# Patient Record
Sex: Female | Born: 1950 | Race: White | Hispanic: No | Marital: Married | Smoking: Current every day smoker
Health system: Southern US, Community
[De-identification: ages and names within clinical notes are randomized; demographics above are authoritative.]

## PROBLEM LIST (undated history)

## (undated) DIAGNOSIS — IMO0001 Reserved for inherently not codable concepts without codable children: Secondary | ICD-10-CM

## (undated) DIAGNOSIS — E785 Hyperlipidemia, unspecified: Secondary | ICD-10-CM

## (undated) DIAGNOSIS — G9340 Encephalopathy, unspecified: Secondary | ICD-10-CM

## (undated) DIAGNOSIS — J449 Chronic obstructive pulmonary disease, unspecified: Secondary | ICD-10-CM

## (undated) DIAGNOSIS — J45909 Unspecified asthma, uncomplicated: Secondary | ICD-10-CM

## (undated) DIAGNOSIS — N390 Urinary tract infection, site not specified: Secondary | ICD-10-CM

## (undated) DIAGNOSIS — S72001A Fracture of unspecified part of neck of right femur, initial encounter for closed fracture: Secondary | ICD-10-CM

## (undated) DIAGNOSIS — I10 Essential (primary) hypertension: Secondary | ICD-10-CM

## (undated) DIAGNOSIS — J40 Bronchitis, not specified as acute or chronic: Secondary | ICD-10-CM

## (undated) DIAGNOSIS — J439 Emphysema, unspecified: Secondary | ICD-10-CM

## (undated) HISTORY — PX: APPENDECTOMY: SHX54

## (undated) HISTORY — PX: INSERTION / PLACEMENT / REVISION NEUROSTIMULATOR: SUR720

## (undated) HISTORY — PX: ABDOMINAL HYSTERECTOMY: SHX81

## (undated) HISTORY — PX: BACK SURGERY: SHX140

---

## 1998-09-22 ENCOUNTER — Other Ambulatory Visit: Admission: RE | Admit: 1998-09-22 | Discharge: 1998-09-22 | Payer: Self-pay | Admitting: Obstetrics and Gynecology

## 2000-01-25 ENCOUNTER — Encounter: Admission: RE | Admit: 2000-01-25 | Discharge: 2000-01-25 | Payer: Self-pay | Admitting: Obstetrics and Gynecology

## 2000-01-25 ENCOUNTER — Encounter: Payer: Self-pay | Admitting: Obstetrics and Gynecology

## 2003-10-14 ENCOUNTER — Emergency Department (HOSPITAL_COMMUNITY): Admission: EM | Admit: 2003-10-14 | Discharge: 2003-10-14 | Payer: Self-pay | Admitting: Emergency Medicine

## 2006-06-05 ENCOUNTER — Emergency Department (HOSPITAL_COMMUNITY): Admission: EM | Admit: 2006-06-05 | Discharge: 2006-06-05 | Payer: Self-pay | Admitting: Emergency Medicine

## 2006-07-29 ENCOUNTER — Encounter: Admission: RE | Admit: 2006-07-29 | Discharge: 2006-07-29 | Payer: Self-pay | Admitting: Orthopedic Surgery

## 2006-09-04 ENCOUNTER — Ambulatory Visit (HOSPITAL_COMMUNITY): Admission: RE | Admit: 2006-09-04 | Discharge: 2006-09-05 | Payer: Self-pay | Admitting: Neurosurgery

## 2006-11-12 ENCOUNTER — Emergency Department (HOSPITAL_COMMUNITY): Admission: EM | Admit: 2006-11-12 | Discharge: 2006-11-12 | Payer: Self-pay | Admitting: Emergency Medicine

## 2007-02-26 ENCOUNTER — Encounter: Admission: RE | Admit: 2007-02-26 | Discharge: 2007-02-26 | Payer: Self-pay | Admitting: Orthopedic Surgery

## 2007-03-11 ENCOUNTER — Inpatient Hospital Stay (HOSPITAL_COMMUNITY): Admission: RE | Admit: 2007-03-11 | Discharge: 2007-03-13 | Payer: Self-pay | Admitting: Neurosurgery

## 2007-06-13 ENCOUNTER — Encounter: Admission: RE | Admit: 2007-06-13 | Discharge: 2007-06-13 | Payer: Self-pay | Admitting: Neurosurgery

## 2007-12-17 ENCOUNTER — Encounter: Admission: RE | Admit: 2007-12-17 | Discharge: 2007-12-17 | Payer: Self-pay | Admitting: Internal Medicine

## 2008-07-19 ENCOUNTER — Encounter: Admission: RE | Admit: 2008-07-19 | Discharge: 2008-07-19 | Payer: Self-pay | Admitting: Orthopedic Surgery

## 2008-07-24 ENCOUNTER — Encounter: Admission: RE | Admit: 2008-07-24 | Discharge: 2008-07-24 | Payer: Self-pay | Admitting: Orthopedic Surgery

## 2008-09-16 ENCOUNTER — Encounter: Admission: RE | Admit: 2008-09-16 | Discharge: 2008-09-16 | Payer: Self-pay | Admitting: Orthopedic Surgery

## 2009-07-24 ENCOUNTER — Encounter: Admission: RE | Admit: 2009-07-24 | Discharge: 2009-07-24 | Payer: Self-pay | Admitting: Orthopedic Surgery

## 2009-08-16 ENCOUNTER — Encounter: Admission: RE | Admit: 2009-08-16 | Discharge: 2009-08-16 | Payer: Self-pay | Admitting: Neurosurgery

## 2009-11-29 ENCOUNTER — Ambulatory Visit: Payer: Self-pay | Admitting: Diagnostic Radiology

## 2009-11-29 ENCOUNTER — Emergency Department (HOSPITAL_BASED_OUTPATIENT_CLINIC_OR_DEPARTMENT_OTHER): Admission: EM | Admit: 2009-11-29 | Discharge: 2009-11-30 | Payer: Self-pay | Admitting: Emergency Medicine

## 2009-12-22 ENCOUNTER — Encounter
Admission: RE | Admit: 2009-12-22 | Discharge: 2010-03-08 | Payer: Self-pay | Source: Home / Self Care | Attending: Family Medicine | Admitting: Family Medicine

## 2010-02-27 ENCOUNTER — Encounter: Payer: Self-pay | Admitting: Internal Medicine

## 2010-03-23 ENCOUNTER — Other Ambulatory Visit: Payer: Self-pay | Admitting: Orthopaedic Surgery

## 2010-03-23 DIAGNOSIS — M549 Dorsalgia, unspecified: Secondary | ICD-10-CM

## 2010-03-25 ENCOUNTER — Ambulatory Visit
Admission: RE | Admit: 2010-03-25 | Discharge: 2010-03-25 | Disposition: A | Discharge status: Home / Self Care | Payer: PRIVATE HEALTH INSURANCE | Source: Ambulatory Visit | Attending: Orthopaedic Surgery | Admitting: Orthopaedic Surgery

## 2010-03-25 DIAGNOSIS — M549 Dorsalgia, unspecified: Secondary | ICD-10-CM

## 2010-04-20 LAB — POCT CARDIAC MARKERS
CKMB, poc: <1 ng/mL — ABNORMAL LOW (ref 1.0–8.0)
Myoglobin, poc: 71.7 ng/mL (ref 12–200)
Troponin i, poc: <0.05 ng/mL (ref 0.00–0.09)
Troponin i, poc: <0.05 ng/mL (ref 0.00–0.09)

## 2010-04-20 LAB — CBC
HCT: 48.2 % — ABNORMAL HIGH (ref 36.0–46.0)
MCH: 31.8 pg (ref 26.0–34.0)
RDW: 12.7 % (ref 11.5–15.5)
WBC: 11.6 10*3/uL — ABNORMAL HIGH (ref 4.0–10.5)

## 2010-04-20 LAB — DIFFERENTIAL
Eosinophils Absolute: 0 10*3/uL (ref 0.0–0.7)
Eosinophils Relative: 0 % (ref 0–5)
Lymphocytes Relative: 5 % — ABNORMAL LOW (ref 12–46)
Lymphs Abs: 0.5 10*3/uL — ABNORMAL LOW (ref 0.7–4.0)
Neutrophils Relative %: 92 % — ABNORMAL HIGH (ref 43–77)

## 2010-04-20 LAB — BASIC METABOLIC PANEL
GFR calc Af Amer: >60 mL/min (ref >60)
GFR calc non Af Amer: >60 mL/min (ref >60)
Sodium: 134 mEq/L — ABNORMAL LOW (ref 135–145)

## 2010-04-20 LAB — D-DIMER, QUANTITATIVE: D-Dimer, Quant: 0.31 ug/mL-FEU (ref 0.00–0.48)

## 2010-06-06 ENCOUNTER — Other Ambulatory Visit: Payer: Self-pay | Admitting: Orthopaedic Surgery

## 2010-06-06 ENCOUNTER — Ambulatory Visit
Admission: RE | Admit: 2010-06-06 | Discharge: 2010-06-06 | Disposition: A | Discharge status: Home / Self Care | Payer: Medicare Other | Source: Ambulatory Visit | Attending: Orthopaedic Surgery | Admitting: Orthopaedic Surgery

## 2010-06-06 DIAGNOSIS — R609 Edema, unspecified: Secondary | ICD-10-CM

## 2010-06-21 NOTE — Op Note (Signed)
NAMEJOANMARIE, TSANG              ACCOUNT NO.:  [id number]   MEDICAL RECORD NO.:  [id number]          PATIENT TYPE:  INP   LOCATION:  3037                         FACILITY:  [facility]   PHYSICIAN:  Kathaleen Maser. Pool, M.D.    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   PROCEDURE NOTE:  L2-3 redo laminectomy with bilateral L2 and L3  decompressive foraminotomies, more than would be required for simple  interbody fusion alone.  L2-3 posterior lumbar interbody fusion  utilizing tangent interbody allograft wedge, Telamon interbody PEEK cage  and local autografting.  L2-3 posterolateral arthrodesis utilizing  nonsegmental pedicle screw fixation and local autograft.   SURGEON:  Kathaleen Maser. Pool, M.D.   ASSISTANT:  Donalee Citrin, M.D.   ANESTHESIA:  General endotracheal.   INDICATIONS:  Ms. Bruhl is a 61 year old female with history previous  L2-3 laminotomy and diskectomy, presents with worsening back and lower  extremity pain.  Workup demonstrates disk space collapse with some  degree of rotatory scoliosis and marked foraminal stenosis.  The patient  presents now for L2-3 decompression and fusion.   OPERATIVE NOTE:  The patient was taken to the operating room where she  was placed in the operating table in the supine position.  After  adequate level of anesthesia was achieved, the patient was positioned  prone onto Wilson frame, appropriately padded.  The patient's lumbar  region was prepped and draped sterilely. A 10 blade was used to make a  linear skin incision overlying the L2-3 interspace.  This was carried  sharply in the midline. Subperiosteal dissection was performed exposing  the lamina and facet joints of L2 and L3 as well as transverse  processes  of L2 and L3.  Deep self-retaining was placed.  Intraoperative  fluoroscopy was used, and levels were confirmed.  Laminotomy at L2-3 was  explored and widened.  Complete laminectomy of L2 was then performed.  The inferior facetectomies of L2 were performed bilaterally.  Superior  facetectomies at L3 were performed bilaterally and superior laminectomy  of L3 was performed.  All bone was cleaned and used in later  autografting.  Ligament flavum and epidural scar was resected.  Underlying thecal sac and exiting L2 and L3 nerve roots were identified.  Wide decompressive foraminotomies were performed along all the course of  their exiting nerve roots.  Bilateral diskectomies at L2-3 were then  performed.  Disk space was then sequentially dilated up to 8 mm.  With  the distractor on the left side, thecal sac and nerve roots were  retracted on the right side.  Disk space was then reamed and cut with 8-  mm tangent instruments.  Soft tissues were then removed from the  interspace.  A 8 x 22-mm Telamon cage packed with morselized autograft  and Progenix putty was then packed into place and recessed approximately  1 mm from the posterior  vertical margin.  Distractors were removed from  the patient's contralateral side.  The thecal sac and nerve roots were  protected on that side.  Disk space was once again reamed and cut with 8-  mm tangent instruments.  Soft tissues were removed from the interspace.  Morselized autograft packed with Progenix putty was then packed in the  interspace.  An 8 x 26-mm tangent wedge was then impacted into place and  recessed roughly 2 mm from the posterior cortical margin.  Pedicles of  L2 and L3 were then identified using superficial landmarks and  intraoperative fluoroscopy.  Superficial bone around the pedicle was  then removed using high-speed drill.  Each pedicle was then probed using  the pedicle awl.  Each pedicle awl track was then tapped with a  5.25-mm  screw tapper.  Each screw tap hole was probed and found to be solid  within bone.  The 6.75 radius screws were placed bilaterally at L2 and  L3.  All screws were found be well positioned.  Transverse processes of  L2 and L3 were then decorticated using a high-speed drill.  Morselized  autograft was packed posterolaterally for later fusion.  Short segment  titanium rods were then placed over screw heads at L2 and L3.  Locking  caps were then placed over screw heads.  Locking caps were then engaged  with construct under compression.  Final images revealed good position  of bone grafts and hardware at proper operative level and normal  alignment of the spine.  Wound was then inspected one final time.  There  was no injury to thecal sac or  nerve roots.  Gelfoam was placed  topically for hemostasis and found to be good.  Retractor system was  removed.  Hemostasis was achieved with electrocautery.  A medium Hemovac  drain was left in the epidural space.  Wounds were then closed in layers  with Vicryl suture.  Steri-Strips and sterile dressing were applied.  There were no complications.  The patient tolerated the procedure well,  and she returned to the recovery room for postoperative care.           ______________________________  Kathaleen Maser. Pool, M.D.     HAP/MEDQ  D:  04/02/2007  T:  04/02/2007  Job:  [id number]

## 2010-06-21 NOTE — Op Note (Signed)
[name], [name]              ACCOUNT NO.:  [id number]   MEDICAL RECORD NO.:  [id number]          PATIENT TYPE:  OIB   LOCATION:  3038                         FACILITY:  [facility]   PHYSICIAN:  Kathaleen Maser. Pool, M.D.    DATE OF BIRTH:  01-31-1951   DATE OF PROCEDURE:  09/04/2006  DATE OF DISCHARGE:                               OPERATIVE REPORT   PREOPERATIVE DIAGNOSES:  Left L2-3 herniated nucleus pulposus with  radiculopathy.   POSTOPERATIVE DIAGNOSES:  Left L2-3 herniated nucleus pulposus with  radiculopathy.   OPERATION PERFORMED:  Left L2-3 laminotomy and microdiskectomy.   SURGEON:  Kathaleen Maser. Pool, M.D.   ASSISTANT:  Reinaldo Meeker, M.D.   ANESTHESIA:  General endotracheal.   INDICATIONS FOR PROCEDURE:  [name] is a 60 year old female with  history of back and left lower extremity pain consistent with a left-  sided L3 radiculopathy which has failed conservative management.  Work-  up demonstrates evidence of a left-sided L2-3 disk herniation with an  inferior free fragment causing marked compression of the left-sided L3  nerve root.  Patient has been counseled as to her options.  She has  decided to proceed with a left-sided L2-3 laminotomy and microdiskectomy  in hopes of improving her symptoms.   DESCRIPTION OF PROCEDURE:  The patient was taken to the operating room  and placed on the table in the supine position.  After adequate level of  anesthesia was achieved, the patient was positioned prone onto a Wilson  frame and appropriately padded.  The patient's lumbar region was prepped  and draped sterilely.  A 10 blade was used to make a linear skin  incision overlying the L2-3 interspace.  This was carried down sharply  in the midline.  A subperiosteal dissection was then performed exposing  the lamina and facet joints of L2 and L3 on the left side.  Deep self-  retaining retractor was placed.  Intraoperative x-ray was taken, the  level was confirmed.  The  laminotomy was then performed using high speed  drill and Kerrison rongeurs to remove the inferior edge of the lamina at  L2, medial aspect of L2-3 facet joints and the superior rim of the L3  lamina.  Ligamentum flavum was then elevated and resected in piecemeal  fashion using Kerrison rongeurs.  The underlying thecal sac and exiting  L3 nerve root were identified.  The microscope was brought into the  field and used for microdissection of the left-sided S3 nerve root and  underlying disk herniation.  The epidural venous plexus was coagulated  and cut.  A moderately large inferior free fragment of disk herniation  was encountered off to the left side at L2-3.  This was incised with a  15 blade and then dissected free using blunt nerve hooks.  This was then  fully removed pituitary rongeurs and Epstein curettes.  All elements of  disk herniation were completely resected.  The disk space itself was  reasonably flat.  There was no evidence of contained disk herniation  beneath the annulus.  For that reason, a formal annulotomy was not  done.  The spinal canal was inspected and found to be free of any residual  compression or injury.  The wound was then irrigated with antibiotic  solution.  Gelfoam was placed topically for hemostasis which was found  to be good.  Microscope and  retractor system were removed.  Hemostasis was achieved with  electrocautery.  The wound was then closed in layers with Vicryl  sutures.  Steri-Strips and sterile dressing were applied.  There were no  apparent complications.  The patient tolerated the procedure well and  she returns to the recovery room postoperatively.           ______________________________  Kathaleen Maser Pool, M.D.     HAP/MEDQ  D:  09/04/2006  T:  09/05/2006  Job:  [id number]

## 2010-06-21 NOTE — Op Note (Signed)
NAMEEULAR, PANEK              ACCOUNT NO.:  [id number]   MEDICAL RECORD NO.:  [id number]          PATIENT TYPE:  INP   LOCATION:  3037                         FACILITY:  [facility]   PHYSICIAN:  Kathaleen Maser. Pool, M.D.    DATE OF BIRTH:  05/25/1950   DATE OF PROCEDURE:  03/11/2007  DATE OF DISCHARGE:                               OPERATIVE REPORT   PREOPERATIVE DIAGNOSIS:  Left L2-3 recurrent disk herniation with  radiculopathy and instability.   POSTOPERATIVE DIAGNOSIS:  Left L2-3 recurrent disk herniation with  radiculopathy and instability.   PROCEDURE NOTE:  Re-exploration of L2-3 laminectomy with bilateral L2-3  redo decompressive laminectomy and foraminotomies, more than would be  required for simple interbody fusion alone.  L2-3 posterior lumbar  interbody fusion utilizing tangent interbody allograft wedge, Telamon  interbody PEEK cage and local autografting.  L2-3 posterolateral  arthrodesis utilizing nonsegmental pedicle screw instrumentation local  autograft.   SURGEON:  Kathaleen Maser. Pool, M.D.   ASSISTANT:  Donalee Citrin, M.D.   ANESTHESIA:  General.   INDICATIONS:  Ms. Solar is a 60 year old female history of previous  left-sided L2-3 laminotomy and microdiskectomy.  The patient presents  now with severe back and recurrent left lower extremity pain.  Workup  demonstrates evidence of marked breakdown the L2-3 motion segment with  evidence of a large leftward L2-3 recurrent disk herniation.  The  patient counseled as to her options.  She decided proceed with lumbar  decompression and fusion in hopes improving her symptoms.   OPERATIVE NOTE:  The patient was taken to the operating room, placed on  table in supine position.  Adequate level anesthesia achieved the  patient positioned prone onto Wilson frame appropriately padded.  The  patient lumbar regions prepped draped sterilely.  10 blade made linear  skin incision overlying the L2-3 interspace.  This carried sharply in  the midline.  Subperiosteal dissection performed exposing lamina facet  joints L2-L3 as well as transverse processes of L2 and L3.  Deep self-  retaining retractor placed.  Intraoperative fluoroscopy used, levels  were confirmed.  Previous laminotomy on the left-sided L2-3 was  dissected free using dental instruments and epidural scar was resected.  Complete laminectomy of L2 was then performed using Leksell rongeurs,  Kerrison rongeurs, high-speed drill to remove the entire lamina of L2  inferior facets of L2 bilaterally and superior facets of L3 bilaterally  as well as the superior aspect of the L3 lamina.  All bone was cleaned  and used in later autografting.  Ligament flavum and epidural scar was  then resected from the underlying thecal sac and nerve roots.  Wide  decompressive foraminotomies then performed on the course exiting L2-L3  nerve roots bilaterally.  Epidural venous plexus coagulated and cut.  Starting patient's left side thecal sac and nerve roots were gently  mobilized.  Epidural scar was dissected free and mobilized with dental  instruments.  A large inferior free fragment of recurrent disk  herniation encountered and completely resected.  Disk space incised with  15 blade rectangular fashion.  Wide disk space clean-out was achieved  using  pituitary rongeurs, upward angled pituitary rongeurs and Epstein  curettes.  After an aggressive diskectomy performed on the patient's  left side, the procedure then repeated on the patient's right side.  Disk space then sequentially dilated up to 9 mm with a 9 mm distractor  left in the patient's right side.  Thecal sac and nerve roots once again  protected on the left side.  Disk space then reamed and cut with 8 mm  tangent instrument.  Soft tissue was then removed from interspace.  8 x  22 mm Telamon cage packed with morselized autograft and Progenics putty  was then impacted into place recessed roughly 2 mm from posterior  cortical  margin of L2.  Distractors removed from the patient's right  side.  Thecal sac and nerve roots protected on the right side.  Disk  space once again reamed and then cut with 8 mm tangent instrument.  Soft  tissues removed from the interspace.  Disk space further curettaged.  Morselized autograft mixed with Progenics putty was then packed  interspace.  An 8 x 22 mm tangent wedge then packed into place and  recessed approximately 2 mm from posterior cortical margin of L2.  Pedicles of L2-L3 then identified using surface landmarks intraoperative  fluoroscopy.  Superficial bone on the pedicle was then removed using  high-speed drill, each pedicle hole was then probed using pedicle awl.  Each pedicle awl track was then tapped with 5.2 mmscrew tap.  Each screw  tap hole was probed and found to be solid __________  bone.  6.75 x 45  mm radius screws placed bilaterally at L2-L3.  Transverse processes of  L2 and L3 then decorticated utilizing high speed drill.  Morselized  autograft packed posterolaterally __________ fusion.  Short segment  titanium rods then placed over the screw heads at L2-L3.  Locking caps  placed over screw heads.  Locking caps then engaged with construct under  compression.  Final images revealed good position bone graft, hardware  proper operative level with normal alignment spine.  Wound then  irrigated one final time.  Hemostasis was ensured with bipolar cautery.  Gelfoam was placed topically hemostasis.  A medium Hemovac drains left  epidural space.  Wound was then closed in layers with Vicryl suture.  Steri-Strips sterile dressing were applied.  No complications.  The  patient was well and she returns recovery room postoperative.           ______________________________  Kathaleen Maser. Pool, M.D.     HAP/MEDQ  D:  03/11/2007  T:  03/12/2007  Job:  [id number]

## 2010-10-27 LAB — DIFFERENTIAL
Basophils Absolute: 0.1
Basophils Relative: 1
Neutro Abs: 5.9
Neutrophils Relative %: 66

## 2010-10-27 LAB — CBC
HCT: 46.1 — ABNORMAL HIGH
Hemoglobin: 15.7 — ABNORMAL HIGH
MCHC: 34.2
Platelets: 217
RBC: 5
RDW: 12.4
WBC: 8.9

## 2010-10-27 LAB — BASIC METABOLIC PANEL
BUN: 17
CO2: 26
GFR calc Af Amer: >60
Glucose, Bld: 89
Potassium: 4.5
Sodium: 135

## 2010-10-27 LAB — TYPE AND SCREEN

## 2010-11-21 LAB — DIFFERENTIAL
Lymphocytes Relative: 23
Lymphs Abs: 2.1
Monocytes Absolute: 0.7
Monocytes Relative: 8
Neutro Abs: 6

## 2010-11-21 LAB — TYPE AND SCREEN

## 2010-11-21 LAB — CBC
Hemoglobin: 15.2 — ABNORMAL HIGH
RBC: 4.99

## 2010-11-21 LAB — ABO/RH: ABO/RH(D): O POS

## 2011-07-02 ENCOUNTER — Encounter: Payer: Medicare Other | Admitting: Physician Assistant

## 2011-10-25 ENCOUNTER — Other Ambulatory Visit: Payer: Self-pay | Admitting: Family Medicine

## 2011-10-28 ENCOUNTER — Ambulatory Visit (INDEPENDENT_AMBULATORY_CARE_PROVIDER_SITE_OTHER): Payer: Medicare Other | Admitting: Emergency Medicine

## 2011-10-28 ENCOUNTER — Ambulatory Visit: Payer: Medicare Other

## 2011-10-28 VITALS — BP 140/78 | HR 88 | Temp 97.9°F | Resp 18 | Ht 67.75 in | Wt 218.8 lb

## 2011-10-28 DIAGNOSIS — R05 Cough: Secondary | ICD-10-CM

## 2011-10-28 DIAGNOSIS — R062 Wheezing: Secondary | ICD-10-CM

## 2011-10-28 DIAGNOSIS — R059 Cough, unspecified: Secondary | ICD-10-CM

## 2011-10-28 LAB — POCT CBC
Granulocyte percent: 72.5 %G (ref 37–80)
HCT, POC: 47.9 % (ref 37.7–47.9)
Lymph, poc: 2.4 (ref 0.6–3.4)
MCHC: 30.9 g/dL — AB (ref 31.8–35.4)
MCV: 96 fL (ref 80–97)
POC LYMPH PERCENT: 21.5 %L (ref 10–50)
RDW, POC: 13.6 %

## 2011-10-28 MED ORDER — IPRATROPIUM BROMIDE 0.02 % IN SOLN
0.5000 mg | Freq: Once | RESPIRATORY_TRACT | Status: AC
  Administered 2011-10-28: 0.5 mg via RESPIRATORY_TRACT

## 2011-10-28 MED ORDER — AZITHROMYCIN 250 MG PO TABS: ORAL_TABLET | ORAL | Status: DC

## 2011-10-28 MED ORDER — ALBUTEROL SULFATE HFA 108 (90 BASE) MCG/ACT IN AERS: 2.0000 | INHALATION_SPRAY | RESPIRATORY_TRACT | Status: DC | PRN

## 2011-10-28 MED ORDER — PREDNISONE 20 MG PO TABS: ORAL_TABLET | ORAL | Status: DC

## 2011-10-28 MED ORDER — ALBUTEROL SULFATE (2.5 MG/3ML) 0.083% IN NEBU
2.5000 mg | INHALATION_SOLUTION | Freq: Once | RESPIRATORY_TRACT | Status: AC
  Administered 2011-10-28: 2.5 mg via RESPIRATORY_TRACT

## 2011-10-28 NOTE — Progress Notes (Signed)
  Subjective:    Patient ID: [name], female    DOB: [birth year], 61 y.o.   MRN: [id number]  HPI patient here with two-week history of chest congestion. She initially started with a productive cough and was able to cough up large amounts of yellowish type phlegm. Recently however she has tightened up in her chest and has had significant difficulty with her breathing. She has smoked up to 2 packs per day now is down to about a pack and a half Has had breathing treatment in past. The treatments have always seem to help her and help to open up. .     Review of Systems she is history of previous back surgery and she currently has rods in her back. She is currently in the process of seeing if she can qualify to get a TENS unit placed.     Objective:   Physical Exam HEENT exam reveals TMs to be clear nose normal throat is clear. There is no adenopathy noted chest exam reveals increased AP diameter with bilateral wheezes noted. They're diminished breath sounds in the bases. Cardiac exam is regular rate and rhythm without murmurs rubs or gallops  Results for orders placed in visit on 10/28/11  POCT CBC      Component Value Range   WBC 11.0 (*) 4.6 - 10.2 K/uL   Lymph, poc 2.4  0.6 - 3.4   POC LYMPH PERCENT 21.5  10 - 50 %L   MID (cbc) 0.7  0 - 0.9   POC MID % 6.0  0 - 12 %M   POC Granulocyte 8.0 (*) 2 - 6.9   Granulocyte percent 72.5  37 - 80 %G   RBC 4.99  4.04 - 5.48 M/uL   Hemoglobin 14.8  12.2 - 16.2 g/dL   HCT, POC 95.2  84.1 - 47.9 %   MCV 96.0  80 - 97 fL   MCH, POC 29.7  27 - 31.2 pg   MCHC 30.9 (*) 31.8 - 35.4 g/dL   RDW, POC 32.4     Platelet Count, POC 339  142 - 424 K/uL   MPV 8.6  0 - 99.8 fL   [facility] reading (PRIMARY) by  Dr. Cleta Alberts No  Pneumonia       Assessment & Plan:  I suspect patient has underlying significant COPD. She continues to smoke heavily. She now has respiratory infection with bronchospasm. We'll give a nebulizer treatment with albuterol and Atrovent  check CBC and chest x-ray.

## 2011-11-01 NOTE — Progress Notes (Signed)
This encounter was created in error - please disregard.

## 2012-01-10 ENCOUNTER — Encounter (HOSPITAL_BASED_OUTPATIENT_CLINIC_OR_DEPARTMENT_OTHER): Payer: Self-pay | Admitting: *Deleted

## 2012-01-10 ENCOUNTER — Emergency Department (HOSPITAL_BASED_OUTPATIENT_CLINIC_OR_DEPARTMENT_OTHER)
Admission: EM | Admit: 2012-01-10 | Discharge: 2012-01-11 | Disposition: A | Discharge status: Home / Self Care | Payer: Medicare Other | Attending: Emergency Medicine | Admitting: Emergency Medicine

## 2012-01-10 ENCOUNTER — Emergency Department (HOSPITAL_BASED_OUTPATIENT_CLINIC_OR_DEPARTMENT_OTHER): Payer: Medicare Other

## 2012-01-10 DIAGNOSIS — J449 Chronic obstructive pulmonary disease, unspecified: Secondary | ICD-10-CM

## 2012-01-10 DIAGNOSIS — J45909 Unspecified asthma, uncomplicated: Secondary | ICD-10-CM | POA: Insufficient documentation

## 2012-01-10 DIAGNOSIS — Z7901 Long term (current) use of anticoagulants: Secondary | ICD-10-CM | POA: Insufficient documentation

## 2012-01-10 DIAGNOSIS — J4489 Other specified chronic obstructive pulmonary disease: Secondary | ICD-10-CM | POA: Insufficient documentation

## 2012-01-10 DIAGNOSIS — J438 Other emphysema: Secondary | ICD-10-CM | POA: Insufficient documentation

## 2012-01-10 DIAGNOSIS — Z79899 Other long term (current) drug therapy: Secondary | ICD-10-CM | POA: Insufficient documentation

## 2012-01-10 DIAGNOSIS — F17200 Nicotine dependence, unspecified, uncomplicated: Secondary | ICD-10-CM | POA: Insufficient documentation

## 2012-01-10 DIAGNOSIS — I10 Essential (primary) hypertension: Secondary | ICD-10-CM | POA: Insufficient documentation

## 2012-01-10 HISTORY — DX: Essential (primary) hypertension: I10

## 2012-01-10 HISTORY — DX: Unspecified asthma, uncomplicated: J45.909

## 2012-01-10 HISTORY — DX: Emphysema, unspecified: J43.9

## 2012-01-10 HISTORY — DX: Chronic obstructive pulmonary disease, unspecified: J44.9

## 2012-01-10 HISTORY — DX: Bronchitis, not specified as acute or chronic: J40

## 2012-01-10 MED ORDER — ALBUTEROL SULFATE (5 MG/ML) 0.5% IN NEBU
5.0000 mg | INHALATION_SOLUTION | Freq: Once | RESPIRATORY_TRACT | Status: AC
  Administered 2012-01-10: 5 mg via RESPIRATORY_TRACT
  Filled 2012-01-10: qty 1

## 2012-01-10 MED ORDER — IPRATROPIUM BROMIDE 0.02 % IN SOLN
0.5000 mg | Freq: Once | RESPIRATORY_TRACT | Status: AC
  Administered 2012-01-10: 0.5 mg via RESPIRATORY_TRACT
  Filled 2012-01-10: qty 2.5

## 2012-01-10 NOTE — ED Provider Notes (Signed)
History     CSN: [id number]  Arrival date & time 01/10/12  2223   First MD Initiated Contact with Patient 01/10/12 2313      Chief Complaint  Patient presents with  . Shortness of Breath    (Consider location/radiation/quality/duration/timing/severity/associated sxs/prior treatment) HPI Is a 61 year old female with a history of COPD. She's had a three-week history of respiratory infection for which she means nasal congestion, productive cough and shortness of breath. She has been on Avelox and finished it today. Despite this she is here with a three-day history of worsening shortness of breath and nonproductive cough which acutely worsened this evening. Symptoms are moderate to severe and worse with exertion. She has not received adequate relief with her inhalers. She denies fever or chills. She denies nausea, vomiting or diarrhea.  Past Medical History  Diagnosis Date  . COPD (chronic obstructive pulmonary disease)   . Emphysema   . Bronchitis   . Hypertension   . Asthma     Past Surgical History  Procedure Date  . Back surgery   . Abdominal hysterectomy   . Appendectomy     History reviewed. No pertinent family history.  History  Substance Use Topics  . Smoking status: Current Every Day Smoker -- 1.5 packs/day    Types: Cigarettes  . Smokeless tobacco: Not on file  . Alcohol Use: No    OB History    Grav Para Term Preterm Abortions TAB SAB Ect Mult Living                  Review of Systems  All other systems reviewed and are negative.    Allergies  Review of patient's allergies indicates no known allergies.  Home Medications   Current Outpatient Rx  Name  Route  Sig  Dispense  Refill  . ALBUTEROL SULFATE HFA 108 (90 BASE) MCG/ACT IN AERS   Inhalation   Inhale 2 puffs into the lungs every 4 (four) hours as needed.   1 Inhaler   11   . AMLODIPINE BESYLATE 2.5 MG PO TABS   Oral   Take 2.5 mg by mouth daily.         . ASPIRIN 81 MG PO TABS    Oral   Take 81 mg by mouth daily.         . AZITHROMYCIN 250 MG PO TABS      Take 2 tabs PO x 1 dose, then 1 tab PO QD x 4 days   6 tablet   0   . BUDESONIDE-FORMOTEROL FUMARATE 160-4.5 MCG/ACT IN AERO   Inhalation   Inhale 2 puffs into the lungs 2 (two) times daily.         Marland Kitchen DICLOFENAC SODIUM 75 MG PO TBEC   Oral   Take 75 mg by mouth 2 (two) times daily.         . DULOXETINE HCL 60 MG PO CPEP   Oral   Take 60 mg by mouth daily.         . OMEGA-3 FATTY ACIDS 1000 MG PO CAPS   Oral   Take 2 g by mouth daily.         Marland Kitchen GARLIC 400 MG PO TBEC   Oral   Take 400 mg by mouth.         Marland Kitchen HYDROCODONE-ACETAMINOPHEN 5-325 MG PO TABS   Oral   Take 3 tablets by mouth daily.         Marland Kitchen LISINOPRIL 20  MG PO TABS   Oral   Take 20 mg by mouth daily.         Marland Kitchen METFORMIN HCL 500 MG PO TABS   Oral   Take 500 mg by mouth daily with breakfast.         . OMEPRAZOLE 20 MG PO CPDR   Oral   Take 20 mg by mouth daily.         Marland Kitchen PREDNISONE 20 MG PO TABS      3 a day for 3 days 2 a day for 3 days 1 a day for 3 days   18 tablet   0   . SPIRIVA HANDIHALER 18 MCG IN CAPS      INHALE CONTENTS OF 1 CAPSULE EVERY DAY USING HANDIHALER   1 each   0     BP 163/74  Pulse 98  Temp 98 F (36.7 C) (Oral)  Resp 20  Ht 5\' 8"  (1.727 m)  Wt 175 lb (79.379 kg)  BMI 26.61 kg/m2  SpO2 92%  Physical Exam General: Well-developed, well-nourished female in no acute distress; appearance consistent with age of record HENT: normocephalic, atraumatic Eyes: pupils equal round and reactive to light; extraocular muscles intact Neck: supple Heart: regular rate and rhythm Lungs: Wheezing on inspiration and expiration with decreased air movement bilaterally Abdomen: soft; nondistended; nontender Extremities: No deformity; full range of motion Neurologic: Awake, alert and oriented; motor function intact in all extremities and symmetric; no facial droop Skin: Warm and  dry Psychiatric: Normal mood and affect    ED Course  Procedures (including critical care time)     MDM  Nursing notes and vitals signs, including pulse oximetry, reviewed.  Summary of this visit's results, reviewed by myself:  Labs:  No results found for this or any previous visit (from the past 24 hour(s)).  Imaging Studies: Dg Chest 2 View  01/10/2012  *RADIOLOGY REPORT*  Clinical Data: Cough.  Chest discomfort.  Hypertension.  CHEST - 2 VIEW  Comparison: 11/29/2009  Findings: Heart size and mediastinal contours are normal.  Both lungs are clear.  Chronic interstitial prominence is unchanged.  No evidence of pleural effusion.  Harrington fixation rods are now seen in the lower thoracic and lumbar spine.  IMPRESSION: No active cardiopulmonary disease.   Original Report Authenticated By: Myles Rosenthal, M.D.    12:20 AM Air movement improved, wheezing resolved, patient feels better after albuterol and Atrovent neb treatment. We will restart the patient on prednisone taper and she states this was helpful previously.         Hanley Seamen, MD 01/11/12 [phone]

## 2012-01-10 NOTE — ED Notes (Signed)
C/o cough and cold sxs x 3 weeks, just finished Avelox this AM. Pt states she has had no improvement.

## 2012-01-10 NOTE — ED Notes (Signed)
Patient transported to X-ray 

## 2012-01-11 MED ORDER — PREDNISONE 10 MG PO TABS: ORAL_TABLET | ORAL | Status: DC

## 2012-02-11 ENCOUNTER — Encounter (HOSPITAL_BASED_OUTPATIENT_CLINIC_OR_DEPARTMENT_OTHER): Payer: Self-pay | Admitting: Emergency Medicine

## 2012-02-11 ENCOUNTER — Inpatient Hospital Stay (HOSPITAL_BASED_OUTPATIENT_CLINIC_OR_DEPARTMENT_OTHER)
Admission: EM | Admit: 2012-02-11 | Discharge: 2012-02-13 | DRG: 189 | Disposition: A | Discharge status: Home / Self Care | Payer: PRIVATE HEALTH INSURANCE | Attending: Internal Medicine | Admitting: Internal Medicine

## 2012-02-11 ENCOUNTER — Emergency Department (HOSPITAL_BASED_OUTPATIENT_CLINIC_OR_DEPARTMENT_OTHER): Payer: PRIVATE HEALTH INSURANCE

## 2012-02-11 DIAGNOSIS — E669 Obesity, unspecified: Secondary | ICD-10-CM | POA: Diagnosis present

## 2012-02-11 DIAGNOSIS — F17200 Nicotine dependence, unspecified, uncomplicated: Secondary | ICD-10-CM | POA: Diagnosis present

## 2012-02-11 DIAGNOSIS — Z7982 Long term (current) use of aspirin: Secondary | ICD-10-CM

## 2012-02-11 DIAGNOSIS — I10 Essential (primary) hypertension: Secondary | ICD-10-CM | POA: Diagnosis present

## 2012-02-11 DIAGNOSIS — D72829 Elevated white blood cell count, unspecified: Secondary | ICD-10-CM | POA: Diagnosis present

## 2012-02-11 DIAGNOSIS — Z981 Arthrodesis status: Secondary | ICD-10-CM

## 2012-02-11 DIAGNOSIS — J441 Chronic obstructive pulmonary disease with (acute) exacerbation: Secondary | ICD-10-CM | POA: Diagnosis present

## 2012-02-11 DIAGNOSIS — R0902 Hypoxemia: Secondary | ICD-10-CM | POA: Diagnosis present

## 2012-02-11 DIAGNOSIS — E876 Hypokalemia: Secondary | ICD-10-CM | POA: Diagnosis present

## 2012-02-11 DIAGNOSIS — J9600 Acute respiratory failure, unspecified whether with hypoxia or hypercapnia: Principal | ICD-10-CM | POA: Diagnosis present

## 2012-02-11 DIAGNOSIS — Z79899 Other long term (current) drug therapy: Secondary | ICD-10-CM

## 2012-02-11 DIAGNOSIS — J9601 Acute respiratory failure with hypoxia: Secondary | ICD-10-CM | POA: Diagnosis present

## 2012-02-11 DIAGNOSIS — R51 Headache: Secondary | ICD-10-CM | POA: Diagnosis present

## 2012-02-11 DIAGNOSIS — M129 Arthropathy, unspecified: Secondary | ICD-10-CM | POA: Diagnosis present

## 2012-02-11 DIAGNOSIS — R000 Tachycardia, unspecified: Secondary | ICD-10-CM | POA: Diagnosis present

## 2012-02-11 MED ORDER — TOBRAMYCIN 0.3 % OP SOLN
2.0000 [drp] | Freq: Three times a day (TID) | OPHTHALMIC | Status: DC
  Administered 2012-02-11 – 2012-02-13 (×5): 2 [drp] via OPHTHALMIC
  Filled 2012-02-11: qty 5

## 2012-02-11 MED ORDER — IPRATROPIUM BROMIDE 0.02 % IN SOLN
0.5000 mg | Freq: Four times a day (QID) | RESPIRATORY_TRACT | Status: DC
  Administered 2012-02-11 – 2012-02-13 (×5): 0.5 mg via RESPIRATORY_TRACT
  Filled 2012-02-11 (×6): qty 2.5

## 2012-02-11 MED ORDER — OXYCODONE HCL 5 MG PO TABS
10.0000 mg | ORAL_TABLET | ORAL | Status: DC | PRN
  Administered 2012-02-11 – 2012-02-13 (×8): 10 mg via ORAL
  Filled 2012-02-11 (×8): qty 2

## 2012-02-11 MED ORDER — GABAPENTIN 300 MG PO CAPS
300.0000 mg | ORAL_CAPSULE | Freq: Every day | ORAL | Status: DC
  Administered 2012-02-11 – 2012-02-12 (×2): 300 mg via ORAL
  Filled 2012-02-11 (×3): qty 1

## 2012-02-11 MED ORDER — LISINOPRIL 20 MG PO TABS
20.0000 mg | ORAL_TABLET | Freq: Every day | ORAL | Status: DC
  Administered 2012-02-11 – 2012-02-13 (×3): 20 mg via ORAL
  Filled 2012-02-11 (×3): qty 1

## 2012-02-11 MED ORDER — ASPIRIN EC 81 MG PO TBEC
81.0000 mg | DELAYED_RELEASE_TABLET | Freq: Every day | ORAL | Status: DC
  Administered 2012-02-11 – 2012-02-13 (×3): 81 mg via ORAL
  Filled 2012-02-11 (×3): qty 1

## 2012-02-11 MED ORDER — ASPIRIN 81 MG PO TABS: 81.0000 mg | ORAL_TABLET | Freq: Every day | ORAL | Status: DC

## 2012-02-11 MED ORDER — ALBUTEROL SULFATE (5 MG/ML) 0.5% IN NEBU
INHALATION_SOLUTION | RESPIRATORY_TRACT | Status: AC
  Filled 2012-02-11: qty 0.5

## 2012-02-11 MED ORDER — ALBUTEROL (5 MG/ML) CONTINUOUS INHALATION SOLN
10.0000 mg/h | INHALATION_SOLUTION | RESPIRATORY_TRACT | Status: DC
  Administered 2012-02-11: 10 mg/h via RESPIRATORY_TRACT
  Filled 2012-02-11: qty 20

## 2012-02-11 MED ORDER — IPRATROPIUM BROMIDE 0.02 % IN SOLN
0.5000 mg | Freq: Once | RESPIRATORY_TRACT | Status: AC
  Administered 2012-02-11: 0.5 mg via RESPIRATORY_TRACT
  Filled 2012-02-11: qty 2.5

## 2012-02-11 MED ORDER — ALBUTEROL SULFATE (5 MG/ML) 0.5% IN NEBU
5.0000 mg | INHALATION_SOLUTION | Freq: Once | RESPIRATORY_TRACT | Status: AC
  Administered 2012-02-11: 5 mg via RESPIRATORY_TRACT
  Filled 2012-02-11: qty 1

## 2012-02-11 MED ORDER — METHOCARBAMOL 500 MG PO TABS
500.0000 mg | ORAL_TABLET | Freq: Three times a day (TID) | ORAL | Status: DC
  Administered 2012-02-11 – 2012-02-13 (×5): 500 mg via ORAL
  Filled 2012-02-11 (×7): qty 1

## 2012-02-11 MED ORDER — LEVOFLOXACIN IN D5W 500 MG/100ML IV SOLN
500.0000 mg | INTRAVENOUS | Status: DC
  Administered 2012-02-11 – 2012-02-12 (×2): 500 mg via INTRAVENOUS
  Filled 2012-02-11 (×3): qty 100

## 2012-02-11 MED ORDER — DULOXETINE HCL 60 MG PO CPEP
60.0000 mg | ORAL_CAPSULE | Freq: Every day | ORAL | Status: DC
  Administered 2012-02-11 – 2012-02-13 (×3): 60 mg via ORAL
  Filled 2012-02-11 (×3): qty 1

## 2012-02-11 MED ORDER — ZOLPIDEM TARTRATE 5 MG PO TABS: 10.0000 mg | ORAL_TABLET | Freq: Every evening | ORAL | Status: DC | PRN

## 2012-02-11 MED ORDER — LEVALBUTEROL HCL 0.63 MG/3ML IN NEBU
0.6300 mg | INHALATION_SOLUTION | RESPIRATORY_TRACT | Status: DC | PRN
Start: 2012-02-11 — End: 2012-02-13
  Filled 2012-02-11: qty 3

## 2012-02-11 MED ORDER — AMLODIPINE BESYLATE 10 MG PO TABS
10.0000 mg | ORAL_TABLET | Freq: Every day | ORAL | Status: DC
Start: 2012-02-11 — End: 2012-02-13
  Administered 2012-02-11 – 2012-02-13 (×3): 10 mg via ORAL
  Filled 2012-02-11 (×3): qty 1

## 2012-02-11 MED ORDER — METHYLPREDNISOLONE SODIUM SUCC 125 MG IJ SOLR
60.0000 mg | Freq: Every day | INTRAMUSCULAR | Status: DC
  Administered 2012-02-11 – 2012-02-12 (×2): 60 mg via INTRAVENOUS
  Filled 2012-02-11 (×2): qty 0.96

## 2012-02-11 MED ORDER — ENOXAPARIN SODIUM 40 MG/0.4ML ~~LOC~~ SOLN
40.0000 mg | SUBCUTANEOUS | Status: DC
  Administered 2012-02-12 – 2012-02-13 (×2): 40 mg via SUBCUTANEOUS
  Filled 2012-02-11 (×4): qty 0.4

## 2012-02-11 MED ORDER — SIMVASTATIN 20 MG PO TABS
20.0000 mg | ORAL_TABLET | Freq: Every day | ORAL | Status: DC
  Administered 2012-02-11 – 2012-02-12 (×2): 20 mg via ORAL
  Filled 2012-02-11 (×3): qty 1

## 2012-02-11 MED ORDER — BUDESONIDE-FORMOTEROL FUMARATE 160-4.5 MCG/ACT IN AERO
2.0000 | INHALATION_SPRAY | Freq: Two times a day (BID) | RESPIRATORY_TRACT | Status: DC
  Administered 2012-02-12 – 2012-02-13 (×3): 2 via RESPIRATORY_TRACT
  Filled 2012-02-11 (×2): qty 6

## 2012-02-11 MED ORDER — OXYCODONE HCL 10 MG PO TABS: 10.0000 mg | ORAL_TABLET | ORAL | Status: DC | PRN

## 2012-02-11 MED ORDER — BIOTENE DRY MOUTH MT LIQD
15.0000 mL | Freq: Two times a day (BID) | OROMUCOSAL | Status: DC
  Administered 2012-02-12 – 2012-02-13 (×2): 15 mL via OROMUCOSAL

## 2012-02-11 NOTE — Plan of Care (Signed)
Called by length, 62 year old female presented with COPD exacerbation no significant improvement after breathing treatments and steroids. She is currently tachycardiac and hypoxic, accepted to telemetry floor at [facility].    Rebeca Valdivia M.D. [facility] 02/11/2012, 5:24 PM  Pager: [phone]

## 2012-02-11 NOTE — H&P (Signed)
Chief Complaint:  Dyspnea, fever, productive cough  HPI: Patient with a history of COPD presented to ED with a shortness of breath, congestion and productive cough with thick yellow sputum. She was seen by her PCP on 02/10/2012 for the same symptoms and was prescribed Doxycycline for a sinus infection, Prednisone for COPD exacerbation, and Tobramycin opthatlmic solution for Conjunctivits. She has been taking the medications as prescribed and took a 60 mg dose of Prednisone just prior to arrival in the ED today. She continues with significant wheezing and dyspnea in spite of neb treatments. She also indicates that she has had intermittent fever at home.   Past Medical History  Diagnosis Date  . COPD (chronic obstructive pulmonary disease)   . Emphysema   . Bronchitis   . Hypertension   . Asthma     Past Surgical History  Procedure Date  . Back surgery   . Abdominal hysterectomy   . Appendectomy     No family history on file. Social History:  reports that she has been smoking Cigarettes.  She has been smoking about 1.5 packs per day. She does not have any smokeless tobacco history on file. She reports that she does not drink alcohol or use illicit drugs.  Allergies: No Known Allergies  Medications Prior to Admission  Medication Sig Dispense Refill  . albuterol (PROVENTIL HFA;VENTOLIN HFA) 108 (90 BASE) MCG/ACT inhaler Inhale 2 puffs into the lungs every 4 (four) hours as needed.  1 Inhaler  11  . amLODipine (NORVASC) 10 MG tablet Take 10 mg by mouth daily.      Marland Kitchen aspirin 81 MG tablet Take 81 mg by mouth daily.      . budesonide-formoterol (SYMBICORT) 160-4.5 MCG/ACT inhaler Inhale 2 puffs into the lungs 2 (two) times daily.      Marland Kitchen CALCIUM PO Take 1 tablet by mouth daily.      . celecoxib (CELEBREX) 200 MG capsule Take 200 mg by mouth 2 (two) times daily.      Marland Kitchen doxycycline (VIBRAMYCIN) 100 MG capsule Take 100 mg by mouth 2 (two) times daily.      . DULoxetine (CYMBALTA) 60 MG capsule  Take 60 mg by mouth daily.      . fish oil-omega-3 fatty acids 1000 MG capsule Take 2 g by mouth daily.      Marland Kitchen gabapentin (NEURONTIN) 300 MG capsule Take 300 mg by mouth at bedtime.      . Garlic (GARLIQUE) 400 MG TBEC Take 400 mg by mouth.      Marland Kitchen lisinopril (PRINIVIL,ZESTRIL) 20 MG tablet Take 20 mg by mouth daily.      . methocarbamol (ROBAXIN) 500 MG tablet Take 500 mg by mouth 3 (three) times daily.      . methylPREDNISolone acetate (DEPO-MEDROL) 80 MG/ML injection Inject 80 mg into the muscle once.      . Multiple Vitamin (MULTIVITAMIN WITH MINERALS) TABS Take 1 tablet by mouth daily.      . Oxycodone HCl 10 MG TABS Take 10 mg by mouth every 4 (four) hours as needed. For pain      . POTASSIUM PO Take 1 tablet by mouth daily.      . pravastatin (PRAVACHOL) 40 MG tablet Take 40 mg by mouth daily.      . predniSONE (DELTASONE) 20 MG tablet Take 20-40 mg by mouth daily. Take 40 mg for five days then take 20 mg for 5 days. Starting 02/10/12      . SPIRIVA HANDIHALER  18 MCG inhalation capsule INHALE CONTENTS OF 1 CAPSULE EVERY DAY USING HANDIHALER  1 each  0  . tobramycin (TOBREX) 0.3 % ophthalmic solution Place 2 drops into both eyes 3 (three) times daily.      Marland Kitchen zolpidem (AMBIEN) 10 MG tablet Take 10 mg by mouth at bedtime as needed. For insomnia        No results found for this or any previous visit (from the past 48 hour(s)). Dg Chest 2 View  02/11/2012  *RADIOLOGY REPORT*  Clinical Data: Intermittent productive cough since November, 2013. Shortness of breath.  The patient began treatment with Prednisone and Doxycycline yesterday.  CHEST - 2 VIEW  Comparison: Two-view chest x-ray 01/10/2012, 11/29/2009.  Findings: Cardiac silhouette normal in size, unchanged.  Thoracic aorta mildly atherosclerotic, unchanged.  Hilar and mediastinal contours otherwise unremarkable.  Prominent bronchovascular markings diffusely and moderate central peribronchial thickening, more so than on the prior examinations.   No confluent airspace consolidation.  No pleural effusions.  Prior thoracolumbar fusion and dorsal column stimulating device noted overlying the mid thoracic spine.  IMPRESSION: Moderate changes of acute bronchitis and/or asthma without localized airspace pneumonia.   Original Report Authenticated By: Hulan Saas, M.D.     Review of Systems  Constitutional: Positive for fever, chills and malaise/fatigue.  HENT: Positive for congestion. Negative for hearing loss, ear pain, nosebleeds and sore throat.   Eyes: Positive for pain and redness.  Respiratory: Positive for cough, sputum production, shortness of breath and wheezing. Negative for hemoptysis and stridor.   Cardiovascular: Positive for orthopnea. Negative for chest pain, palpitations, claudication and leg swelling.  Gastrointestinal: Positive for diarrhea. Negative for heartburn, nausea, vomiting, abdominal pain, constipation and blood in stool.  Genitourinary: Negative.   Musculoskeletal: Positive for back pain and joint pain. Negative for falls.  Skin: Negative for itching and rash.  Neurological: Positive for headaches. Negative for dizziness, tingling, tremors and focal weakness.  Endo/Heme/Allergies: Negative.   Psychiatric/Behavioral: Negative.   All other systems reviewed and are negative.    Blood pressure 154/82, pulse 112, temperature 97.4 F (36.3 C), temperature source Oral, resp. rate 24, height 5' 7.5" (1.715 m), weight 83.915 kg (185 lb), SpO2 95.00%. Physical Exam  Constitutional: She is oriented to person, place, and time. She appears well-developed and well-nourished.  HENT:  Head: Normocephalic and atraumatic.  Mouth/Throat: Oropharynx is clear and moist.       Tenderness to palpation of frontal sinuses  Eyes: Right conjunctiva is injected. Left conjunctiva is injected.  Cardiovascular: Normal rate, regular rhythm, normal heart sounds and intact distal pulses.   Respiratory: Accessory muscle usage present. She  has wheezes.  GI: Soft. Bowel sounds are normal. She exhibits no distension. There is no tenderness.  Musculoskeletal: Normal range of motion. She exhibits tenderness. She exhibits no edema.       Left shoulder: She exhibits pain.  Neurological: She is alert and oriented to person, place, and time. No cranial nerve deficit.  Skin: Skin is warm and dry. No rash noted. No erythema.  Psychiatric: She has a normal mood and affect. Her behavior is normal. Judgment and thought content normal.    LAB: pending  Assessment/Plan   COPD exacerbation with outpatient treatment failure. Admit for inpatient management with IV Solumedrol, prn nebulizer therapy and antibiotic therapy with IV Levaquin.   Hypertension: continue Norvasc and Lisinopril   Pain control for arthritis: oxycodone 10 mg every 4 hours prn.   FEN:  Saline lock IV, heart healthy diet,  electrolytes monitored and replaced as indicated.   DVT prophylaxis: Lovenox 40 mg daily   Ethics:  Full code.    Taleah Bellantoni A. 02/11/2012, 10:02 PM

## 2012-02-11 NOTE — ED Notes (Signed)
Pt asleep. CAT running no distress noted.

## 2012-02-11 NOTE — Progress Notes (Signed)
Pt complaining of back pain;states she has had extensive back surgery and sees a doctor for pain management.  Pt requesting her Oxycontin and Oxycodone. States she has had no pain med today.  Paged Dr. Adela Glimpse who said she will put in orders for pain med after pharmacy finishes med rec.  Pharmacy tech in room now doing med reconciliation.

## 2012-02-11 NOTE — ED Provider Notes (Signed)
History     CSN: [id number]  Arrival date & time 02/11/12  1102   First MD Initiated Contact with Patient 02/11/12 1213      Chief Complaint  Patient presents with  . Cough  . Conjunctivitis    (Consider location/radiation/quality/duration/timing/severity/associated sxs/prior treatment) HPI Comments: Patient with a history of COPD presents today with a chief complaint of productive cough, congestion, and shortness of breath.  Symptoms have been present for approximately 3 weeks.  Symptoms gradually worsening.  She reports that she was seen by her PCP yesterday for the same symptoms.  At that time her PCP prescribed her Doxycycline for a sinus infection, Prednisone for COPD exacerbation, and Tobramycin opthatlmic solution for Conjunctivits.  Patient has been taking medication as prescribed.  However, she does not feel that her symptoms have improved.  She took a 60 mg dose of Prednisone just prior to arrival in the ED today. She currently smokes 1.5 ppd.    Patient is a 62 y.o. female presenting with cough. The history is provided by the patient.  Cough There has been no fever. Associated symptoms include rhinorrhea and shortness of breath. Pertinent negatives include no chest pain and no chills.    Past Medical History  Diagnosis Date  . COPD (chronic obstructive pulmonary disease)   . Emphysema   . Bronchitis   . Hypertension   . Asthma     Past Surgical History  Procedure Date  . Back surgery   . Abdominal hysterectomy   . Appendectomy     No family history on file.  History  Substance Use Topics  . Smoking status: Current Every Day Smoker -- 1.5 packs/day    Types: Cigarettes  . Smokeless tobacco: Not on file  . Alcohol Use: No    OB History    Grav Para Term Preterm Abortions TAB SAB Ect Mult Living                  Review of Systems  Constitutional: Negative for chills.  HENT: Positive for congestion, rhinorrhea and sinus pressure.   Respiratory: Positive  for cough, chest tightness and shortness of breath.   Cardiovascular: Negative for chest pain and leg swelling.  Gastrointestinal: Negative for nausea and vomiting.  Skin: Negative for rash.  All other systems reviewed and are negative.    Allergies  Review of patient's allergies indicates no known allergies.  Home Medications   Current Outpatient Rx  Name  Route  Sig  Dispense  Refill  . DOXYCYCLINE HYCLATE 100 MG PO CAPS   Oral   Take 100 mg by mouth 2 (two) times daily.         . TOBRAMYCIN SULFATE 0.3 % OP SOLN   Both Eyes   Place 2 drops into both eyes 3 (three) times daily.         . ALBUTEROL SULFATE HFA 108 (90 BASE) MCG/ACT IN AERS   Inhalation   Inhale 2 puffs into the lungs every 4 (four) hours as needed.   1 Inhaler   11   . AMLODIPINE BESYLATE 2.5 MG PO TABS   Oral   Take 2.5 mg by mouth daily.         . ASPIRIN 81 MG PO TABS   Oral   Take 81 mg by mouth daily.         . AZITHROMYCIN 250 MG PO TABS      Take 2 tabs PO x 1 dose, then 1 tab PO  QD x 4 days   6 tablet   0   . BUDESONIDE-FORMOTEROL FUMARATE 160-4.5 MCG/ACT IN AERO   Inhalation   Inhale 2 puffs into the lungs 2 (two) times daily.         Marland Kitchen DICLOFENAC SODIUM 75 MG PO TBEC   Oral   Take 75 mg by mouth 2 (two) times daily.         . DULOXETINE HCL 60 MG PO CPEP   Oral   Take 60 mg by mouth daily.         . OMEGA-3 FATTY ACIDS 1000 MG PO CAPS   Oral   Take 2 g by mouth daily.         Marland Kitchen GARLIC 400 MG PO TBEC   Oral   Take 400 mg by mouth.         Marland Kitchen HYDROCODONE-ACETAMINOPHEN 5-325 MG PO TABS   Oral   Take 3 tablets by mouth daily.         Marland Kitchen LISINOPRIL 20 MG PO TABS   Oral   Take 20 mg by mouth daily.         Marland Kitchen METFORMIN HCL 500 MG PO TABS   Oral   Take 500 mg by mouth daily with breakfast.         . OMEPRAZOLE 20 MG PO CPDR   Oral   Take 20 mg by mouth daily.         Marland Kitchen PREDNISONE 10 MG PO TABS      Take 3 tablets daily for 3 days, then 2  tablets daily for 3 days, then one tablet daily for 3 days.   18 tablet   0   . PREDNISONE 20 MG PO TABS      3 a day for 3 days 2 a day for 3 days 1 a day for 3 days   18 tablet   0   . SPIRIVA HANDIHALER 18 MCG IN CAPS      INHALE CONTENTS OF 1 CAPSULE EVERY DAY USING HANDIHALER   1 each   0     BP 150/76  Pulse 95  Temp 98.4 F (36.9 C) (Oral)  Resp 20  Ht 5' 7.5" (1.715 m)  Wt 185 lb (83.915 kg)  BMI 28.55 kg/m2  SpO2 88%  Physical Exam  Nursing note and vitals reviewed. Constitutional: She appears well-developed and well-nourished. No distress.  HENT:  Head: Normocephalic and atraumatic.  Right Ear: Tympanic membrane and ear canal normal.  Left Ear: Tympanic membrane and ear canal normal.  Nose: Mucosal edema present. Right sinus exhibits maxillary sinus tenderness and frontal sinus tenderness. Left sinus exhibits maxillary sinus tenderness and frontal sinus tenderness.  Mouth/Throat: Uvula is midline, oropharynx is clear and moist and mucous membranes are normal.  Neck: Normal range of motion. Neck supple.  Cardiovascular: Normal rate, regular rhythm and normal heart sounds.   Pulmonary/Chest: Effort normal. No accessory muscle usage. Not tachypneic. No respiratory distress. She has decreased breath sounds. She has wheezes. She has rhonchi.  Musculoskeletal: Normal range of motion.  Neurological: She is alert.  Skin: Skin is warm and dry. She is not diaphoretic.  Psychiatric: She has a normal mood and affect.    ED Course  Procedures (including critical care time)  Labs Reviewed - No data to display Dg Chest 2 View  02/11/2012  *RADIOLOGY REPORT*  Clinical Data: Intermittent productive cough since November, 2013. Shortness of breath.  The patient began treatment with  Prednisone and Doxycycline yesterday.  CHEST - 2 VIEW  Comparison: Two-view chest x-ray 01/10/2012, 11/29/2009.  Findings: Cardiac silhouette normal in size, unchanged.  Thoracic aorta mildly  atherosclerotic, unchanged.  Hilar and mediastinal contours otherwise unremarkable.  Prominent bronchovascular markings diffusely and moderate central peribronchial thickening, more so than on the prior examinations.  No confluent airspace consolidation.  No pleural effusions.  Prior thoracolumbar fusion and dorsal column stimulating device noted overlying the mid thoracic spine.  IMPRESSION: Moderate changes of acute bronchitis and/or asthma without localized airspace pneumonia.   Original Report Authenticated By: Hulan Saas, M.D.      No diagnosis found.  Reassessed patient after first breathing treatment.  She reports that her symptoms have improved somewhat, but she continues to feel short of breath.  Lungs with diffuse wheezing and decreased breath sounds.  Will order another breathing treatment.  Reassessed patient after second breathing treatment.  She reports that she continues to feel short of breath, but feels that her chest does not feel as tight as it did initially.  Lungs continue to have diffuse wheezing and decreased breath sounds.  Will order a continuous hour long treatment.    Reassessed patient after hour long treatment.  Patient continues to feel short of breath.  Oxygen sat 90 on RA  Patient desated to 81 with ambulation.  Will consult [facility] and admit the patient.    Discussed patient with Dr. Isidoro Donning with [facility].  She has agreed to admit the patient.     MDM  Patient with history of COPD presenting with SOB, wheezing, and cough.  Patient given two 5mg /0.5 mg breathing treatments and an hour long continuous neb.  She took 60 mg Prednisone just pta.  She continued to be hypoxic.  Patient therefore, admitted for further management of COPD exacerbation.          Pascal Lux Chester, PA-C 02/11/12 2237

## 2012-02-11 NOTE — ED Notes (Signed)
Upon entering room pt was on RA.  Spo2 was 86% resting HR 124, Upon standing HR increased to 131 & Spo2 dropped to 81%.  Pt states "i feel woozy"

## 2012-02-11 NOTE — ED Notes (Addendum)
Intermittent productive cough since November.  Sx. Increased a couple of days before Christmas.  Seen at [facility] yesterday and dx. with bronchitis and conjunctivitis. Pt. started prednisone, Doxy and Tobramycin eye gtts.  C/o chills, nausea and diarrhea x 1 today. Denies fever.

## 2012-02-12 ENCOUNTER — Encounter (HOSPITAL_COMMUNITY): Payer: Self-pay | Admitting: Internal Medicine

## 2012-02-12 DIAGNOSIS — J9601 Acute respiratory failure with hypoxia: Secondary | ICD-10-CM | POA: Diagnosis present

## 2012-02-12 DIAGNOSIS — J9600 Acute respiratory failure, unspecified whether with hypoxia or hypercapnia: Principal | ICD-10-CM

## 2012-02-12 DIAGNOSIS — J441 Chronic obstructive pulmonary disease with (acute) exacerbation: Secondary | ICD-10-CM

## 2012-02-12 DIAGNOSIS — R000 Tachycardia, unspecified: Secondary | ICD-10-CM

## 2012-02-12 DIAGNOSIS — I10 Essential (primary) hypertension: Secondary | ICD-10-CM

## 2012-02-12 DIAGNOSIS — R0902 Hypoxemia: Secondary | ICD-10-CM

## 2012-02-12 LAB — CBC WITH DIFFERENTIAL/PLATELET
Eosinophils Relative: 0 % (ref 0–5)
HCT: 41.7 % (ref 36.0–46.0)
Lymphocytes Relative: 10 % — ABNORMAL LOW (ref 12–46)
Lymphs Abs: 1.2 10*3/uL (ref 0.7–4.0)
MCV: 86.5 fL (ref 78.0–100.0)
Monocytes Absolute: 0.7 10*3/uL (ref 0.1–1.0)
Neutro Abs: 9.9 10*3/uL — ABNORMAL HIGH (ref 1.7–7.7)
RBC: 4.82 MIL/uL (ref 3.87–5.11)
WBC: 11.8 10*3/uL — ABNORMAL HIGH (ref 4.0–10.5)

## 2012-02-12 LAB — BASIC METABOLIC PANEL
BUN: 9 mg/dL (ref 6–23)
Calcium: 9.4 mg/dL (ref 8.4–10.5)
GFR calc Af Amer: >90 mL/min (ref >90)
GFR calc non Af Amer: >90 mL/min (ref >90)
Glucose, Bld: 145 mg/dL — ABNORMAL HIGH (ref 70–99)
Potassium: 4.4 mEq/L (ref 3.5–5.1)
Sodium: 133 mEq/L — ABNORMAL LOW (ref 135–145)

## 2012-02-12 LAB — COMPREHENSIVE METABOLIC PANEL
ALT: 11 U/L (ref 0–35)
AST: 15 U/L (ref 0–37)
CO2: 27 mEq/L (ref 19–32)
Calcium: 9.8 mg/dL (ref 8.4–10.5)
Chloride: 95 mEq/L — ABNORMAL LOW (ref 96–112)
Creatinine, Ser: 0.53 mg/dL (ref 0.50–1.10)
GFR calc Af Amer: >90 mL/min (ref >90)
GFR calc non Af Amer: >90 mL/min (ref >90)
Glucose, Bld: 129 mg/dL — ABNORMAL HIGH (ref 70–99)
Sodium: 132 mEq/L — ABNORMAL LOW (ref 135–145)
Total Bilirubin: 0.2 mg/dL — ABNORMAL LOW (ref 0.3–1.2)

## 2012-02-12 LAB — CBC
HCT: 41.4 % (ref 36.0–46.0)
Hemoglobin: 14.2 g/dL (ref 12.0–15.0)
MCH: 30 pg (ref 26.0–34.0)
MCHC: 34.3 g/dL (ref 30.0–36.0)
RDW: 12.9 % (ref 11.5–15.5)

## 2012-02-12 LAB — TSH: TSH: 0.183 u[IU]/mL — ABNORMAL LOW (ref 0.350–4.500)

## 2012-02-12 MED ORDER — POTASSIUM CHLORIDE CRYS ER 20 MEQ PO TBCR: 40.0000 meq | EXTENDED_RELEASE_TABLET | Freq: Once | ORAL | Status: DC

## 2012-02-12 MED ORDER — GI COCKTAIL ~~LOC~~
30.0000 mL | Freq: Once | ORAL | Status: AC
  Administered 2012-02-12: 30 mL via ORAL
  Filled 2012-02-12: qty 30

## 2012-02-12 MED ORDER — POTASSIUM CHLORIDE CRYS ER 20 MEQ PO TBCR
40.0000 meq | EXTENDED_RELEASE_TABLET | Freq: Once | ORAL | Status: AC
  Administered 2012-02-12: 40 meq via ORAL
  Filled 2012-02-12: qty 2

## 2012-02-12 MED ORDER — PREDNISONE 20 MG PO TABS
40.0000 mg | ORAL_TABLET | Freq: Every day | ORAL | Status: DC
  Administered 2012-02-13: 40 mg via ORAL
  Filled 2012-02-12 (×2): qty 2

## 2012-02-12 MED ORDER — SODIUM CHLORIDE 0.9 % IV SOLN
INTRAVENOUS | Status: AC
  Administered 2012-02-12: 04:00:00 via INTRAVENOUS

## 2012-02-12 NOTE — Progress Notes (Signed)
O2 removed, sats dropped to 92%, after approx sats down to 89%, denies SOB.

## 2012-02-12 NOTE — Progress Notes (Signed)
[facility] PROGRESS NOTE  [name] NWG:[id number] DOB: Apr 30, 1950 DOA: 02/11/2012 PCP: Tally Due, MD  Assessment/Plan: Acute respiratory failure with hypoxia related to COPD exacerbation. Much improved this am. Sats 97% on 2L. Will remove oxygen and check sats at rest and with ambulation.  COPD exacerbation with outpatient treatment failure. Much improved this am. Continue with IV Solumedrol at least 1 more dose. Continue nebulizer therapy and antibiotic therapy with IV Levaquin. Levaquin scheduled for 2300 will request earlier time and then switch to po starting tomorrow.  Hypertension: controled.  continue Norvasc and Lisinopril Pain control for arthritis: oxycodone 10 mg every 4 hours prn. Hypokalemia: mild. Repleted and bmet pending.   Leukocytosis. Likely related to steroids. Afebrile. Non-toxic appearing. Will monitor FEN: Saline lock IV, heart healthy diet, electrolytes monitored and replaced as indicated. DVT prophylaxis: Lovenox 40 mg daily  Code Status: full Family Communication:  Disposition Plan: home when stable hopefully this afternoon or in am   Consultants:  none  Procedures:  none  Antibiotics:  levaquin 02/11/12>>> (indicate start date, and stop date if known)  HPI/Subjective: Reports much improvement with breathing. States able to cough up mucus and "chest not so tight"  Objective: Filed Vitals:   02/11/12 2053 02/11/12 2100 02/11/12 2123 02/12/12 0500  BP:  156/71 154/82 135/69  Pulse:  110 112 97  Temp:  98.4 F (36.9 C) 97.4 F (36.3 C) 98.7 F (37.1 C)  TempSrc:   Oral   Resp:  21 24 20   Height:      Weight:      SpO2: 96% 93% 95% 97%    Intake/Output Summary (Last 24 hours) at 02/12/12 0752 Last data filed at 02/12/12 0700  Gross per 24 hour  Intake 773.75 ml  Output   1800 ml  Net -1026.25 ml   Filed Weights   02/11/12 1124  Weight: 83.915 kg (185 lb)    Exam:   General:  Well nourished, awake, alert  oriented x3  Cardiovascular: RRR No MGR No LEE PPP  Respiratory: mild increased work of breathing with exertion (sitting up on side of bed). BS distant but clear in bases. Expiratory wheeze upper lobes left greater than right. No crackles  Abdomen: obese, soft +BS non-tender to palpation  Data Reviewed: Basic Metabolic Panel:  Lab 02/11/12 5784  NA 132*  K 3.4*  CL 95*  CO2 27  GLUCOSE 129*  BUN 11  CREATININE 0.53  CALCIUM 9.8  MG --  PHOS --   Liver Function Tests:  Lab 02/11/12 2333  AST 15  ALT 11  ALKPHOS 79  BILITOT 0.2*  PROT 6.8  ALBUMIN 3.4*   No results found for this basename: LIPASE:5,AMYLASE:5 in the last 168 hours No results found for this basename: AMMONIA:5 in the last 168 hours CBC:  Lab 02/11/12 2333  WBC 11.8*  NEUTROABS 9.9*  HGB 14.4  HCT 41.7  MCV 86.5  PLT 207   Cardiac Enzymes: No results found for this basename: CKTOTAL:5,CKMB:5,CKMBINDEX:5,TROPONINI:5 in the last 168 hours BNP (last 3 results) No results found for this basename: PROBNP:3 in the last 8760 hours CBG: No results found for this basename: GLUCAP:5 in the last 168 hours  No results found for this or any previous visit (from the past 240 hour(s)).   Studies: Dg Chest 2 View  02/11/2012  *RADIOLOGY REPORT*  Clinical Data: Intermittent productive cough since November, 2013. Shortness of breath.  The patient began treatment with Prednisone and Doxycycline yesterday.  CHEST - 2 VIEW  Comparison: Two-view chest x-ray 01/10/2012, 11/29/2009.  Findings: Cardiac silhouette normal in size, unchanged.  Thoracic aorta mildly atherosclerotic, unchanged.  Hilar and mediastinal contours otherwise unremarkable.  Prominent bronchovascular markings diffusely and moderate central peribronchial thickening, more so than on the prior examinations.  No confluent airspace consolidation.  No pleural effusions.  Prior thoracolumbar fusion and dorsal column stimulating device noted overlying the mid  thoracic spine.  IMPRESSION: Moderate changes of acute bronchitis and/or asthma without localized airspace pneumonia.   Original Report Authenticated By: Hulan Saas, M.D.     Scheduled Meds:   . amLODipine  10 mg Oral Daily  . antiseptic oral rinse  15 mL Mouth Rinse BID  . aspirin EC  81 mg Oral Daily  . budesonide-formoterol  2 puff Inhalation BID  . DULoxetine  60 mg Oral Daily  . enoxaparin (LOVENOX) injection  40 mg Subcutaneous Q24H  . gabapentin  300 mg Oral QHS  . ipratropium  0.5 mg Nebulization Q6H WA  . levofloxacin (LEVAQUIN) IV  500 mg Intravenous Q24H  . lisinopril  20 mg Oral Daily  . methocarbamol  500 mg Oral TID  . methylPREDNISolone (SOLU-MEDROL) injection  60 mg Intravenous Daily  . simvastatin  20 mg Oral q1800  . tobramycin  2 drop Both Eyes TID   Continuous Infusions:   . sodium chloride 75 mL/hr at 02/12/12 0349  . albuterol Stopped (02/11/12 1615)    Principal Problem:  *Acute respiratory failure with hypoxia Active Problems:  COPD exacerbation  Hypertension  Hypoxia  Tachycardia    Time spent: 30 minutes    [facility] M  [facility]  If 8PM-8AM, please contact night-coverage at [email or web address], password [facility] 02/12/2012, 7:52 AM  LOS: 1 day

## 2012-02-12 NOTE — Progress Notes (Addendum)
NT [name] checked sats and ambulated pt in hallway on RA.                                                                                    sats at rest RA prior to walking 92% While ambulating in hall on RA, sats ran 89-90%, after return to room and sitting, sats dropped to 84%, but back up to 88-89% on RA.  Pt denied SOB while walking. NT reports pt had no problem walking, no SOB observed.

## 2012-02-12 NOTE — Progress Notes (Signed)
Patient seen and examined. Agree with note by Toya Smothers, NP. Patient admitted with a COPD exacerbation. Although SOB has improved, she continues to require oxygen and is still wheezing (albeit improved). Will keep her in the hospital one more day to optimize her respiratory status. Will initiate steroid titration. Switch antibiotics to PO in am.  Peggye Pitt, MD [facility] Pager: [phone]

## 2012-02-12 NOTE — H&P (Signed)
Have seen and examined the patient agree with above note and plan.  1. COPD exacerbation - will start steroid taper, nebulizer treatments, Levaquin. Patient already improving and her O2 requirement is now down to 2 L form 5 L earlier.  2. Hypoxia - likely due to COPD exacerbation - improving continue O2 as needed. 3. Tachycardia initially likely due to albuterol administration and respiratory distress currently down to 90's will order TSH level.   Trevon Strothers 1:54 AM

## 2012-02-12 NOTE — Progress Notes (Signed)
Dr Ardyth Harps notified of sats noted in progress notes with ambulation on RA and at rest.  Will replace O2 at 2L and continue to monitor. MD does not plan to d/c pt today.

## 2012-02-13 MED ORDER — KETOROLAC TROMETHAMINE 30 MG/ML IJ SOLN
30.0000 mg | Freq: Once | INTRAMUSCULAR | Status: AC
  Administered 2012-02-13: 30 mg via INTRAVENOUS
  Filled 2012-02-13: qty 1

## 2012-02-13 MED ORDER — OXYCODONE HCL 5 MG PO TABS
5.0000 mg | ORAL_TABLET | Freq: Once | ORAL | Status: DC
  Filled 2012-02-13: qty 1

## 2012-02-13 MED ORDER — PREDNISONE (PAK) 10 MG PO TABS: 10.0000 mg | ORAL_TABLET | Freq: Every day | ORAL | Status: DC

## 2012-02-13 MED ORDER — LEVOFLOXACIN 750 MG PO TABS: 750.0000 mg | ORAL_TABLET | Freq: Every day | ORAL | Status: DC

## 2012-02-13 NOTE — Care Management Note (Signed)
    Page 1 of 1   02/13/2012     11:01:06 AM   CARE MANAGEMENT NOTE 02/13/2012  Patient:  [name], [name]   Account Number:  [id number]  Date Initiated:  02/13/2012  Documentation initiated by:  GRAVES-BIGELOW,Skylor Schnapp  Subjective/Objective Assessment:   Pt admittted with COPD exacerbation and plan for d/c today with oxygen.     Action/Plan:   Pt is agreeable to [facility] for dme services and will be delivered to room before d/c. Per pt will not need any skilled services.   Anticipated DC Date:  02/13/2012   Anticipated DC Plan:  HOME/SELF CARE      DC Planning Services  CM consult      PAC Choice  DURABLE MEDICAL EQUIPMENT   Choice offered to / List presented to:  C-1 Patient   DME arranged  [facility]      DME agency  [facility]        Status of service:  Completed, signed off Medicare Important Message given?   (If response is "NO", the following Medicare IM given date fields will be blank) Date Medicare IM given:   Date Additional Medicare IM given:    Discharge Disposition:  HOME/SELF CARE  Per UR Regulation:  Reviewed for med. necessity/level of care/duration of stay  If discussed at Long Length of Stay Meetings, dates discussed:    Comments:

## 2012-02-13 NOTE — Progress Notes (Signed)
PT D/C home with husband. Copy of instructions provided. Scripts were electronically sent by MD.  Pt d/c home with belongings, with home O2 at 2L per portable tank, per wheelchair escorted by unit NT.

## 2012-02-13 NOTE — Discharge Summary (Signed)
Physician Discharge Summary  Patient ID: JOYE WESENBERG MRN: [id number] DOB/AGE: 07/17/1950 62 y.o.  Admit date: 02/11/2012 Discharge date: 02/13/2012  Primary Care Physician:  Tally Due, MD Further admissions to the hospital need to be referred to the [facility].  Discharge Diagnoses:    Principal Problem:  *Acute respiratory failure with hypoxia Active Problems:  COPD exacerbation  Hypertension  Hypoxia  Tachycardia      Medication List     As of 02/13/2012 10:10 AM    STOP taking these medications         celecoxib 200 MG capsule   Commonly known as: CELEBREX      doxycycline 100 MG capsule   Commonly known as: VIBRAMYCIN      methylPREDNISolone acetate 80 MG/ML injection   Commonly known as: DEPO-MEDROL      predniSONE 20 MG tablet   Commonly known as: DELTASONE      TAKE these medications         albuterol 108 (90 BASE) MCG/ACT inhaler   Commonly known as: PROVENTIL HFA;VENTOLIN HFA   Inhale 2 puffs into the lungs every 4 (four) hours as needed.      amLODipine 10 MG tablet   Commonly known as: NORVASC   Take 10 mg by mouth daily.      aspirin 81 MG tablet   Take 81 mg by mouth daily.      budesonide-formoterol 160-4.5 MCG/ACT inhaler   Commonly known as: SYMBICORT   Inhale 2 puffs into the lungs 2 (two) times daily.      CALCIUM PO   Take 1 tablet by mouth daily.      DULoxetine 60 MG capsule   Commonly known as: CYMBALTA   Take 60 mg by mouth daily.      fish oil-omega-3 fatty acids 1000 MG capsule   Take 2 g by mouth daily.      gabapentin 300 MG capsule   Commonly known as: NEURONTIN   Take 300 mg by mouth at bedtime.      GARLIQUE 400 MG Tbec   Generic drug: Garlic   Take 400 mg by mouth.      levofloxacin 750 MG tablet   Commonly known as: LEVAQUIN   Take 1 tablet (750 mg total) by mouth daily.      lisinopril 20 MG tablet   Commonly known as: PRINIVIL,ZESTRIL   Take 20 mg by mouth daily.     methocarbamol 500 MG tablet   Commonly known as: ROBAXIN   Take 500 mg by mouth 3 (three) times daily.      multivitamin with minerals Tabs   Take 1 tablet by mouth daily.      Oxycodone HCl 10 MG Tabs   Take 10 mg by mouth every 4 (four) hours as needed. For pain      POTASSIUM PO   Take 1 tablet by mouth daily.      pravastatin 40 MG tablet   Commonly known as: PRAVACHOL   Take 40 mg by mouth daily.      predniSONE 10 MG tablet   Commonly known as: STERAPRED UNI-PAK   Take 1 tablet (10 mg total) by mouth daily. Take 40 mg today and then decrease by 1 tablet daily until off.      SPIRIVA HANDIHALER 18 MCG inhalation capsule   Generic drug: tiotropium   INHALE CONTENTS OF 1 CAPSULE EVERY DAY USING HANDIHALER      tobramycin 0.3 %  ophthalmic solution   Commonly known as: TOBREX   Place 2 drops into both eyes 3 (three) times daily.      zolpidem 10 MG tablet   Commonly known as: AMBIEN   Take 10 mg by mouth at bedtime as needed. For insomnia         Disposition and Follow-up:  Will be discharged home today in stable and improved condition. Will need to follow up with PCP in 2 weeks to determine continued necessity for oxygen use.  Consults:  None   Significant Diagnostic Studies:  Dg Chest 2 View  02/11/2012  *RADIOLOGY REPORT*  Clinical Data: Intermittent productive cough since November, 2013. Shortness of breath.  The patient began treatment with Prednisone and Doxycycline yesterday.  CHEST - 2 VIEW  Comparison: Two-view chest x-ray 01/10/2012, 11/29/2009.  Findings: Cardiac silhouette normal in size, unchanged.  Thoracic aorta mildly atherosclerotic, unchanged.  Hilar and mediastinal contours otherwise unremarkable.  Prominent bronchovascular markings diffusely and moderate central peribronchial thickening, more so than on the prior examinations.  No confluent airspace consolidation.  No pleural effusions.  Prior thoracolumbar fusion and dorsal column stimulating device  noted overlying the mid thoracic spine.  IMPRESSION: Moderate changes of acute bronchitis and/or asthma without localized airspace pneumonia.   Original Report Authenticated By: Hulan Saas, M.D.     Brief H and P: For complete details please refer to admission H and P, but in brief patient is a 62 y/o woman with a history of COPD presented to ED with a shortness of breath, congestion and productive cough with thick yellow sputum. She was seen by her PCP on 02/10/2012 for the same symptoms and was prescribed Doxycycline for a sinus infection, Prednisone for COPD exacerbation, and Tobramycin opthatlmic solution for Conjunctivits. She has been taking the medications as prescribed and took a 60 mg dose of Prednisone just prior to arrival in the ED today. She continues with significant wheezing and dyspnea in spite of neb treatments. She also indicates that she has had intermittent fever at home. We were asked to admit her for further evaluation and management.     Hospital Course:  Principal Problem:  *Acute respiratory failure with hypoxia Active Problems:  COPD exacerbation  Hypertension  Hypoxia  Tachycardia   Acute Hypoxic Resp Failure -2/2 COPD with acute exacerbation. -Improved, but is still requiring oxygen. -Will be discharged on levaquin, prednisone taper and oxygen at 2 L. -Advised to follow up with her PCP in 2 weeks.  Rest of chronic medical conditions have been stable and her home medications have not been changed.   Time spent on Discharge: Greater than 30 minutes.  SignedChaya Jan [facility] Pager: [phone] 02/13/2012, 10:10 AM

## 2012-02-13 NOTE — Progress Notes (Signed)
SATURATION QUALIFICATIONS: (This note is used to comply with regulatory documentation for home oxygen)  Patient Saturations on Room Air at Rest = 88  Patient Saturations on Room Air while Ambulating = 84  Patient Saturations on Liters of oxygen while Ambulating =   Please briefly explain why patient needs home oxygen:       sats drop to 88% RA at rest sats drop to 84% RA ambulating in hallway, pt felt weak while ambulating, denied SOB.  O 2 reapplied at 1L and sats at 93% at rest

## 2012-02-13 NOTE — Plan of Care (Signed)
Problem: Discharge Progression Outcomes Goal: O2 sats > or equal 90% or at baseline Outcome: Not Met (add Reason) Pt d/c with home O2

## 2012-02-15 NOTE — ED Provider Notes (Signed)
Medical screening examination/treatment/procedure(s) were performed by non-physician practitioner and as supervising physician I was immediately available for consultation/collaboration.   Rolan Bucco, MD 02/15/12 [phone]

## 2012-03-29 ENCOUNTER — Other Ambulatory Visit: Payer: Self-pay | Admitting: Radiology

## 2013-06-19 ENCOUNTER — Emergency Department (HOSPITAL_COMMUNITY)
Admission: EM | Admit: 2013-06-19 | Discharge: 2013-06-19 | Disposition: A | Discharge status: Home / Self Care | Payer: PRIVATE HEALTH INSURANCE | Attending: Emergency Medicine | Admitting: Emergency Medicine

## 2013-06-19 ENCOUNTER — Encounter (HOSPITAL_COMMUNITY): Payer: Self-pay | Admitting: Emergency Medicine

## 2013-06-19 ENCOUNTER — Emergency Department (HOSPITAL_COMMUNITY): Payer: PRIVATE HEALTH INSURANCE

## 2013-06-19 DIAGNOSIS — Z79899 Other long term (current) drug therapy: Secondary | ICD-10-CM | POA: Insufficient documentation

## 2013-06-19 DIAGNOSIS — M25551 Pain in right hip: Secondary | ICD-10-CM

## 2013-06-19 DIAGNOSIS — I10 Essential (primary) hypertension: Secondary | ICD-10-CM | POA: Insufficient documentation

## 2013-06-19 DIAGNOSIS — J4489 Other specified chronic obstructive pulmonary disease: Secondary | ICD-10-CM | POA: Insufficient documentation

## 2013-06-19 DIAGNOSIS — Y939 Activity, unspecified: Secondary | ICD-10-CM | POA: Insufficient documentation

## 2013-06-19 DIAGNOSIS — F17200 Nicotine dependence, unspecified, uncomplicated: Secondary | ICD-10-CM | POA: Insufficient documentation

## 2013-06-19 DIAGNOSIS — W010XXA Fall on same level from slipping, tripping and stumbling without subsequent striking against object, initial encounter: Secondary | ICD-10-CM | POA: Insufficient documentation

## 2013-06-19 DIAGNOSIS — Y92009 Unspecified place in unspecified non-institutional (private) residence as the place of occurrence of the external cause: Secondary | ICD-10-CM | POA: Insufficient documentation

## 2013-06-19 DIAGNOSIS — S79929A Unspecified injury of unspecified thigh, initial encounter: Secondary | ICD-10-CM | POA: Insufficient documentation

## 2013-06-19 DIAGNOSIS — J449 Chronic obstructive pulmonary disease, unspecified: Secondary | ICD-10-CM | POA: Insufficient documentation

## 2013-06-19 DIAGNOSIS — S79919A Unspecified injury of unspecified hip, initial encounter: Secondary | ICD-10-CM | POA: Insufficient documentation

## 2013-06-19 DIAGNOSIS — IMO0002 Reserved for concepts with insufficient information to code with codable children: Secondary | ICD-10-CM | POA: Insufficient documentation

## 2013-06-19 DIAGNOSIS — W19XXXA Unspecified fall, initial encounter: Secondary | ICD-10-CM

## 2013-06-19 DIAGNOSIS — Z7982 Long term (current) use of aspirin: Secondary | ICD-10-CM | POA: Insufficient documentation

## 2013-06-19 MED ORDER — OXYCODONE-ACETAMINOPHEN 5-325 MG PO TABS: 1.0000 | ORAL_TABLET | Freq: Four times a day (QID) | ORAL | Status: DC | PRN

## 2013-06-19 MED ORDER — MORPHINE SULFATE 4 MG/ML IJ SOLN
4.0000 mg | Freq: Once | INTRAMUSCULAR | Status: AC
  Administered 2013-06-19: 4 mg via INTRAVENOUS
  Filled 2013-06-19: qty 1

## 2013-06-19 NOTE — ED Provider Notes (Signed)
CSN: [id number]     Arrival date & time 06/19/13  0459 History   First MD Initiated Contact with Patient 06/19/13 [phone]     Chief Complaint  Patient presents with  . Fall     (Consider location/radiation/quality/duration/timing/severity/associated sxs/prior Treatment) HPI Comments: Patient presents with a chief complaint of right hip pain after falling from a standing position this morning.  She reports that she slipped and fell.  She landed on her right hip when she fell.  She denies hitting her head or LOC.  She denies any dizziness or lightheadedness before or after the fall.  Denies chest pain or SOB.  She states that she has not attempted to bear weight since the fall.  Pain worse with palpation and movement of the hip.  She denies numbness or tingling.  Denies any prior hip injury.  The history is provided by the patient.    Past Medical History  Diagnosis Date  . COPD (chronic obstructive pulmonary disease)   . Emphysema   . Bronchitis   . Hypertension   . Asthma    Past Surgical History  Procedure Laterality Date  . Back surgery    . Abdominal hysterectomy    . Appendectomy     No family history on file. History  Substance Use Topics  . Smoking status: Current Every Day Smoker -- 1.50 packs/day    Types: Cigarettes  . Smokeless tobacco: Not on file  . Alcohol Use: No   OB History   Grav Para Term Preterm Abortions TAB SAB Ect Mult Living                 Review of Systems  Musculoskeletal:       Right hip pain  Neurological: Negative for numbness.  All other systems reviewed and are negative.     Allergies  Review of patient's allergies indicates no known allergies.  Home Medications   Prior to Admission medications   Medication Sig Start Date End Date Taking? Authorizing Provider  amLODipine (NORVASC) 10 MG tablet Take 10 mg by mouth daily.   Yes Historical Provider, MD  aspirin 81 MG tablet Take 81 mg by mouth daily.   Yes Historical Provider, MD   budesonide-formoterol (SYMBICORT) 160-4.5 MCG/ACT inhaler Inhale 2 puffs into the lungs 2 (two) times daily.   Yes Historical Provider, MD  CALCIUM PO Take 1 tablet by mouth daily.   Yes Historical Provider, MD  gabapentin (NEURONTIN) 300 MG capsule Take 300 mg by mouth at bedtime.   Yes Historical Provider, MD  lisinopril (PRINIVIL,ZESTRIL) 20 MG tablet Take 20 mg by mouth daily.   Yes Historical Provider, MD  methocarbamol (ROBAXIN) 500 MG tablet Take 500 mg by mouth 3 (three) times daily.   Yes Historical Provider, MD  Multiple Vitamin (MULTIVITAMIN WITH MINERALS) TABS Take 1 tablet by mouth daily.   Yes Historical Provider, MD  Oxycodone HCl 10 MG TABS Take 10 mg by mouth every 4 (four) hours as needed. For pain   Yes Historical Provider, MD  POTASSIUM PO Take 1 tablet by mouth daily.   Yes Historical Provider, MD  pravastatin (PRAVACHOL) 40 MG tablet Take 40 mg by mouth daily.   Yes Historical Provider, MD  tiotropium (SPIRIVA) 18 MCG inhalation capsule Place 18 mcg into inhaler and inhale daily.   Yes Historical Provider, MD  albuterol (PROVENTIL HFA;VENTOLIN HFA) 108 (90 BASE) MCG/ACT inhaler Inhale 2 puffs into the lungs every 4 (four) hours as needed. 10/28/11   Viviann SpareSteven  A Daub, MD   BP 149/76  Pulse 84  Temp(Src) 98.2 F (36.8 C) (Oral)  Resp 14  SpO2 94% Physical Exam  Nursing note and vitals reviewed. Constitutional: She appears well-developed and well-nourished.  HENT:  Head: Normocephalic and atraumatic.  Mouth/Throat: Oropharynx is clear and moist.  Neck: Normal range of motion. Neck supple.  Cardiovascular: Normal rate, regular rhythm and normal heart sounds.   Pulses:      Dorsalis pedis pulses are 2+ on the right side, and 2+ on the left side.  Pulmonary/Chest: Effort normal and breath sounds normal.  Musculoskeletal:       Right hip: She exhibits tenderness and bony tenderness. She exhibits no swelling and no deformity.  Pain with ROM of the right hip  Neurological:  She is alert.  Sensation of the right foot intact  Skin: Skin is warm, dry and intact.  Psychiatric: She has a normal mood and affect.    ED Course  Procedures (including critical care time) Labs Review Labs Reviewed - No data to display  Imaging Review Dg Hip Complete Right  06/19/2013   CLINICAL DATA:  Right posterior hip pain after a fall.  EXAM: RIGHT HIP - COMPLETE 2+ VIEW  COMPARISON:  None.  FINDINGS: Postoperative changes with posterior rod and screw fixation of the lower lumbar spine and right sacroiliac joint. Right hip appears intact. No evidence of acute fracture or dislocation. No focal bone lesions.  IMPRESSION: No acute bony abnormalities   Electronically Signed   By: Burman NievesWilliam  Stevens M.D.   On: 06/19/2013 06:04     EKG Interpretation None      MDM   Final diagnoses:  None   Patient presenting with right hip pain after a mechanical fall.  No LOC.  Patient denies hitting her head.  Xray of the right hip is negative.  Patient given pain medication and pain improved.  Patient able to bear some weight.  Patient neurovascularly intact.  She reports that she has both crutches and a wheelchair at home.  Patient stable for discharge.  Return precautions given.    Santiago GladHeather Kabeer Hoagland, PA-C 06/19/13 [phone]

## 2013-06-19 NOTE — ED Notes (Signed)
Patient transported to X-ray 

## 2013-06-19 NOTE — ED Notes (Signed)
Pt. lost her balance and fell at home this morning , no LOC / alert and oriented reports pain at right hip .

## 2013-06-20 NOTE — ED Provider Notes (Signed)
Medical screening examination/treatment/procedure(s) were performed by non-physician practitioner and as supervising physician I was immediately available for consultation/collaboration.   EKG Interpretation None        Adhira Jamil, MD 06/20/13 0641 

## 2013-07-04 ENCOUNTER — Other Ambulatory Visit: Payer: Self-pay | Admitting: Orthopedic Surgery

## 2013-07-04 ENCOUNTER — Ambulatory Visit
Admission: RE | Admit: 2013-07-04 | Discharge: 2013-07-04 | Disposition: A | Discharge status: Home / Self Care | Payer: Medicare Other | Source: Ambulatory Visit | Attending: Orthopedic Surgery | Admitting: Orthopedic Surgery

## 2013-07-04 DIAGNOSIS — M25559 Pain in unspecified hip: Secondary | ICD-10-CM

## 2013-07-04 DIAGNOSIS — R102 Pelvic and perineal pain: Secondary | ICD-10-CM

## 2014-03-27 ENCOUNTER — Encounter (HOSPITAL_BASED_OUTPATIENT_CLINIC_OR_DEPARTMENT_OTHER): Payer: Self-pay | Admitting: *Deleted

## 2014-03-27 ENCOUNTER — Emergency Department (HOSPITAL_BASED_OUTPATIENT_CLINIC_OR_DEPARTMENT_OTHER): Payer: Medicare Other

## 2014-03-27 ENCOUNTER — Emergency Department (HOSPITAL_BASED_OUTPATIENT_CLINIC_OR_DEPARTMENT_OTHER)
Admission: EM | Admit: 2014-03-27 | Discharge: 2014-03-27 | Disposition: A | Discharge status: Home / Self Care | Payer: Medicare Other | Attending: Emergency Medicine | Admitting: Emergency Medicine

## 2014-03-27 DIAGNOSIS — Z720 Tobacco use: Secondary | ICD-10-CM | POA: Insufficient documentation

## 2014-03-27 DIAGNOSIS — I10 Essential (primary) hypertension: Secondary | ICD-10-CM | POA: Insufficient documentation

## 2014-03-27 DIAGNOSIS — E785 Hyperlipidemia, unspecified: Secondary | ICD-10-CM | POA: Insufficient documentation

## 2014-03-27 DIAGNOSIS — Z7951 Long term (current) use of inhaled steroids: Secondary | ICD-10-CM | POA: Diagnosis not present

## 2014-03-27 DIAGNOSIS — Z7982 Long term (current) use of aspirin: Secondary | ICD-10-CM | POA: Diagnosis not present

## 2014-03-27 DIAGNOSIS — J441 Chronic obstructive pulmonary disease with (acute) exacerbation: Secondary | ICD-10-CM | POA: Diagnosis not present

## 2014-03-27 DIAGNOSIS — R0602 Shortness of breath: Secondary | ICD-10-CM | POA: Diagnosis present

## 2014-03-27 DIAGNOSIS — Z79899 Other long term (current) drug therapy: Secondary | ICD-10-CM | POA: Diagnosis not present

## 2014-03-27 DIAGNOSIS — Z7952 Long term (current) use of systemic steroids: Secondary | ICD-10-CM | POA: Insufficient documentation

## 2014-03-27 DIAGNOSIS — Z792 Long term (current) use of antibiotics: Secondary | ICD-10-CM | POA: Insufficient documentation

## 2014-03-27 HISTORY — DX: Hyperlipidemia, unspecified: E78.5

## 2014-03-27 LAB — CBC WITH DIFFERENTIAL/PLATELET
BASOS ABS: 0 10*3/uL (ref 0.0–0.1)
Basophils Relative: 0 % (ref 0–1)
Eosinophils Absolute: 0 10*3/uL (ref 0.0–0.7)
Eosinophils Relative: 0 % (ref 0–5)
HCT: 42.7 % (ref 36.0–46.0)
Hemoglobin: 13.9 g/dL (ref 12.0–15.0)
Lymphocytes Relative: 9 % — ABNORMAL LOW (ref 12–46)
Lymphs Abs: 1.2 10*3/uL (ref 0.7–4.0)
MCH: 29.8 pg (ref 26.0–34.0)
MCHC: 32.6 g/dL (ref 30.0–36.0)
MCV: 91.4 fL (ref 78.0–100.0)
MONO ABS: 2.1 10*3/uL — AB (ref 0.1–1.0)
Monocytes Relative: 16 % — ABNORMAL HIGH (ref 3–12)
Neutro Abs: 9.8 10*3/uL — ABNORMAL HIGH (ref 1.7–7.7)
Neutrophils Relative %: 75 % (ref 43–77)
PLATELETS: 311 10*3/uL (ref 150–400)
RBC: 4.67 MIL/uL (ref 3.87–5.11)
RDW: 14.1 % (ref 11.5–15.5)
WBC: 13.1 10*3/uL — AB (ref 4.0–10.5)

## 2014-03-27 LAB — COMPREHENSIVE METABOLIC PANEL
ALT: 29 U/L (ref 0–35)
AST: 35 U/L (ref 0–37)
Albumin: 3.5 g/dL (ref 3.5–5.2)
Alkaline Phosphatase: 84 U/L (ref 39–117)
Anion gap: 6 (ref 5–15)
BUN: 12 mg/dL (ref 6–23)
CALCIUM: 8.9 mg/dL (ref 8.4–10.5)
CO2: 38 mmol/L — ABNORMAL HIGH (ref 19–32)
Chloride: 89 mmol/L — ABNORMAL LOW (ref 96–112)
Creatinine, Ser: 0.53 mg/dL (ref 0.50–1.10)
GFR calc Af Amer: >90 mL/min (ref >90)
GFR calc non Af Amer: >90 mL/min (ref >90)
Glucose, Bld: 105 mg/dL — ABNORMAL HIGH (ref 70–99)
Potassium: 3.6 mmol/L (ref 3.5–5.1)
SODIUM: 133 mmol/L — AB (ref 135–145)
TOTAL PROTEIN: 6.7 g/dL (ref 6.0–8.3)
Total Bilirubin: 0.1 mg/dL — ABNORMAL LOW (ref 0.3–1.2)

## 2014-03-27 LAB — TROPONIN I

## 2014-03-27 LAB — BRAIN NATRIURETIC PEPTIDE: B NATRIURETIC PEPTIDE 5: 103.8 pg/mL — AB (ref 0.0–100.0)

## 2014-03-27 MED ORDER — METHYLPREDNISOLONE SODIUM SUCC 125 MG IJ SOLR
125.0000 mg | Freq: Once | INTRAMUSCULAR | Status: AC
  Administered 2014-03-27: 125 mg via INTRAVENOUS
  Filled 2014-03-27: qty 2

## 2014-03-27 MED ORDER — PREDNISONE 10 MG PO TABS: 20.0000 mg | ORAL_TABLET | Freq: Two times a day (BID) | ORAL | Status: DC

## 2014-03-27 MED ORDER — ALBUTEROL SULFATE (2.5 MG/3ML) 0.083% IN NEBU
2.5000 mg | INHALATION_SOLUTION | Freq: Once | RESPIRATORY_TRACT | Status: AC
  Administered 2014-03-27: 2.5 mg via RESPIRATORY_TRACT
  Filled 2014-03-27: qty 3

## 2014-03-27 MED ORDER — IPRATROPIUM-ALBUTEROL 0.5-2.5 (3) MG/3ML IN SOLN
3.0000 mL | Freq: Once | RESPIRATORY_TRACT | Status: AC
  Administered 2014-03-27: 3 mL via RESPIRATORY_TRACT
  Filled 2014-03-27: qty 3

## 2014-03-27 MED ORDER — AZITHROMYCIN 250 MG PO TABS: ORAL_TABLET | ORAL | Status: DC

## 2014-03-27 NOTE — ED Provider Notes (Signed)
CSN: [id number]     Arrival date & time 03/27/14  1191 History  This chart was scribed for Geoffery Lyons, MD by Annye Asa, ED Scribe. This patient was seen in room MH12/MH12 and the patient's care was started at 7:07 PM.    Chief Complaint  Patient presents with  . Shortness of Breath   Patient is a 64 y.o. female presenting with shortness of breath. The history is provided by the patient. No language interpreter was used.  Shortness of Breath    HPI Comments: [name] is a 64 y.o. female with past medical history of COPD, bronchitis, asthma, HTN, HLD who presents to the Emergency Department complaining of several months of gradually worsening intermittent SOB with recent "cough and cold" symptoms. She reports chest congestion with slightly productive cough. She denies fever.   She explains she has been to her PCP (Dr. Electa Sniff) and several Urgent Cares for this issue; she has been placed on a "long list" of medications, including steroids, without long-term improvement or resolution of the issue.   She is on 2L home O2 and has a home neb. She is a 1.5ppd smoker. She reports familial cardiac history but denies any personal cardiac history.   Past Medical History  Diagnosis Date  . COPD (chronic obstructive pulmonary disease)   . Emphysema   . Bronchitis   . Hypertension   . Asthma   . Hyperlipidemia    Past Surgical History  Procedure Laterality Date  . Back surgery    . Abdominal hysterectomy    . Appendectomy     No family history on file. History  Substance Use Topics  . Smoking status: Current Every Day Smoker -- 1.50 packs/day    Types: Cigarettes  . Smokeless tobacco: Not on file  . Alcohol Use: No   OB History    No data available     Review of Systems  A complete 10 system review of systems was obtained and all systems are negative except as noted in the HPI and PMH.   Allergies  Review of patient's allergies indicates no known allergies.  Home  Medications   Prior to Admission medications   Medication Sig Start Date End Date Taking? Authorizing Provider  benzonatate (TESSALON) 100 MG capsule Take by mouth 3 (three) times daily as needed for cough.   Yes Historical Provider, MD  cyclobenzaprine (FLEXERIL) 10 MG tablet Take 10 mg by mouth 3 (three) times daily as needed for muscle spasms.   Yes Historical Provider, MD  levofloxacin (LEVAQUIN) 500 MG tablet Take 500 mg by mouth daily.   Yes Historical Provider, MD  predniSONE (DELTASONE) 20 MG tablet Take 20 mg by mouth daily with breakfast.   Yes Historical Provider, MD  rOPINIRole (REQUIP) 0.25 MG tablet Take 0.25 mg by mouth 3 (three) times daily.   Yes Historical Provider, MD  albuterol (PROVENTIL HFA;VENTOLIN HFA) 108 (90 BASE) MCG/ACT inhaler Inhale 2 puffs into the lungs every 4 (four) hours as needed. 10/28/11   Collene Gobble, MD  amLODipine (NORVASC) 10 MG tablet Take 10 mg by mouth daily.    Historical Provider, MD  aspirin 81 MG tablet Take 81 mg by mouth daily.    Historical Provider, MD  budesonide-formoterol (SYMBICORT) 160-4.5 MCG/ACT inhaler Inhale 2 puffs into the lungs 2 (two) times daily.    Historical Provider, MD  CALCIUM PO Take 1 tablet by mouth daily.    Historical Provider, MD  gabapentin (NEURONTIN) 300 MG capsule  Take 300 mg by mouth at bedtime.    Historical Provider, MD  lisinopril (PRINIVIL,ZESTRIL) 20 MG tablet Take 20 mg by mouth daily.    Historical Provider, MD  methocarbamol (ROBAXIN) 500 MG tablet Take 500 mg by mouth 3 (three) times daily.    Historical Provider, MD  Multiple Vitamin (MULTIVITAMIN WITH MINERALS) TABS Take 1 tablet by mouth daily.    Historical Provider, MD  Oxycodone HCl 10 MG TABS Take 10 mg by mouth every 4 (four) hours as needed. For pain    Historical Provider, MD  oxyCODONE-acetaminophen (PERCOCET/ROXICET) 5-325 MG per tablet Take 1-2 tablets by mouth every 6 (six) hours as needed for severe pain. 06/19/13   Heather Laisure, PA-C   POTASSIUM PO Take 1 tablet by mouth daily.    Historical Provider, MD  pravastatin (PRAVACHOL) 40 MG tablet Take 40 mg by mouth daily.    Historical Provider, MD  tiotropium (SPIRIVA) 18 MCG inhalation capsule Place 18 mcg into inhaler and inhale daily.    Historical Provider, MD   BP 159/87 mmHg  Pulse 105  Temp(Src) 98.5 F (36.9 C) (Oral)  Resp 28  Ht 5\' 6"  (1.676 m)  Wt 172 lb (78.019 kg)  BMI 27.77 kg/m2  SpO2 96% Physical Exam  Constitutional: She is oriented to person, place, and time. She appears well-developed and well-nourished. No distress.  HENT:  Head: Normocephalic and atraumatic.  Mouth/Throat: Oropharynx is clear and moist. No oropharyngeal exudate.  Eyes: EOM are normal. Pupils are equal, round, and reactive to light.  Neck: Normal range of motion. Neck supple.  Cardiovascular: Normal rate, regular rhythm and normal heart sounds.  Exam reveals no gallop and no friction rub.   No murmur heard. Pulmonary/Chest: Effort normal.  There are bilateral expiratory rhonchi present.   Abdominal: Soft. There is no tenderness. There is no rebound and no guarding.  Musculoskeletal: Normal range of motion. She exhibits edema.  1+ pitting edema of the BLEs  Neurological: She is alert and oriented to person, place, and time.  Skin: Skin is warm and dry. No rash noted.  Psychiatric: She has a normal mood and affect. Her behavior is normal.  Nursing note and vitals reviewed.   ED Course  Procedures   DIAGNOSTIC STUDIES: Oxygen Saturation is 93% on No Name 4L/min, low by my interpretation.    COORDINATION OF CARE: 7:11 PM Discussed treatment plan with pt at bedside and pt agreed to plan.   Labs Review Labs Reviewed - No data to display  Imaging Review No results found.   Date: 03/27/2014  Rate: 100  Rhythm: sinus tachycardia  QRS Axis: normal  Intervals: normal  ST/T Wave abnormalities: normal  Conduction Disutrbances:right bundle branch block  Narrative  Interpretation:   Old EKG Reviewed: none available    MDM   Final diagnoses:  None   Patient is a 64 year old female with history of COPD. She presents with complaints of difficulty breathing and wheezing. She is on home oxygen and is requiring a higher level to maintain her saturations. Today's workup reveals no evidence for pneumonia or congestive heart failure. She is feeling significantly improved with breathing treatments and steroids in the ER. I've discussed the final disposition with her and she is preferring to be discharged. She appears in no distress and I believe this is appropriate. She will be treated with prednisone, antibiotics, home nebs, and when necessary return. She also does not have a pulmonologist and is requesting one with whom she can follow-up.  I personally performed the services described in this documentation, which was scribed in my presence. The recorded information has been reviewed and is accurate.      Geoffery Lyons, MD 03/27/14 2026

## 2014-03-27 NOTE — ED Notes (Signed)
Pt has been having cold symptoms and is being treated by her PCP for same. She sts that she has been getting worse rather than better and apprx. 90 minutes PTA, she became very SOB.

## 2014-03-27 NOTE — Discharge Instructions (Signed)
Zithromax and prednisone as prescribed.  Continue your albuterol treatments every 4 hours as needed.  Follow-up with a pulmonologist. The contact information for Dr. Delford FieldWright has been provided in this discharge summary. Call to arrange this appointment.   Chronic Obstructive Pulmonary Disease Exacerbation Chronic obstructive pulmonary disease (COPD) is a common lung condition in which airflow from the lungs is limited. COPD is a general term that can be used to describe many different lung problems that limit airflow, including chronic bronchitis and emphysema. COPD exacerbations are episodes when breathing symptoms become much worse and require extra treatment. Without treatment, COPD exacerbations can be life threatening, and frequent COPD exacerbations can cause further damage to your lungs. CAUSES   Respiratory infections.   Exposure to smoke.   Exposure to air pollution, chemical fumes, or dust. Sometimes there is no apparent cause or trigger. RISK FACTORS  Smoking cigarettes.  Older age.  Frequent prior COPD exacerbations. SIGNS AND SYMPTOMS   Increased coughing.   Increased thick spit (sputum) production.   Increased wheezing.   Increased shortness of breath.   Rapid breathing.   Chest tightness. DIAGNOSIS  Your medical history, a physical exam, and tests will help your health care provider make a diagnosis. Tests may include:  A chest X-ray.  Basic lab tests.  Sputum testing.  An arterial blood gas test. TREATMENT  Depending on the severity of your COPD exacerbation, you may need to be admitted to a hospital for treatment. Some of the treatments commonly used to treat COPD exacerbations are:   Antibiotic medicines.   Bronchodilators. These are drugs that expand the air passages. They may be given with an inhaler or nebulizer. Spacer devices may be needed to help improve drug delivery.  Corticosteroid medicines.  Supplemental oxygen therapy.   HOME CARE INSTRUCTIONS   Do not smoke. Quitting smoking is very important to prevent COPD from getting worse and exacerbations from happening as often.  Avoid exposure to all substances that irritate the airway, especially to tobacco smoke.   If you were prescribed an antibiotic medicine, finish it all even if you start to feel better.  Take all medicines as directed by your health care provider.It is important to use correct technique with inhaled medicines.  Drink enough fluids to keep your urine clear or pale yellow (unless you have a medical condition that requires fluid restriction).  Use a cool mist vaporizer. This makes it easier to clear your chest when you cough.   If you have a home nebulizer and oxygen, continue to use them as directed.   Maintain all necessary vaccinations to prevent infections.   Exercise regularly.   Eat a healthy diet.   Keep all follow-up appointments as directed by your health care provider. SEEK IMMEDIATE MEDICAL CARE IF:  You have worsening shortness of breath.   You have trouble talking.   You have severe chest pain.  You have blood in your sputum.  You have a fever.  You have weakness, vomit repeatedly, or faint.   You feel confused.   You continue to get worse. MAKE SURE YOU:   Understand these instructions.  Will watch your condition.  Will get help right away if you are not doing well or get worse. Document Released: 11/20/2006 Document Revised: 06/09/2013 Document Reviewed: 09/27/2012 [facility] Patient Information 2015 [employer], [place]. This information is not intended to replace advice given to you by your health care provider. Make sure you discuss any questions you have with your health care provider.

## 2014-04-02 ENCOUNTER — Ambulatory Visit (INDEPENDENT_AMBULATORY_CARE_PROVIDER_SITE_OTHER): Payer: Medicare Other | Admitting: Internal Medicine

## 2014-04-02 ENCOUNTER — Encounter: Payer: Self-pay | Admitting: Internal Medicine

## 2014-04-02 VITALS — BP 160/80 | HR 99 | Ht 67.0 in | Wt 171.0 lb

## 2014-04-02 DIAGNOSIS — J9612 Chronic respiratory failure with hypercapnia: Secondary | ICD-10-CM | POA: Insufficient documentation

## 2014-04-02 DIAGNOSIS — J449 Chronic obstructive pulmonary disease, unspecified: Secondary | ICD-10-CM

## 2014-04-02 DIAGNOSIS — J9611 Chronic respiratory failure with hypoxia: Secondary | ICD-10-CM

## 2014-04-02 DIAGNOSIS — F17210 Nicotine dependence, cigarettes, uncomplicated: Secondary | ICD-10-CM

## 2014-04-02 DIAGNOSIS — Z720 Tobacco use: Secondary | ICD-10-CM

## 2014-04-02 DIAGNOSIS — I10 Essential (primary) hypertension: Secondary | ICD-10-CM

## 2014-04-02 MED ORDER — OLMESARTAN MEDOXOMIL 20 MG PO TABS: 20.0000 mg | ORAL_TABLET | Freq: Every day | ORAL | Status: DC

## 2014-04-02 MED ORDER — ALBUTEROL SULFATE HFA 108 (90 BASE) MCG/ACT IN AERS: INHALATION_SPRAY | RESPIRATORY_TRACT | Status: AC

## 2014-04-02 MED ORDER — PREDNISONE 10 MG PO TABS: ORAL_TABLET | ORAL | Status: DC

## 2014-04-02 MED ORDER — ALBUTEROL SULFATE (2.5 MG/3ML) 0.083% IN NEBU: 2.5000 mg | INHALATION_SOLUTION | Freq: Four times a day (QID) | RESPIRATORY_TRACT | Status: AC | PRN

## 2014-04-02 MED ORDER — TRAMADOL HCL 50 MG PO TABS: ORAL_TABLET | ORAL | Status: DC

## 2014-04-02 NOTE — Progress Notes (Signed)
Subjective:    Patient ID: Daisy FolksRebecca E Pham, female    DOB: Dec 24, 1950,    MRN: [id number]  HPI  [age] yowf active smoker ? Asthma as child? But fine in HS onset of variable sob x 2010 on noct 02  And maint spiriva and symbicort  then much worse since Thx 2/015 referred by friend to   pulmonary clinic 04/02/2014    04/02/2014 1st [facility] office visit/ Sherene SiresWert  / on ACEi  Chief Complaint  Patient presents with  . Pulmonary Consult    Self referral. Pt c/o cough and SOB for the past 2 months. Her cough is prod with large amounts of yellow to green sputum.  She states feels SOB all of the time, but worse with any exertion. Her sats were 78% ra at rest--increased to 92%2lpm continuous o2.    Just finished cipro / typically much better in short run anyway p prednisone but not this most last flare. Sob x across the room.  Onset was abrupt at T[place], pattern is variable but overall declining in terms of best days  No obvious other patterns in day to day or daytime variabilty or assoc chronic cough or cp or chest tightness, subjective wheeze overt sinus or hb symptoms. No unusual exp hx or h/o childhood pna/ asthma or knowledge of premature birth.  Sleeping ok without nocturnal  or early am exacerbation  of respiratory  c/o's or need for noct saba. Also denies any obvious fluctuation of symptoms with weather or environmental changes or other aggravating or alleviating factors except as outlined above   Current Medications, Allergies, Complete Past Medical History, Past Surgical History, Family History, and Social History were reviewed in [facility] electronic medical record.           Review of Systems  Constitutional: Negative for fever, chills and unexpected weight change.  HENT: Positive for congestion and ear pain. Negative for dental problem, nosebleeds, postnasal drip, rhinorrhea, sinus pressure, sneezing, sore throat, trouble swallowing and voice change.   Eyes: Negative for  visual disturbance.  Respiratory: Positive for cough and shortness of breath. Negative for choking.   Cardiovascular: Negative for chest pain and leg swelling.  Gastrointestinal: Negative for vomiting, abdominal pain and diarrhea.  Genitourinary: Negative for difficulty urinating.       Indigestion  Musculoskeletal: Negative for arthralgias.  Skin: Negative for rash.  Neurological: Negative for tremors, syncope and headaches.  Hematological: Does not bruise/bleed easily.       Objective:   Physical Exam    amb wf congested cough/ very hoarse   Wt Readings from Last 3 Encounters:  04/02/14 171 lb (77.565 kg)  03/27/14 172 lb (78.019 kg)  02/13/12 185 lb (83.915 kg)    Vital signs reviewed  HEENT: full dentures, turbinates, and orophanx. Nl external ear canals without cough reflex   NECK :  without JVD/Nodes/TM/ nl carotid upstrokes bilaterally   LUNGS: no acc muscle use,  insp and exp rhonchi and lots of transmitted pseudowheezing    CV:  RRR  no s3 or murmur or increase in P2, no edema   ABD:  soft and nontender with nl excursion in the supine position. No bruits or organomegaly, bowel sounds nl  MS:  warm without deformities, calf tenderness, cyanosis or clubbing  SKIN: warm and dry without lesions    NEURO:  alert, approp, no deficits     I personally reviewed images and agree with radiology impression as follows:  CXR:  03/27/14  There are prominent interstitial/ bronchovascular markings bilaterally. Lungs are mildly hyperexpanded. No lung consolidation or edema. No pleural effusion or pneumothorax.        Assessment & Plan:

## 2014-04-02 NOTE — Patient Instructions (Addendum)
For now stay on 02 2lpm=  24/7  Prednisone 10 mg Take 4 for three days 3 for three days 2 for three days 1 for three days and stop   Stop lisinopril and start benicar 20 mg one half daily until return and your breathing should gradually improve   For cough > mucinex dm 1200 mg every 12 hours and supplement with tramadol 50mg  1 every 4 hours  Plan A =  Automatic = symbicort 160 Take 2 puffs first thing in am and then another 2 puffs about 12 hours later.                                       spriva each am only   Plan B = backup Only use your albuterol (proair)as a rescue medication to be used if you can't catch your breath by resting or doing a relaxed purse lip breathing pattern.  - The less you use it, the better it will work when you need it. - Ok to use up to 2 puffs  every 4 hours if you must but call for immediate appointment if use goes up over your usual need - Don't leave home without it !!  (think of it like the spare tire for your car)   Plan C = crisis = albuterol 2.5 every 4 hours if needed if plan B doesn't work (ok to use up your present neb solution  if you want)  See Tammy NP in  2 weeks with all your medications, even over the counter meds, separated in two separate bags, the ones you take no matter what vs the ones you stop once you feel better and take only as needed when you feel you need them.   Tammy  will generate for you a new user friendly medication calendar that will put us all on the same page re: your medication use.     Without this process, it simply isn't possible to assure that we are providing  your outpatient care  with  the attention to detail we feel you deserve.   If we cannot assure that you're getting that kind of care,  then we cannot manage your problem effectively from this clinic.  Once you have seen Tammy and we are sure that we're all on the same page with your medication use she will arrange follow up with me with pfts

## 2014-04-02 NOTE — Assessment & Plan Note (Addendum)
Clinically only mild/ mod but symptoms are severe and   markedly disproportionate to objective findings and not clear this is a lung problem but pt does appear to have difficult airway management issues.   DDX of  difficult airways management all start with A and  include Adherence, Ace Inhibitors, Acid Reflux, Active Sinus Disease, Alpha 1 Antitripsin deficiency, Anxiety masquerading as Airways dz,  ABPA,  allergy(esp in young), Aspiration (esp in elderly), Adverse effects of DPI,  Active smokers, plus two Bs  = Bronchiectasis and Beta blocker use..and one C= CHF  Adherence is always the initial "prime suspect" and is a multilayered concern that requires a "trust but verify" approach in every patient - starting with knowing how to use medications, especially inhalers, correctly, keeping up with refills and understanding the fundamental difference between maintenance and prns vs those medications only taken for a very short course and then stopped and not refilled.  - The proper method of use, as well as anticipated side effects, of a metered-dose inhaler are discussed and demonstrated to the patient. Improved effectiveness after extensive coaching during this visit to a level of approximately  50% so needs work  Active smoking discussed elsewhere  ACEi > only way to know is trial off  ? Allergy/ asthma > Prednisone x 12 days taper  ? Active sinus dz > sinus ct next step if fails to clear   Will check back in 2 weeks on new rx/   Each maintenance medication was reviewed in detail including most importantly the difference between maintenance and as needed and under what circumstances the prns are to be used.  Please see instructions for details which were reviewed in writing and the patient given a copy.

## 2014-04-02 NOTE — Assessment & Plan Note (Signed)
ACE inhibitors are problematic in  pts with airway complaints because  even experienced pulmonologists can't always distinguish ace effects from copd/asthma.  By themselves they don't actually cause a problem, much like oxygen can't by itself start a fire, but they certainly serve as a powerful catalyst or enhancer for any "fire"  or inflammatory process in the upper airway, be it caused by an ET  tube or more commonly reflux (especially in the obese or pts with known GERD or who are on biphoshonates).    In the era of ARB near equivalency until we have a better handle on the reversibility of the airway problem, it just makes sense to avoid ACEI  entirely  - it's the only way to sort this out  Try benicar 20 mg one half daily and f/u in 2 weeks

## 2014-04-02 NOTE — Assessment & Plan Note (Signed)

## 2014-04-02 NOTE — Assessment & Plan Note (Signed)
rec 24 h 02 at 2lpm as of 04/02/2014

## 2014-04-09 ENCOUNTER — Telehealth: Payer: Self-pay | Admitting: Internal Medicine

## 2014-04-09 ENCOUNTER — Ambulatory Visit: Payer: Medicare Other | Admitting: Internal Medicine

## 2014-04-09 ENCOUNTER — Encounter: Payer: Self-pay | Admitting: Internal Medicine

## 2014-04-09 VITALS — BP 138/62 | HR 96 | Temp 97.4°F | Ht 67.0 in | Wt 178.0 lb

## 2014-04-09 DIAGNOSIS — J9611 Chronic respiratory failure with hypoxia: Secondary | ICD-10-CM

## 2014-04-09 MED ORDER — AMOXICILLIN-POT CLAVULANATE 875-125 MG PO TABS: 1.0000 | ORAL_TABLET | Freq: Two times a day (BID) | ORAL | Status: DC

## 2014-04-09 NOTE — Telephone Encounter (Addendum)
Spoke with the pt  She was seen here on 04/03/14 and states her symptoms are worse since then  She states that she is coughing (yellow sputum) until her chest hurts- despite taking tramadol about every 4 hours  She is having more SOB and wheezing also  She states that she is having to use albuterol inhaler every 2 hours  Needs to be seen but no openings with any provider  I printed last ov note for doc of the day to review  Please advise, thanks! No Known Allergies Current Outpatient Prescriptions on File Prior to Visit  Medication Sig Dispense Refill  . albuterol (PROAIR HFA) 108 (90 BASE) MCG/ACT inhaler 2 puffs every 4 hours as needed only  if your can't catch your breath    . albuterol (PROVENTIL) (2.5 MG/3ML) 0.083% nebulizer solution Take 3 mLs (2.5 mg total) by nebulization every 6 (six) hours as needed for wheezing or shortness of breath. 75 mL 12  . amLODipine (NORVASC) 10 MG tablet Take 10 mg by mouth daily.    Marland Kitchen. aspirin 81 MG tablet Take 81 mg by mouth daily.    . benzonatate (TESSALON) 100 MG capsule Take by mouth 3 (three) times daily as needed for cough.    . budesonide-formoterol (SYMBICORT) 160-4.5 MCG/ACT inhaler Inhale 2 puffs into the lungs 2 (two) times daily.    Marland Kitchen. CALCIUM PO Take 1 tablet by mouth daily.    . cyclobenzaprine (FLEXERIL) 10 MG tablet Take 10 mg by mouth 3 (three) times daily as needed for muscle spasms.    . Ibuprofen-Diphenhydramine HCl 200-25 MG CAPS Take 1 tablet by mouth at bedtime as needed.    . Multiple Vitamin (MULTIVITAMIN WITH MINERALS) TABS Take 1 tablet by mouth daily.    Marland Kitchen. olmesartan (BENICAR) 20 MG tablet Take 1 tablet (20 mg total) by mouth daily.    . Oxycodone HCl 10 MG TABS Take 10 mg by mouth every 4 (four) hours as needed. For pain    . POTASSIUM PO Take 1 tablet by mouth daily.    . pravastatin (PRAVACHOL) 40 MG tablet Take 40 mg by mouth daily.    . predniSONE (DELTASONE) 10 MG tablet Take 4 for three days 3 for three days 2 for three  days 1 for three days and stop 30 tablet 0  . rOPINIRole (REQUIP) 0.25 MG tablet Take 0.25 mg by mouth 3 (three) times daily.    Marland Kitchen. tiotropium (SPIRIVA) 18 MCG inhalation capsule Place 18 mcg into inhaler and inhale daily.    . traMADol (ULTRAM) 50 MG tablet 1-2 every 4 hours as needed for cough or pain 40 tablet 0   No current facility-administered medications on file prior to visit.

## 2014-04-09 NOTE — Patient Instructions (Addendum)
Script sent for augmentin  Neb xop 0.63  Depo 80  Mucinex DM otc may help reduce the cough and make it easier to cough clear  Extra fluids

## 2014-04-09 NOTE — Telephone Encounter (Signed)
CDY had opening at 1:30 and pt will come in then  Nothing further needed

## 2014-04-09 NOTE — Progress Notes (Signed)
   Subjective:    Patient ID: Daisy FolksRebecca E Odell, female    DOB: 27-Jun-1950,    MRN: [id number]  HPI  [age] yowf active smoker ? Asthma as child? But fine in HS onset of variable sob x 2010 on noct 02  And maint spiriva and symbicort  then much worse since Thx 2/015 referred by friend to   pulmonary clinic 04/02/2014    04/02/2014 1st [facility] office visit/ Sherene SiresWert  / on ACEi  Chief Complaint  Patient presents with  . Pulmonary Consult    Self referral. Pt c/o cough and SOB for the past 2 months. Her cough is prod with large amounts of yellow to green sputum.  She states feels SOB all of the time, but worse with any exertion. Her sats were 78% ra at rest--increased to 92%2lpm continuous o2.    Just finished cipro / typically much better in short run anyway p prednisone but not this most last flare. Sob x across the room.  Onset was abrupt at [place], pattern is variable but overall declining in terms of best days No obvious other patterns in day to day or daytime variabilty or assoc chronic cough or cp or chest tightness, subjective wheeze overt sinus or hb symptoms. No unusual exp hx or h/o childhood pna/ asthma or knowledge of premature birth. Sleeping ok without nocturnal  or early am exacerbation  of respiratory  c/o's or need for noct saba. Also denies any obvious fluctuation of symptoms with weather or environmental changes or other aggravating or alleviating factors except as outlined above   impression as follows:  CXR:  03/27/14 There are prominent interstitial/ bronchovascular markings bilaterally. Lungs are mildly hyperexpanded. No lung consolidation or edema. No pleural effusion or pneumothorax.   04/09/14- Acute OV- DR Maple HudsonYoung for Dr Sherene SiresWert FOLLOWS FOR: Pt of MW's. Pt c/o increase in SOB, prod cough with yellow, chest congestion, weakness, fatigue, midsternal CP when coughing and at times the pain radiates to mid back. Pt stated she is feeling worse since seeing MW on 2/25. Pt denies  f/c/s.        Assessment & Plan:

## 2014-04-11 ENCOUNTER — Telehealth: Payer: Self-pay | Admitting: Internal Medicine

## 2014-04-11 MED ORDER — FUROSEMIDE 20 MG PO TABS: 20.0000 mg | ORAL_TABLET | Freq: Every day | ORAL | Status: DC

## 2014-04-11 NOTE — Telephone Encounter (Signed)
On call- Seen in office for acute visit 2 days ago. Given depomedrol. Now feet swelling. Plan Limited trial of lasix called to her drug store.

## 2014-04-16 ENCOUNTER — Ambulatory Visit (INDEPENDENT_AMBULATORY_CARE_PROVIDER_SITE_OTHER): Payer: Medicare Other | Admitting: Adult Health

## 2014-04-16 ENCOUNTER — Encounter: Payer: Self-pay | Admitting: Adult Health

## 2014-04-16 VITALS — BP 136/74 | HR 94 | Temp 98.4°F | Ht 67.5 in | Wt 178.2 lb

## 2014-04-16 DIAGNOSIS — J9611 Chronic respiratory failure with hypoxia: Secondary | ICD-10-CM

## 2014-04-16 DIAGNOSIS — I10 Essential (primary) hypertension: Secondary | ICD-10-CM

## 2014-04-16 DIAGNOSIS — J449 Chronic obstructive pulmonary disease, unspecified: Secondary | ICD-10-CM

## 2014-04-16 MED ORDER — OLMESARTAN MEDOXOMIL 20 MG PO TABS: ORAL_TABLET | ORAL | Status: DC

## 2014-04-16 NOTE — Addendum Note (Signed)
Addended by: Boone MasterJONES, JESSICA E on: 04/16/2014 05:13 PM   Modules accepted: Orders

## 2014-04-16 NOTE — Assessment & Plan Note (Signed)
Compensated on present regimen  Avoid ACE inhibitor as aggravates cough and wheezing

## 2014-04-16 NOTE — Progress Notes (Signed)
Subjective:    Patient ID: [name], female    DOB: [birth year],    MRN: [id number]  HPI  76 yowf active smoker ? Asthma as child? But fine in HS onset of variable sob x 2010 on noct 02  And maint spiriva and symbicort  then much worse since Thx 2/015 referred by friend to   pulmonary clinic 04/02/2014    04/02/2014 1st [facility] office visit/ Sherene Sires  / on ACEi  Chief Complaint  Patient presents with  . Pulmonary Consult    Self referral. Pt c/o cough and SOB for the past 2 months. Her cough is prod with large amounts of yellow to green sputum.  She states feels SOB all of the time, but worse with any exertion. Her sats were 78% ra at rest--increased to 92%2lpm continuous o2.    Just finished cipro / typically much better in short run anyway p prednisone but not this most last flare. Sob x across the room.  Onset was abrupt at T[place], pattern is variable but overall declining in terms of best days >>cont on O2 , pred taper ,  lisinopril and start benicar 20 mg one half daily  , Symbicort rx /spiriva    04/16/2014 Follow up : Cough/ACE and Med Calendar Pt returns for a 2 week follow up and med review  Seen last ov for Cough and Dypsnea.  Taken off ACE  Seen back in office for acute office visit on 3/3 -tx for bronchitis -tx w/ augmentin   Cough is much better, decreased congestion  Feels better . Swelling is better.  Does complain of left ear pain.  We reviewed all her medications and appears to be taking correctly.  System was down unable to complete med calendar .  Denies chest pain., orthopnea, increased edema or fever.   Current Medications, Allergies, Complete Past Medical History, Past Surgical History, Family History, and Social History were reviewed in [facility] record.           Review of Systems  Constitutional: Negative for fever, chills and unexpected weight change.  HENT: Positive for congestion and ear pain. Negative for dental  problem, nosebleeds, postnasal drip, rhinorrhea, sinus pressure, sneezing, sore throat, trouble swallowing and voice change.   Eyes: Negative for visual disturbance.  Respiratory: Positive for cough and shortness of breath. Negative for choking.   Cardiovascular: Negative for chest pain and leg swelling.  Gastrointestinal: Negative for vomiting, abdominal pain and diarrhea.  Genitourinary: Negative for difficulty urinating.  Musculoskeletal: Negative for arthralgias.  Skin: Negative for rash.  Neurological: Negative for tremors, syncope and headaches.  Hematological: Does not bruise/bleed easily.       Objective:   Physical Exam    amb wf congested cough   Vital signs reviewed  HEENT: full dentures, turbinates, and orophanx. Nl external ear canals without cough reflex   NECK :  without JVD/Nodes/TM/ nl carotid upstrokes bilaterally   LUNGS: no acc muscle use,  Tr exp wheeze    CV:  RRR  no s3 or murmur or increase in P2, no edema   ABD:  soft and nontender with nl excursion in the supine position. No bruits or organomegaly, bowel sounds nl  MS:  warm without deformities, calf tenderness, cyanosis or clubbing  SKIN: warm and dry without lesions    NEURO:  alert, approp, no deficits     I personally reviewed images and agree with radiology impression as follows:  CXR:  03/27/14  There are prominent interstitial/ bronchovascular markings bilaterally. Lungs are mildly hyperexpanded. No lung consolidation or edema. No pleural effusion or pneumothorax.        Assessment & Plan:

## 2014-04-16 NOTE — Patient Instructions (Signed)
Continue on Symbicort and Spiriva . -rinse after use.  Mucinex DM Twice daily  As needed  Cough/congestion  Great job on not smoking.  Continue  On Benicar 20mg  1/2 daily .  Follow med calendar closely and bring to each visit.  follow up Dr. Sherene SiresWert  In 4 weeks with PFT and As needed   Please contact office for sooner follow up if symptoms do not improve or worsen or seek emergency care

## 2014-04-16 NOTE — Assessment & Plan Note (Signed)
Recent flare now resolved  Needs PFT

## 2014-04-16 NOTE — Assessment & Plan Note (Signed)
Compensated on present regimen.   

## 2014-05-22 ENCOUNTER — Ambulatory Visit: Payer: Medicare Other | Admitting: Internal Medicine

## 2014-05-25 ENCOUNTER — Ambulatory Visit: Payer: Medicare Other | Admitting: Internal Medicine

## 2014-05-31 ENCOUNTER — Emergency Department (HOSPITAL_BASED_OUTPATIENT_CLINIC_OR_DEPARTMENT_OTHER): Payer: Medicare Other

## 2014-05-31 ENCOUNTER — Encounter (HOSPITAL_BASED_OUTPATIENT_CLINIC_OR_DEPARTMENT_OTHER): Payer: Self-pay | Admitting: *Deleted

## 2014-05-31 ENCOUNTER — Other Ambulatory Visit (HOSPITAL_BASED_OUTPATIENT_CLINIC_OR_DEPARTMENT_OTHER): Payer: Self-pay

## 2014-05-31 ENCOUNTER — Inpatient Hospital Stay (HOSPITAL_BASED_OUTPATIENT_CLINIC_OR_DEPARTMENT_OTHER): Payer: Medicare Other

## 2014-05-31 ENCOUNTER — Inpatient Hospital Stay (HOSPITAL_BASED_OUTPATIENT_CLINIC_OR_DEPARTMENT_OTHER)
Admission: EM | Admit: 2014-05-31 | Discharge: 2014-06-02 | DRG: 871 | Disposition: A | Discharge status: Home / Self Care | Payer: Medicare Other | Attending: Internal Medicine | Admitting: Internal Medicine

## 2014-05-31 DIAGNOSIS — Z7982 Long term (current) use of aspirin: Secondary | ICD-10-CM | POA: Diagnosis not present

## 2014-05-31 DIAGNOSIS — J45909 Unspecified asthma, uncomplicated: Secondary | ICD-10-CM | POA: Diagnosis present

## 2014-05-31 DIAGNOSIS — I5032 Chronic diastolic (congestive) heart failure: Secondary | ICD-10-CM | POA: Diagnosis present

## 2014-05-31 DIAGNOSIS — M549 Dorsalgia, unspecified: Secondary | ICD-10-CM | POA: Diagnosis present

## 2014-05-31 DIAGNOSIS — R0602 Shortness of breath: Secondary | ICD-10-CM

## 2014-05-31 DIAGNOSIS — E871 Hypo-osmolality and hyponatremia: Secondary | ICD-10-CM | POA: Diagnosis present

## 2014-05-31 DIAGNOSIS — I509 Heart failure, unspecified: Secondary | ICD-10-CM | POA: Diagnosis not present

## 2014-05-31 DIAGNOSIS — R0902 Hypoxemia: Secondary | ICD-10-CM

## 2014-05-31 DIAGNOSIS — R000 Tachycardia, unspecified: Secondary | ICD-10-CM | POA: Diagnosis present

## 2014-05-31 DIAGNOSIS — R5383 Other fatigue: Secondary | ICD-10-CM | POA: Diagnosis present

## 2014-05-31 DIAGNOSIS — J9621 Acute and chronic respiratory failure with hypoxia: Secondary | ICD-10-CM | POA: Diagnosis present

## 2014-05-31 DIAGNOSIS — J189 Pneumonia, unspecified organism: Secondary | ICD-10-CM | POA: Diagnosis present

## 2014-05-31 DIAGNOSIS — F17210 Nicotine dependence, cigarettes, uncomplicated: Secondary | ICD-10-CM | POA: Diagnosis present

## 2014-05-31 DIAGNOSIS — G8929 Other chronic pain: Secondary | ICD-10-CM | POA: Diagnosis present

## 2014-05-31 DIAGNOSIS — J9612 Chronic respiratory failure with hypercapnia: Secondary | ICD-10-CM | POA: Diagnosis present

## 2014-05-31 DIAGNOSIS — A419 Sepsis, unspecified organism: Secondary | ICD-10-CM | POA: Diagnosis present

## 2014-05-31 DIAGNOSIS — T40601A Poisoning by unspecified narcotics, accidental (unintentional), initial encounter: Secondary | ICD-10-CM | POA: Insufficient documentation

## 2014-05-31 DIAGNOSIS — I10 Essential (primary) hypertension: Secondary | ICD-10-CM | POA: Diagnosis present

## 2014-05-31 DIAGNOSIS — E785 Hyperlipidemia, unspecified: Secondary | ICD-10-CM | POA: Diagnosis present

## 2014-05-31 DIAGNOSIS — J9611 Chronic respiratory failure with hypoxia: Secondary | ICD-10-CM

## 2014-05-31 DIAGNOSIS — R4182 Altered mental status, unspecified: Secondary | ICD-10-CM | POA: Diagnosis present

## 2014-05-31 DIAGNOSIS — J441 Chronic obstructive pulmonary disease with (acute) exacerbation: Secondary | ICD-10-CM | POA: Diagnosis present

## 2014-05-31 DIAGNOSIS — Z9981 Dependence on supplemental oxygen: Secondary | ICD-10-CM | POA: Diagnosis not present

## 2014-05-31 LAB — COMPREHENSIVE METABOLIC PANEL
ALT: 15 U/L (ref 0–35)
ANION GAP: 11 (ref 5–15)
AST: 16 U/L (ref 0–37)
Albumin: 3.5 g/dL (ref 3.5–5.2)
Alkaline Phosphatase: 84 U/L (ref 39–117)
BUN: 9 mg/dL (ref 6–23)
CALCIUM: 8.6 mg/dL (ref 8.4–10.5)
CHLORIDE: 92 mmol/L — AB (ref 96–112)
CO2: 28 mmol/L (ref 19–32)
CREATININE: 0.47 mg/dL — AB (ref 0.50–1.10)
GFR calc Af Amer: >90 mL/min (ref >90)
GFR calc non Af Amer: >90 mL/min (ref >90)
GLUCOSE: 121 mg/dL — AB (ref 70–99)
Potassium: 3.8 mmol/L (ref 3.5–5.1)
Sodium: 131 mmol/L — ABNORMAL LOW (ref 135–145)
TOTAL PROTEIN: 6.7 g/dL (ref 6.0–8.3)
Total Bilirubin: 0.7 mg/dL (ref 0.3–1.2)

## 2014-05-31 LAB — TROPONIN I
TROPONIN I: 0.38 ng/mL — AB (ref <0.031)
TROPONIN I: 0.03 ng/mL (ref <0.031)
TROPONIN I: 0.08 ng/mL — AB (ref <0.031)
Troponin I: 0.05 ng/mL — ABNORMAL HIGH (ref <0.031)

## 2014-05-31 LAB — I-STAT ARTERIAL BLOOD GAS, ED
Bicarbonate: 26.7 mEq/L — ABNORMAL HIGH (ref 20.0–24.0)
O2 Saturation: 90 %
PCO2 ART: 52.5 mmHg — AB (ref 35.0–45.0)
PO2 ART: 66 mmHg — AB (ref 80.0–100.0)
Patient temperature: 99.3
TCO2: 28 mmol/L (ref 0–100)
pH, Arterial: 7.316 — ABNORMAL LOW (ref 7.350–7.450)

## 2014-05-31 LAB — PROTIME-INR
INR: 1.16 (ref 0.00–1.49)
Prothrombin Time: 15 seconds (ref 11.6–15.2)

## 2014-05-31 LAB — CBC WITH DIFFERENTIAL/PLATELET
Basophils Absolute: 0.1 10*3/uL (ref 0.0–0.1)
Basophils Relative: 0 % (ref 0–1)
EOS ABS: 0.1 10*3/uL (ref 0.0–0.7)
Eosinophils Relative: 1 % (ref 0–5)
HCT: 42.1 % (ref 36.0–46.0)
Hemoglobin: 14 g/dL (ref 12.0–15.0)
LYMPHS ABS: 1.3 10*3/uL (ref 0.7–4.0)
Lymphocytes Relative: 5 % — ABNORMAL LOW (ref 12–46)
MCH: 30.1 pg (ref 26.0–34.0)
MCHC: 33.3 g/dL (ref 30.0–36.0)
MCV: 90.5 fL (ref 78.0–100.0)
MONOS PCT: 7 % (ref 3–12)
Monocytes Absolute: 1.6 10*3/uL — ABNORMAL HIGH (ref 0.1–1.0)
Neutro Abs: 20.7 10*3/uL — ABNORMAL HIGH (ref 1.7–7.7)
Neutrophils Relative %: 87 % — ABNORMAL HIGH (ref 43–77)
Platelets: 308 10*3/uL (ref 150–400)
RBC: 4.65 MIL/uL (ref 3.87–5.11)
RDW: 15 % (ref 11.5–15.5)
WBC: 23.7 10*3/uL — ABNORMAL HIGH (ref 4.0–10.5)

## 2014-05-31 LAB — BRAIN NATRIURETIC PEPTIDE: B Natriuretic Peptide: 462.1 pg/mL — ABNORMAL HIGH (ref 0.0–100.0)

## 2014-05-31 LAB — URINALYSIS, ROUTINE W REFLEX MICROSCOPIC
BILIRUBIN URINE: NEGATIVE
Glucose, UA: NEGATIVE mg/dL
KETONES UR: 40 mg/dL — AB
Leukocytes, UA: NEGATIVE
NITRITE: NEGATIVE
Protein, ur: 100 mg/dL — AB
Specific Gravity, Urine: 1.008 (ref 1.005–1.030)
Urobilinogen, UA: 0.2 mg/dL (ref 0.0–1.0)
pH: 7 (ref 5.0–8.0)

## 2014-05-31 LAB — TSH: TSH: 1.693 u[IU]/mL (ref 0.350–4.500)

## 2014-05-31 LAB — RAPID URINE DRUG SCREEN, HOSP PERFORMED
Amphetamines: NOT DETECTED
BARBITURATES: NOT DETECTED
BENZODIAZEPINES: NOT DETECTED
Cocaine: NOT DETECTED
OPIATES: POSITIVE — AB
TETRAHYDROCANNABINOL: NOT DETECTED

## 2014-05-31 LAB — MRSA PCR SCREENING: MRSA by PCR: NEGATIVE

## 2014-05-31 LAB — URINE MICROSCOPIC-ADD ON

## 2014-05-31 LAB — I-STAT CG4 LACTIC ACID, ED: Lactic Acid, Venous: 0.65 mmol/L (ref 0.5–2.0)

## 2014-05-31 LAB — ETHANOL: Alcohol, Ethyl (B): <5 mg/dL (ref 0–9)

## 2014-05-31 LAB — CBG MONITORING, ED: GLUCOSE-CAPILLARY: 120 mg/dL — AB (ref 70–99)

## 2014-05-31 MED ORDER — METHYLPREDNISOLONE SODIUM SUCC 125 MG IJ SOLR
125.0000 mg | Freq: Once | INTRAMUSCULAR | Status: AC
  Administered 2014-05-31: 125 mg via INTRAVENOUS
  Filled 2014-05-31: qty 2

## 2014-05-31 MED ORDER — GUAIFENESIN ER 600 MG PO TB12
1200.0000 mg | ORAL_TABLET | Freq: Two times a day (BID) | ORAL | Status: DC
  Administered 2014-05-31 – 2014-06-02 (×5): 1200 mg via ORAL
  Filled 2014-05-31 (×7): qty 2

## 2014-05-31 MED ORDER — CEFTRIAXONE SODIUM IN DEXTROSE 20 MG/ML IV SOLN
1.0000 g | INTRAVENOUS | Status: DC
Start: 2014-05-31 — End: 2014-06-01
  Administered 2014-05-31 – 2014-06-01 (×2): 1 g via INTRAVENOUS
  Filled 2014-05-31 (×3): qty 50

## 2014-05-31 MED ORDER — LEVALBUTEROL HCL 0.63 MG/3ML IN NEBU
0.6300 mg | INHALATION_SOLUTION | Freq: Once | RESPIRATORY_TRACT | Status: AC
  Administered 2014-05-31: 0.63 mg via RESPIRATORY_TRACT
  Filled 2014-05-31: qty 3

## 2014-05-31 MED ORDER — ACETAMINOPHEN 325 MG PO TABS
650.0000 mg | ORAL_TABLET | Freq: Four times a day (QID) | ORAL | Status: DC | PRN
  Administered 2014-06-02: 650 mg via ORAL
  Filled 2014-05-31: qty 2

## 2014-05-31 MED ORDER — FUROSEMIDE 10 MG/ML IJ SOLN
40.0000 mg | Freq: Once | INTRAMUSCULAR | Status: AC
  Administered 2014-05-31: 40 mg via INTRAVENOUS
  Filled 2014-05-31: qty 4

## 2014-05-31 MED ORDER — HYDROCODONE-ACETAMINOPHEN 5-325 MG PO TABS: 1.0000 | ORAL_TABLET | ORAL | Status: DC | PRN

## 2014-05-31 MED ORDER — ONDANSETRON HCL 4 MG PO TABS: 4.0000 mg | ORAL_TABLET | Freq: Four times a day (QID) | ORAL | Status: DC | PRN

## 2014-05-31 MED ORDER — ALBUTEROL (5 MG/ML) CONTINUOUS INHALATION SOLN
10.0000 mg/h | INHALATION_SOLUTION | RESPIRATORY_TRACT | Status: DC
  Administered 2014-05-31: 10 mg/h via RESPIRATORY_TRACT
  Filled 2014-05-31: qty 20

## 2014-05-31 MED ORDER — NALOXONE HCL 0.4 MG/ML IJ SOLN
INTRAMUSCULAR | Status: AC
  Administered 2014-05-31: 0.4 mg via INTRAVENOUS
  Filled 2014-05-31: qty 2

## 2014-05-31 MED ORDER — LEVALBUTEROL HCL 1.25 MG/0.5ML IN NEBU: 1.2500 mg | INHALATION_SOLUTION | Freq: Once | RESPIRATORY_TRACT | Status: DC

## 2014-05-31 MED ORDER — SODIUM CHLORIDE 0.9 % IJ SOLN
3.0000 mL | Freq: Two times a day (BID) | INTRAMUSCULAR | Status: DC
Start: 2014-05-31 — End: 2014-06-02
  Administered 2014-05-31 – 2014-06-02 (×4): 3 mL via INTRAVENOUS

## 2014-05-31 MED ORDER — LEVOFLOXACIN IN D5W 750 MG/150ML IV SOLN
750.0000 mg | Freq: Once | INTRAVENOUS | Status: AC
  Administered 2014-05-31: 750 mg via INTRAVENOUS
  Filled 2014-05-31: qty 150

## 2014-05-31 MED ORDER — IPRATROPIUM-ALBUTEROL 0.5-2.5 (3) MG/3ML IN SOLN
3.0000 mL | RESPIRATORY_TRACT | Status: DC
  Administered 2014-05-31 (×4): 3 mL via RESPIRATORY_TRACT
  Filled 2014-05-31 (×4): qty 3

## 2014-05-31 MED ORDER — LORAZEPAM 2 MG/ML IJ SOLN
INTRAMUSCULAR | Status: AC
  Administered 2014-05-31: 1 mg via INTRAVENOUS
  Filled 2014-05-31: qty 1

## 2014-05-31 MED ORDER — LORAZEPAM 2 MG/ML IJ SOLN
1.0000 mg | Freq: Once | INTRAMUSCULAR | Status: AC
  Administered 2014-05-31: 1 mg via INTRAVENOUS

## 2014-05-31 MED ORDER — ACETAMINOPHEN 650 MG RE SUPP: 650.0000 mg | Freq: Four times a day (QID) | RECTAL | Status: DC | PRN

## 2014-05-31 MED ORDER — HYDROMORPHONE HCL 1 MG/ML IJ SOLN
1.0000 mg | INTRAMUSCULAR | Status: DC | PRN
  Administered 2014-05-31 – 2014-06-01 (×6): 1 mg via INTRAVENOUS
  Filled 2014-05-31 (×6): qty 1

## 2014-05-31 MED ORDER — HEPARIN SODIUM (PORCINE) 5000 UNIT/ML IJ SOLN
5000.0000 [IU] | Freq: Three times a day (TID) | INTRAMUSCULAR | Status: DC
  Administered 2014-05-31 – 2014-06-02 (×7): 5000 [IU] via SUBCUTANEOUS
  Filled 2014-05-31 (×8): qty 1

## 2014-05-31 MED ORDER — ONDANSETRON HCL 4 MG/2ML IJ SOLN: 4.0000 mg | Freq: Four times a day (QID) | INTRAMUSCULAR | Status: DC | PRN

## 2014-05-31 MED ORDER — IPRATROPIUM-ALBUTEROL 0.5-2.5 (3) MG/3ML IN SOLN
3.0000 mL | Freq: Three times a day (TID) | RESPIRATORY_TRACT | Status: DC
  Administered 2014-06-01 – 2014-06-02 (×5): 3 mL via RESPIRATORY_TRACT
  Filled 2014-05-31 (×6): qty 3

## 2014-05-31 MED ORDER — NALOXONE HCL 0.4 MG/ML IJ SOLN
0.4000 mg | Freq: Once | INTRAMUSCULAR | Status: AC
  Administered 2014-05-31: 0.4 mg via INTRAVENOUS

## 2014-05-31 MED ORDER — GUAIFENESIN-DM 100-10 MG/5ML PO SYRP
5.0000 mL | ORAL_SOLUTION | ORAL | Status: DC | PRN
  Filled 2014-05-31: qty 5

## 2014-05-31 MED ORDER — SODIUM CHLORIDE 0.9 % IV SOLN
INTRAVENOUS | Status: DC
  Administered 2014-05-31: 11:00:00 via INTRAVENOUS

## 2014-05-31 MED ORDER — LEVALBUTEROL HCL 1.25 MG/0.5ML IN NEBU: 0.6300 mg | INHALATION_SOLUTION | Freq: Once | RESPIRATORY_TRACT | Status: DC

## 2014-05-31 MED ORDER — DEXTROSE 5 % IV SOLN
500.0000 mg | INTRAVENOUS | Status: DC
  Administered 2014-06-01: 500 mg via INTRAVENOUS
  Filled 2014-05-31 (×2): qty 500

## 2014-05-31 NOTE — ED Notes (Signed)
Took patient to room, placed patient on cannual at 2 ltrs. O2. Nurse and respiratory therapist with patient.

## 2014-05-31 NOTE — Progress Notes (Signed)
Attending Physician paged x 2 for admission.

## 2014-05-31 NOTE — ED Notes (Addendum)
Pt brought to the ED by her husband who states that patient has not been "acting right" since 7pm this evening. Pt has been lethargic since 7pm per husband. States he checked her 02 sat was in the 80's today and has been doing several "breathing treatments" states she wears home 02 but does not consistently wear it. Husband states she is a smoker. Also states she has been c/o general h/a that is worse than normal. Husband states her oxycodone dose has recently been increased but states he manages that medication. Last oxycodone was given at 2pm. Pt presents alert to person and place but not time. Pt drowsy but will wake when name called. 02 sats 82% on RA and 02 placed at 2l via n/c and sats increased to 93%. Unable to verify home medications at this time. Husband does not have the list with him.

## 2014-05-31 NOTE — ED Provider Notes (Signed)
CSN: [id number]     Arrival date & time 05/31/14  0149 History   First MD Initiated Contact with Patient 05/31/14 0203     Chief Complaint  Patient presents with  . Altered Mental Status     (Consider location/radiation/quality/duration/timing/severity/associated sxs/prior Treatment) HPI The patient is brought in by her husband for altered mental status since this evening around 7 PM. He states the patient has been increasingly lethargic. She has home oxygen and is supposed to be wearing 2 L via nasal cannula. Husband reports that the patient is inconsistent with this. Patient found to have O2 sats in the low 80s. Husband also states the patient's pain medication has recently been increased. Patient takes OxyContin twice daily and oxycodone for breakthrough pain. The husband does not believe the patient has had any other medication or alcohol. Patient has increased wheezing has had several breathing treatments. At this time patient is drowsy and unable to contribute history. Level V caveat applies. Past Medical History  Diagnosis Date  . COPD (chronic obstructive pulmonary disease)   . Emphysema   . Bronchitis   . Hypertension   . Asthma   . Hyperlipidemia    Past Surgical History  Procedure Laterality Date  . Back surgery    . Abdominal hysterectomy    . Appendectomy     Family History  Problem Relation Age of Onset  . Emphysema Sister     smoked  . Heart disease Sister   . Ovarian cancer Sister    History  Substance Use Topics  . Smoking status: Current Every Day Smoker -- 1.50 packs/day for 30 years    Types: Cigarettes  . Smokeless tobacco: Never Used     Comment: Pt stated she has not smoked since 04/07/2014.  Marland Kitchen Alcohol Use: No   OB History    No data available     Review of Systems  Unable to perform ROS     Allergies  Review of patient's allergies indicates no known allergies.  Home Medications   Prior to Admission medications   Medication Sig Start Date  End Date Taking? Authorizing Provider  albuterol (PROAIR HFA) 108 (90 BASE) MCG/ACT inhaler 2 puffs every 4 hours as needed only  if your can't catch your breath 04/02/14  Yes Nyoka Cowden, MD  albuterol (PROVENTIL) (2.5 MG/3ML) 0.083% nebulizer solution Take 3 mLs (2.5 mg total) by nebulization every 6 (six) hours as needed for wheezing or shortness of breath. 04/02/14  Yes Nyoka Cowden, MD  amLODipine (NORVASC) 10 MG tablet Take 10 mg by mouth daily.   Yes Historical Provider, MD  aspirin 81 MG tablet Take 81 mg by mouth daily.   Yes Historical Provider, MD  budesonide-formoterol (SYMBICORT) 160-4.5 MCG/ACT inhaler Inhale 2 puffs into the lungs 2 (two) times daily.   Yes Historical Provider, MD  CALCIUM PO Take 1 tablet by mouth daily.   Yes Historical Provider, MD  Multiple Vitamin (MULTIVITAMIN WITH MINERALS) TABS Take 1 tablet by mouth daily.   Yes Historical Provider, MD  olmesartan (BENICAR) 20 MG tablet 1/2 tab by mouth daily 04/16/14  Yes Tammy S Parrett, NP  Oxycodone HCl 10 MG TABS Take 10 mg by mouth every 6 (six) hours as needed (breakthrough pain). For pain   Yes Historical Provider, MD  OXYCONTIN 20 MG T12A 12 hr tablet Take 20 mg by mouth every 12 (twelve) hours. 05/28/14  Yes Historical Provider, MD  POTASSIUM PO Take 1 tablet by mouth daily.  Yes Historical Provider, MD  pravastatin (PRAVACHOL) 40 MG tablet Take 40 mg by mouth daily.   Yes Historical Provider, MD  rOPINIRole (REQUIP) 0.25 MG tablet Take 0.5 mg by mouth daily.    Yes Historical Provider, MD  tiotropium (SPIRIVA) 18 MCG inhalation capsule Place 18 mcg into inhaler and inhale daily.   Yes Historical Provider, MD  traMADol (ULTRAM) 50 MG tablet 1-2 every 4 hours as needed for cough or pain Patient taking differently: Take 50-100 mg by mouth every 4 (four) hours as needed for moderate pain (cough).  04/02/14  Yes Nyoka CowdenMichael B Wert, MD   BP 133/67 mmHg  Pulse 78  Temp(Src) 98.2 F (36.8 C) (Oral)  Resp 17  Ht 5\' 7"   (1.702 m)  Wt 163 lb 2.3 oz (74 kg)  BMI 25.55 kg/m2  SpO2 98% Physical Exam  Constitutional: She appears well-developed and well-nourished. No distress.  Drowsy but arousable.  HENT:  Head: Normocephalic and atraumatic.  Mouth/Throat: Oropharynx is clear and moist. No oropharyngeal exudate.  Eyes: EOM are normal. Pupils are equal, round, and reactive to light.  Pupils 2 mm and sluggish.  Neck: Normal range of motion. Neck supple.  No meningismus  Cardiovascular: Regular rhythm.  Exam reveals no gallop and no friction rub.   No murmur heard. Tachycardia  Pulmonary/Chest: Effort normal. No respiratory distress. She has wheezes. She has no rales.  Expiratory wheezing throughout  Abdominal: Soft. Bowel sounds are normal. She exhibits no distension and no mass. There is no tenderness. There is no rebound.  Musculoskeletal: Normal range of motion. She exhibits no edema or tenderness.  Neurological:  Patient is oriented to person. Arouses and follow simple commands. Appears to be moving all extremities. Sensation grossly intact.  Skin: Skin is warm and dry. No rash noted. No erythema.  Nursing note and vitals reviewed.   ED Course  Procedures (including critical care time) Labs Review Labs Reviewed  CBC WITH DIFFERENTIAL/PLATELET - Abnormal; Notable for the following:    WBC 23.7 (*)    Neutrophils Relative % 87 (*)    Neutro Abs 20.7 (*)    Lymphocytes Relative 5 (*)    Monocytes Absolute 1.6 (*)    All other components within normal limits  COMPREHENSIVE METABOLIC PANEL - Abnormal; Notable for the following:    Sodium 131 (*)    Chloride 92 (*)    Glucose, Bld 121 (*)    Creatinine, Ser 0.47 (*)    All other components within normal limits  URINALYSIS, ROUTINE W REFLEX MICROSCOPIC - Abnormal; Notable for the following:    APPearance CLOUDY (*)    Hgb urine dipstick TRACE (*)    Ketones, ur 40 (*)    Protein, ur 100 (*)    All other components within normal limits  URINE  RAPID DRUG SCREEN (HOSP PERFORMED) - Abnormal; Notable for the following:    Opiates POSITIVE (*)    All other components within normal limits  TROPONIN I - Abnormal; Notable for the following:    Troponin I 0.05 (*)    All other components within normal limits  BRAIN NATRIURETIC PEPTIDE - Abnormal; Notable for the following:    B Natriuretic Peptide 462.1 (*)    All other components within normal limits  TROPONIN I - Abnormal; Notable for the following:    Troponin I 0.38 (*)    All other components within normal limits  TROPONIN I - Abnormal; Notable for the following:    Troponin I 0.08 (*)  All other components within normal limits  BASIC METABOLIC PANEL - Abnormal; Notable for the following:    Sodium 132 (*)    Chloride 91 (*)    Creatinine, Ser 0.49 (*)    All other components within normal limits  CBC - Abnormal; Notable for the following:    WBC 16.5 (*)    All other components within normal limits  CBG MONITORING, ED - Abnormal; Notable for the following:    Glucose-Capillary 120 (*)    All other components within normal limits  I-STAT ARTERIAL BLOOD GAS, ED - Abnormal; Notable for the following:    pH, Arterial 7.316 (*)    pCO2 arterial 52.5 (*)    pO2, Arterial 66.0 (*)    Bicarbonate 26.7 (*)    All other components within normal limits  MRSA PCR SCREENING  CULTURE, BLOOD (ROUTINE X 2)  CULTURE, BLOOD (ROUTINE X 2)  ETHANOL  URINE MICROSCOPIC-ADD ON  PROTIME-INR  TSH  TROPONIN I  HEMOGLOBIN A1C  I-STAT CG4 LACTIC ACID, ED    Imaging Review Ct Head Wo Contrast  05/31/2014   CLINICAL DATA:  Initial evaluation for acute altered mental status.  EXAM: CT HEAD WITHOUT CONTRAST  TECHNIQUE: Contiguous axial images were obtained from the base of the skull through the vertex without intravenous contrast.  COMPARISON:  None available.  FINDINGS: The is degraded by motion artifact.  No acute large vessel territory infarct or intracranial hemorrhage. Gray-white matter  differentiation maintained.  No mass lesion or midline shift. No hydrocephalus. No extra-axial fluid collection.  Scalp soft tissues within normal limits. No acute abnormality seen about the orbits.  Paranasal sinuses and mastoid air cells are clear. Calvarium intact.  IMPRESSION: Somewhat limited study due to motion with no definite acute intracranial abnormality identified.   Electronically Signed   By: Rise Mu M.D.   On: 05/31/2014 06:27   Dg Chest Port 1 View  05/31/2014   CLINICAL DATA:  Lethargy.  Decreased O2 sats.  COPD.  EXAM: PORTABLE CHEST - 1 VIEW  COMPARISON:  Chest radiograph 03/1914.  FINDINGS: The heart is enlarged. Aortic atherosclerosis is present. The patient has undergone thoracolumbar fusion with pedicle screws and rods. Dorsal column stimulator is redemonstrated within the upper thoracic canal.  Diffuse interstitial prominence of the lungs without focal consolidation. This could represent worsening of chronic bronchitis, viral infection, atypical edema, or inhalational related canal origin. Worsening aeration compared with priors. Osseous demineralization.  IMPRESSION: Diffuse interstitial prominence of the lungs has worsened compared with priors. Correlate clinically for atypical CHF, versus inflammatory/ infectious etiology.   Electronically Signed   By: Davonna Belling M.D.   On: 05/31/2014 03:47     EKG Interpretation None      MDM   Final diagnoses:  Hypoxia  Narcotic overdose, accidental or unintentional, initial encounter  COPD exacerbation    Patient given small dose of Narcan emergency department. Began immediately to become more awake and alert and agitated and crying out for pain medication. Patient guards sedation. Chest x-ray with possible pneumonia. Patient does have an increase in her white blood cell count though husband reports she's been on steroids recently. Patient continues to require increased oxygen. Will discuss with hospitalist about  possible transfer and admission.    Loren Racer, MD 06/01/14 [phone]

## 2014-05-31 NOTE — Progress Notes (Signed)
Received pt from [facility] on stretcher, monitor and Nasal O2 at 3L.Marland Kitchen. Pt aggitated and restless.  Oriented to person and place. MAE and will folow some commands. VS obtained . CHG bath administered.Pt swabbed for MRSA. Admitting Physician paged.

## 2014-05-31 NOTE — H&P (Signed)
[facility] History and Physical  [name] UEA:[id number] DOB: 1951-01-13 DOA: 05/31/2014  Referring physician: EDP PCP: Delorse Lek, MD   Chief Complaint: Lethargy  HPI: [name] is a 64 y.o. female with past medical history of COPD and chronic respiratory failure on 2 L of oxygen but not very compliant with it, brought into the hospital because of altered mental status and lethargy. History was taken from husband and daughter at bedside, the patient was able to minimally contribute to the history telling. Apparently she was okay until early evening yesterday when her husband found her to be less responsive, and increasingly lethargic. Patient brought to the [facility] and found also to be hypoxic. Apparently no reported fever or chills. But patient endorses productive cough and shortness of breath lately. Patient takes 10 mg of OxyIR every 6 hours severe chronic back pain, but recently on the 21st she was started on OxyContin 10 mg every 12 hours. In the ED ABG was done and showed pH of 7.316, PCO2 of 52 and PO2 of 66, sodium of 131 and WBC of 23.7, patient admitted to the hospital for further evaluation.  Review of Systems:  Constitutional: negative for anorexia, fevers and sweats Eyes: negative for irritation, redness and visual disturbance Ears, nose, mouth, throat, and face: negative for earaches, epistaxis, nasal congestion and sore throat Respiratory: negative for cough, dyspnea on exertion, sputum and wheezing Cardiovascular: negative for chest pain, dyspnea, lower extremity edema, orthopnea, palpitations and syncope Gastrointestinal: negative for abdominal pain, constipation, diarrhea, melena, nausea and vomiting Genitourinary:negative for dysuria, frequency and hematuria Hematologic/lymphatic: negative for bleeding, easy bruising and lymphadenopathy Musculoskeletal: Severe back pain Neurological: negative for coordination problems, gait problems,  headaches and weakness Endocrine: negative for diabetic symptoms including polydipsia, polyuria and weight loss Allergic/Immunologic: negative for anaphylaxis, hay fever and urticaria  Past Medical History  Diagnosis Date  . COPD (chronic obstructive pulmonary disease)   . Emphysema   . Bronchitis   . Hypertension   . Asthma   . Hyperlipidemia    Past Surgical History  Procedure Laterality Date  . Back surgery    . Abdominal hysterectomy    . Appendectomy     Social History:   reports that she has been smoking Cigarettes.  She has a 45 pack-year smoking history. She has never used smokeless tobacco. She reports that she does not drink alcohol or use illicit drugs.  No Known Allergies  Family History  Problem Relation Age of Onset  . Emphysema Sister     smoked  . Heart disease Sister   . Ovarian cancer Sister    Prior to Admission medications   Medication Sig Start Date End Date Taking? Authorizing Provider  albuterol (PROAIR HFA) 108 (90 BASE) MCG/ACT inhaler 2 puffs every 4 hours as needed only  if your can't catch your breath 04/02/14   Nyoka Cowden, MD  albuterol (PROVENTIL) (2.5 MG/3ML) 0.083% nebulizer solution Take 3 mLs (2.5 mg total) by nebulization every 6 (six) hours as needed for wheezing or shortness of breath. 04/02/14   Nyoka Cowden, MD  amLODipine (NORVASC) 10 MG tablet Take 10 mg by mouth daily.    Historical Provider, MD  amoxicillin-clavulanate (AUGMENTIN) 875-125 MG per tablet Take 1 tablet by mouth 2 (two) times daily. 04/09/14   Waymon Budge, MD  aspirin 81 MG tablet Take 81 mg by mouth daily.    Historical Provider, MD  benzonatate (TESSALON) 100 MG capsule Take by  mouth 3 (three) times daily as needed for cough.    Historical Provider, MD  budesonide-formoterol (SYMBICORT) 160-4.5 MCG/ACT inhaler Inhale 2 puffs into the lungs 2 (two) times daily.    Historical Provider, MD  CALCIUM PO Take 1 tablet by mouth daily.    Historical Provider, MD    cyclobenzaprine (FLEXERIL) 10 MG tablet Take 10 mg by mouth 3 (three) times daily as needed for muscle spasms.    Historical Provider, MD  dextromethorphan-guaiFENesin (MUCINEX DM) 30-600 MG per 12 hr tablet Take 1 tablet by mouth 2 (two) times daily as needed for cough.    Historical Provider, MD  Ibuprofen-Diphenhydramine HCl 200-25 MG CAPS Take 1 tablet by mouth at bedtime as needed.    Historical Provider, MD  Multiple Vitamin (MULTIVITAMIN WITH MINERALS) TABS Take 1 tablet by mouth daily.    Historical Provider, MD  olmesartan (BENICAR) 20 MG tablet 1/2 tab by mouth daily 04/16/14   Tammy S Parrett, NP  Oxycodone HCl 10 MG TABS Take 10 mg by mouth every 4 (four) hours as needed. For pain    Historical Provider, MD  POTASSIUM PO Take 1 tablet by mouth daily.    Historical Provider, MD  pravastatin (PRAVACHOL) 40 MG tablet Take 40 mg by mouth daily.    Historical Provider, MD  rOPINIRole (REQUIP) 0.25 MG tablet Take 0.25 mg by mouth 3 (three) times daily.    Historical Provider, MD  tiotropium (SPIRIVA) 18 MCG inhalation capsule Place 18 mcg into inhaler and inhale daily.    Historical Provider, MD  traMADol Janean Sark) 50 MG tablet 1-2 every 4 hours as needed for cough or pain 04/02/14   Nyoka Cowden, MD   Physical Exam: Filed Vitals:   05/31/14 0800  BP: 159/106  Pulse: 118  Temp:   Resp: 26   Constitutional: Oriented to person, place, and time. Well-developed and well-nourished. Cooperative.  Head: Normocephalic and atraumatic.  Nose: Nose normal.  Mouth/Throat: Uvula is midline, oropharynx is clear and moist and mucous membranes are normal.  Eyes: Conjunctivae and EOM are normal. Pupils are equal, round, and reactive to light.  Neck: Trachea normal and normal range of motion. Neck supple.  Cardiovascular: Normal rate, regular rhythm, S1 normal, S2 normal, normal heart sounds and intact distal pulses.   Pulmonary/Chest: Bilateral rhonchi. Abdominal: Soft. Bowel sounds are normal.  There is no hepatosplenomegaly. There is no tenderness.  Musculoskeletal: Bilateral +1 pedal edema. Neurological: Alert and oriented to person, place, and time. Has normal strength. No cranial nerve deficit or sensory deficit.  Skin: Skin is warm, dry and intact.  Psychiatric: Has a normal mood and affect. Speech is normal and behavior is normal.   Labs on Admission:  Basic Metabolic Panel:  Recent Labs Lab 05/31/14 0215  NA 131*  K 3.8  CL 92*  CO2 28  GLUCOSE 121*  BUN 9  CREATININE 0.47*  CALCIUM 8.6   Liver Function Tests:  Recent Labs Lab 05/31/14 0215  AST 16  ALT 15  ALKPHOS 84  BILITOT 0.7  PROT 6.7  ALBUMIN 3.5   No results for input(s): LIPASE, AMYLASE in the last 168 hours. No results for input(s): AMMONIA in the last 168 hours. CBC:  Recent Labs Lab 05/31/14 0215  WBC 23.7*  NEUTROABS 20.7*  HGB 14.0  HCT 42.1  MCV 90.5  PLT 308   Cardiac Enzymes:  Recent Labs Lab 05/31/14 0500  TROPONINI 0.05*    BNP (last 3 results)  Recent Labs  03/27/14 1850  BNP 103.8*    ProBNP (last 3 results) No results for input(s): PROBNP in the last 8760 hours.  CBG:  Recent Labs Lab 05/31/14 0214  GLUCAP 120*    Radiological Exams on Admission: Ct Head Wo Contrast  05/31/2014   CLINICAL DATA:  Initial evaluation for acute altered mental status.  EXAM: CT HEAD WITHOUT CONTRAST  TECHNIQUE: Contiguous axial images were obtained from the base of the skull through the vertex without intravenous contrast.  COMPARISON:  None available.  FINDINGS: The is degraded by motion artifact.  No acute large vessel territory infarct or intracranial hemorrhage. Gray-white matter differentiation maintained.  No mass lesion or midline shift. No hydrocephalus. No extra-axial fluid collection.  Scalp soft tissues within normal limits. No acute abnormality seen about the orbits.  Paranasal sinuses and mastoid air cells are clear. Calvarium intact.  IMPRESSION: Somewhat  limited study due to motion with no definite acute intracranial abnormality identified.   Electronically Signed   By: Rise MuBenjamin  McClintock M.D.   On: 05/31/2014 06:27   Dg Chest Port 1 View  05/31/2014   CLINICAL DATA:  Lethargy.  Decreased O2 sats.  COPD.  EXAM: PORTABLE CHEST - 1 VIEW  COMPARISON:  Chest radiograph 03/1914.  FINDINGS: The heart is enlarged. Aortic atherosclerosis is present. The patient has undergone thoracolumbar fusion with pedicle screws and rods. Dorsal column stimulator is redemonstrated within the upper thoracic canal.  Diffuse interstitial prominence of the lungs without focal consolidation. This could represent worsening of chronic bronchitis, viral infection, atypical edema, or inhalational related canal origin. Worsening aeration compared with priors. Osseous demineralization.  IMPRESSION: Diffuse interstitial prominence of the lungs has worsened compared with priors. Correlate clinically for atypical CHF, versus inflammatory/ infectious etiology.   Electronically Signed   By: Davonna BellingJohn  Curnes M.D.   On: 05/31/2014 03:47    EKG: Independently reviewed.   Assessment/Plan Principal Problem:   Lethargic Active Problems:   Essential hypertension   Tachycardia   Chronic respiratory failure with hypoxia   Hyponatremia   Chronic pain   CAP (community acquired pneumonia)   Sepsis    Lethargy Patient brought into the hospital because of lethargy, thought to be secondary to medication along with infection. She was given Narcan, with immediate improvement in mental status but she developed severe pain and for that. Stop oral narcotics and start on an as needed 1 mg of IV Dilaudid.  Community-acquired pneumonia Chest x-ray showed possible pneumonia, leukocytosis of 23.7 and productive cough. Start on Rocephin and azithromycin. Supportive treatment with mucolytics, bronchodilators, antitussives and oxygen.  Sepsis This is present on admission, sepsis criteria with  lymphocytes of 23.7, heart rate of 134 and presence of CAP. Start on antibiotics. Hold on aggressive hydration because of lower extremity edema.  Hyponatremia Appears to be chronic hyponatremia, check BMP in a.m.  Chronic pain Patient has chronic pain secondary to previous back surgeries. Per husband she has to rods in her back one of them is broken, she was on OxyIR 10 mg every 6 hours. Recently was started on OxyContin 10 mg every 12 hours along with OxyIR and Flexeril. Oral medication held, given Narcan, currently she is in severe pain, started 1 mg of Dilaudid IV every 4 hours as needed.  Bilateral pedal edema +1 bilateral pedal edema, check BNP, 2-D echo, 40 mg of Lasix given. Follow clinically, x-ray also showed possible fluid overload.  Chronic respiratory failure Patient has COPD, supposedly on 2 L of oxygen, according to her  husband she is not very compliant with oxygen. She still smokes, currently has coarse rhonchi bilaterally, but no wheezing. Continue to treat the pneumonia and follow clinically.   Code Status: Full code Family Communication: plan discussed with daughter and husband at bedside. Disposition Plan: Stepdown  Time spent: 70 minutes  Shiann Kam A, MD [facility] Pager [phone]

## 2014-06-01 DIAGNOSIS — I509 Heart failure, unspecified: Secondary | ICD-10-CM

## 2014-06-01 LAB — BASIC METABOLIC PANEL
Anion gap: 9 (ref 5–15)
BUN: 8 mg/dL (ref 6–23)
CALCIUM: 8.8 mg/dL (ref 8.4–10.5)
CHLORIDE: 91 mmol/L — AB (ref 96–112)
CO2: 32 mmol/L (ref 19–32)
Creatinine, Ser: 0.49 mg/dL — ABNORMAL LOW (ref 0.50–1.10)
GFR calc Af Amer: >90 mL/min (ref >90)
GFR calc non Af Amer: >90 mL/min (ref >90)
GLUCOSE: 85 mg/dL (ref 70–99)
Potassium: 3.9 mmol/L (ref 3.5–5.1)
Sodium: 132 mmol/L — ABNORMAL LOW (ref 135–145)

## 2014-06-01 LAB — HEMOGLOBIN A1C
Hgb A1c MFr Bld: 6.1 % — ABNORMAL HIGH (ref 4.8–5.6)
Mean Plasma Glucose: 128 mg/dL

## 2014-06-01 LAB — CBC
HEMATOCRIT: 39.3 % (ref 36.0–46.0)
HEMOGLOBIN: 13 g/dL (ref 12.0–15.0)
MCH: 29.5 pg (ref 26.0–34.0)
MCHC: 33.1 g/dL (ref 30.0–36.0)
MCV: 89.3 fL (ref 78.0–100.0)
PLATELETS: 319 10*3/uL (ref 150–400)
RBC: 4.4 MIL/uL (ref 3.87–5.11)
RDW: 14.3 % (ref 11.5–15.5)
WBC: 16.5 10*3/uL — ABNORMAL HIGH (ref 4.0–10.5)

## 2014-06-01 MED ORDER — OXYCODONE HCL ER 15 MG PO T12A
15.0000 mg | EXTENDED_RELEASE_TABLET | Freq: Two times a day (BID) | ORAL | Status: DC
  Administered 2014-06-01 – 2014-06-02 (×2): 15 mg via ORAL
  Filled 2014-06-01 (×4): qty 1

## 2014-06-01 MED ORDER — IRBESARTAN 75 MG PO TABS
75.0000 mg | ORAL_TABLET | Freq: Every day | ORAL | Status: DC
  Administered 2014-06-01 – 2014-06-02 (×2): 75 mg via ORAL
  Filled 2014-06-01 (×2): qty 1

## 2014-06-01 MED ORDER — AMOXICILLIN-POT CLAVULANATE 500-125 MG PO TABS
1.0000 | ORAL_TABLET | Freq: Three times a day (TID) | ORAL | Status: DC
  Administered 2014-06-01 – 2014-06-02 (×3): 500 mg via ORAL
  Filled 2014-06-01 (×6): qty 1

## 2014-06-01 MED ORDER — ROPINIROLE HCL 0.5 MG PO TABS
0.5000 mg | ORAL_TABLET | Freq: Every day | ORAL | Status: DC
  Administered 2014-06-01: 0.5 mg via ORAL
  Filled 2014-06-01 (×2): qty 1

## 2014-06-01 MED ORDER — BUDESONIDE 0.25 MG/2ML IN SUSP
0.2500 mg | Freq: Two times a day (BID) | RESPIRATORY_TRACT | Status: DC
  Administered 2014-06-01 – 2014-06-02 (×2): 0.25 mg via RESPIRATORY_TRACT
  Filled 2014-06-01 (×4): qty 2

## 2014-06-01 MED ORDER — OXYCODONE HCL 5 MG PO TABS
5.0000 mg | ORAL_TABLET | Freq: Four times a day (QID) | ORAL | Status: DC | PRN
  Administered 2014-06-01 – 2014-06-02 (×4): 5 mg via ORAL
  Filled 2014-06-01 (×5): qty 1

## 2014-06-01 NOTE — Progress Notes (Signed)
Echocardiogram 2D Echocardiogram has been performed.  Dorothey BasemanReel, Jalaine Riggenbach M 06/01/2014, 12:54 PM

## 2014-06-01 NOTE — Progress Notes (Signed)
CAP: on rocephin/azith D2  -WBC= 16.5 and down  Plan -consider to po in am if WBC continues to improve

## 2014-06-01 NOTE — Progress Notes (Signed)
[facility] PROGRESS NOTE  Dustin FolksRebecca E Peddy EAV:[id number] DOB: 1950/11/19 DOA: 05/31/2014 PCP: Delorse LekBURNETT,BRENT A, MD  Assessment/Plan: 1-acute on chronic resp failure with hypoxia: due to CAP/aspiration and lethargy from narcotics -patient with dramatic improvement to use of narcan -concerns for aspiration to CXR findings for PNA and the fact patient was ok prior to episode of excesive lethargy due to narcotics -will switch to PO antibiotics -she is afebrile, WBC's trending down and good O2 sat -will repeat CXR in am -continue Duonebs and pulmicort -follow clinical response  2-chronic resp failure due to COPD: patient with mild exp wheezing on exam -will continue albuterol/atrovent and will add pulmicort -was on spiriva at home; holding it while on atrovent -receiving antibiotics for PNA and that will cover for bronchiectasis as well -will start flutter valve  3-Sepsis features: present on admission -basically resolved and driven by PNA  -treatment as mentioned above  4-chronic pain: will resume home regimen at lower dose and monitor  5-bilateral pedal edema and mildly elevated BNP: -neg troponin -no JVD or crackles on exam -will follow 2-D echo -continue low sodium diet -daily weights and strict intake and Output  6-HTN: will continue ARB -BP well controlled  Code Status: Full Family Communication: husband at bedside Disposition Plan: will move to med-surg bed; continue tx for PNA, follow CXR in am and resume home pain medications at a lower rate   Consultants:  None   Procedures:  See below for x-ray reports  2-D echo: pending  Antibiotics:  Zithromax/rocephin: 4/24>>>4/25  Augmentin 4/25   HPI/Subjective: Afebrile, AAOX3, complaining of severe back pain. No nausea, vomiting or CP. Patient with good O2 sat on her chronic oxygen supplementation.  Objective: Filed Vitals:   06/01/14 1234  BP: 139/65  Pulse: 84  Temp: 97.8 F (36.6 C)  Resp: 18     Intake/Output Summary (Last 24 hours) at 06/01/14 1504 Last data filed at 06/01/14 1400  Gross per 24 hour  Intake    743 ml  Output    600 ml  Net    143 ml   Filed Weights   05/31/14 0202 05/31/14 0700  Weight: 72.576 kg (160 lb) 74 kg (163 lb 2.3 oz)    Exam:   General:  Feeling better, AAOX3 and with main complain of pain in her back (chronic and currently severe, due to reversal of narcotics); no CP, no fever.  Cardiovascular: S1 and S2, no rubs or gallops; neg JVD  Respiratory: positive exp wheezing, diffuse rhonchi and decrease BS at bases (no frank crackles)  Abdomen: soft, NT, ND, positive BS  Musculoskeletal: no edema or cyanosis   Data Reviewed: Basic Metabolic Panel:  Recent Labs Lab 05/31/14 0215 06/01/14 0301  NA 131* 132*  K 3.8 3.9  CL 92* 91*  CO2 28 32  GLUCOSE 121* 85  BUN 9 8  CREATININE 0.47* 0.49*  CALCIUM 8.6 8.8   Liver Function Tests:  Recent Labs Lab 05/31/14 0215  AST 16  ALT 15  ALKPHOS 84  BILITOT 0.7  PROT 6.7  ALBUMIN 3.5   CBC:  Recent Labs Lab 05/31/14 0215 06/01/14 0301  WBC 23.7* 16.5*  NEUTROABS 20.7*  --   HGB 14.0 13.0  HCT 42.1 39.3  MCV 90.5 89.3  PLT 308 319   Cardiac Enzymes:  Recent Labs Lab 05/31/14 0500 05/31/14 0942 05/31/14 1445 05/31/14 2039  TROPONINI 0.05* 0.38* 0.03 0.08*   BNP (last 3 results)  Recent Labs  03/27/14 1850 05/31/14 [id number]  BNP 103.8* 462.1*   CBG:  Recent Labs Lab 05/31/14 0214  GLUCAP 120*    Recent Results (from the past 240 hour(s))  Blood culture (routine x 2)     Status: None (Preliminary result)   Collection Time: 05/31/14  3:10 AM  Result Value Ref Range Status   Specimen Description BLOOD RIGHT ARM  Final   Special Requests BOTTLES DRAWN AEROBIC AND ANAEROBIC EACH  Final   Culture   Final           BLOOD CULTURE RECEIVED NO GROWTH TO DATE CULTURE WILL BE HELD FOR 5 DAYS BEFORE ISSUING A FINAL NEGATIVE REPORT Performed at [facility]    Report Status PENDING  Incomplete  Blood culture (routine x 2)     Status: None (Preliminary result)   Collection Time: 05/31/14  5:00 AM  Result Value Ref Range Status   Specimen Description BLOOD LEFT HAND  Final   Special Requests BOTTLES DRAWN AEROBIC AND ANAEROBIC 10CC EACH  Final   Culture   Final           BLOOD CULTURE RECEIVED NO GROWTH TO DATE CULTURE WILL BE HELD FOR 5 DAYS BEFORE ISSUING A FINAL NEGATIVE REPORT Performed at [facility]    Report Status PENDING  Incomplete  MRSA PCR Screening     Status: None   Collection Time: 05/31/14  7:04 AM  Result Value Ref Range Status   MRSA by PCR NEGATIVE NEGATIVE Final    Comment:        The GeneXpert MRSA Assay (FDA approved for NASAL specimens only), is one component of a comprehensive MRSA colonization surveillance program. It is not intended to diagnose MRSA infection nor to guide or monitor treatment for MRSA infections.      Studies: Ct Head Wo Contrast  05/31/2014   CLINICAL DATA:  Initial evaluation for acute altered mental status.  EXAM: CT HEAD WITHOUT CONTRAST  TECHNIQUE: Contiguous axial images were obtained from the base of the skull through the vertex without intravenous contrast.  COMPARISON:  None available.  FINDINGS: The is degraded by motion artifact.  No acute large vessel territory infarct or intracranial hemorrhage. Gray-white matter differentiation maintained.  No mass lesion or midline shift. No hydrocephalus. No extra-axial fluid collection.  Scalp soft tissues within normal limits. No acute abnormality seen about the orbits.  Paranasal sinuses and mastoid air cells are clear. Calvarium intact.  IMPRESSION: Somewhat limited study due to motion with no definite acute intracranial abnormality identified.   Electronically Signed   By: Rise Mu M.D.   On: 05/31/2014 06:27   Dg Chest Port 1 View  05/31/2014   CLINICAL DATA:  Lethargy.  Decreased O2 sats.  COPD.  EXAM:  PORTABLE CHEST - 1 VIEW  COMPARISON:  Chest radiograph 03/1914.  FINDINGS: The heart is enlarged. Aortic atherosclerosis is present. The patient has undergone thoracolumbar fusion with pedicle screws and rods. Dorsal column stimulator is redemonstrated within the upper thoracic canal.  Diffuse interstitial prominence of the lungs without focal consolidation. This could represent worsening of chronic bronchitis, viral infection, atypical edema, or inhalational related canal origin. Worsening aeration compared with priors. Osseous demineralization.  IMPRESSION: Diffuse interstitial prominence of the lungs has worsened compared with priors. Correlate clinically for atypical CHF, versus inflammatory/ infectious etiology.   Electronically Signed   By: Davonna Belling M.D.   On: 05/31/2014 03:47    Scheduled Meds: . amoxicillin-clavulanate  1 tablet Oral TID  .  budesonide (PULMICORT) nebulizer solution  0.25 mg Nebulization BID  . guaiFENesin  1,200 mg Oral BID  . heparin  5,000 Units Subcutaneous 3 times per day  . ipratropium-albuterol  3 mL Nebulization TID  . OxyCODONE  15 mg Oral Q12H  . rOPINIRole  0.5 mg Oral QHS  . sodium chloride  3 mL Intravenous Q12H   Continuous Infusions: . sodium chloride 10 mL/hr at 05/31/14 1056    Principal Problem:   Lethargic Active Problems:   Essential hypertension   Tachycardia   Chronic respiratory failure with hypoxia   Hyponatremia   Chronic pain   CAP (community acquired pneumonia)   Sepsis    Time spent: 40 minutes (50% dedicated to face-face evaluation, data review/labs results, family update/explanation of condition and coordination in her plan of care)    Vassie Loll  [facility] Pager [phone]. If 7PM-7AM, please contact night-coverage at [email or web address], password [facility] 06/01/2014, 3:04 PM  LOS: 1 day

## 2014-06-02 ENCOUNTER — Inpatient Hospital Stay (HOSPITAL_COMMUNITY): Payer: Medicare Other

## 2014-06-02 DIAGNOSIS — T40601A Poisoning by unspecified narcotics, accidental (unintentional), initial encounter: Secondary | ICD-10-CM | POA: Insufficient documentation

## 2014-06-02 LAB — BASIC METABOLIC PANEL
ANION GAP: 10 (ref 5–15)
BUN: 6 mg/dL (ref 6–23)
CO2: 31 mmol/L (ref 19–32)
Calcium: 9 mg/dL (ref 8.4–10.5)
Chloride: 97 mmol/L (ref 96–112)
Creatinine, Ser: 0.51 mg/dL (ref 0.50–1.10)
GFR calc Af Amer: >90 mL/min (ref >90)
GLUCOSE: 86 mg/dL (ref 70–99)
Potassium: 4 mmol/L (ref 3.5–5.1)
Sodium: 138 mmol/L (ref 135–145)

## 2014-06-02 LAB — CBC
HEMATOCRIT: 41.7 % (ref 36.0–46.0)
Hemoglobin: 13.6 g/dL (ref 12.0–15.0)
MCH: 29.6 pg (ref 26.0–34.0)
MCHC: 32.6 g/dL (ref 30.0–36.0)
MCV: 90.7 fL (ref 78.0–100.0)
Platelets: 320 10*3/uL (ref 150–400)
RBC: 4.6 MIL/uL (ref 3.87–5.11)
RDW: 14.7 % (ref 11.5–15.5)
WBC: 9.8 10*3/uL (ref 4.0–10.5)

## 2014-06-02 MED ORDER — OXYCODONE HCL ER 15 MG PO T12A: 15.0000 mg | EXTENDED_RELEASE_TABLET | Freq: Two times a day (BID) | ORAL | Status: DC

## 2014-06-02 MED ORDER — AMOXICILLIN-POT CLAVULANATE 500-125 MG PO TABS: 1.0000 | ORAL_TABLET | Freq: Three times a day (TID) | ORAL | Status: DC

## 2014-06-02 MED ORDER — GUAIFENESIN ER 600 MG PO TB12: 600.0000 mg | ORAL_TABLET | Freq: Two times a day (BID) | ORAL | Status: DC

## 2014-06-02 MED ORDER — OXYCODONE HCL 5 MG PO TABS
5.0000 mg | ORAL_TABLET | Freq: Four times a day (QID) | ORAL | Status: DC | PRN
Start: 2014-06-02 — End: 2015-12-08

## 2014-06-02 NOTE — Discharge Summary (Signed)
Physician Discharge Summary  [name]:[id number] DOB: 1950/08/13 DOA: 05/31/2014  PCP: Delorse Lek, MD  Admit date: 05/31/2014 Discharge date: 06/02/2014  Time spent: >30 minutes  Recommendations for Outpatient Follow-up:  1. Repeat BMET to follow electrolytes and renal function 2. Repeat CBC to follow WBC's trend 3. Please help/assist patient with smoking cessation process 4. Repeat CXR in 3 weeks to follow resolution of infiltrates   Discharge Diagnoses:  Principal Problem:   Lethargic Active Problems:   Essential hypertension   Tachycardia   Chronic respiratory failure with hypoxia   Hyponatremia   Chronic pain   CAP (community acquired pneumonia)   Sepsis chronic diastolic heart failure Pedal edema due to use of amlodipine  Discharge Condition: stable and improved. Will follow in less than 10 days with pain management clinic and in 2 weeks with PCP  Diet recommendation: heart healthy/low sodium diet  Filed Weights   05/31/14 0202 05/31/14 0700 06/02/14 0442  Weight: 72.576 kg (160 lb) 74 kg (163 lb 2.3 oz) 71.4 kg (157 lb 6.5 oz)    History of present illness:  64 y.o. female with past medical history of COPD and chronic respiratory failure on 2 L of oxygen but not very compliant with it, brought into the hospital because of altered mental status and lethargy. History was taken from husband and daughter at bedside, the patient was able to minimally contribute to the history due to lethargy state. Apparently she was okay until early evening yesterday when her husband found her to be less responsive, and increasingly lethargic. Patient's was brought to the [facility] and found also to be hypoxic. Apparently no reported fever or chills. Of note, Patient takes 10 mg of OxyIR every 6 hours severe chronic back pain, but recently on the 21st she was started on OxyContin 20 mg every 12 hours. Husband endorses some difficulty properly understanding how to make  transition.  In the ED ABG was done and showed pH of 7.316, PCO2 of 52 and PO2 of 66, sodium of 131 and WBC of 23.7, patient admitted to the hospital for further evaluation.  Hospital Course:  1-acute on chronic resp failure with hypoxia: due to CAP/aspiration and lethargy from narcotics -patient with dramatic improvement after use of narcan -concerns for aspiration to CXR findings for PNA and the fact patient was ok prior to episode of excesive lethargy due to narcotics -will discharge on augmentin by mouth to complete 7 more days -she is afebrile, WBC's WNL and good O2 sat on RA at rest; she endorses intermittent use of O2 at home -will benefit of repeated CXR in 3 weeks after discharge to follow resolution of infiltrates  2-chronic resp failure due to COPD: patient with mild exp wheezing on exam -will continue albuterol, spiriva and symbicort -receiving antibiotics for PNA and that will cover for bronchiectasis as well -will advise to continue using  flutter valve -patient advise to quit smoking  3-Sepsis features: present on admission -basically resolved and driven by aspiration PNA process -treatment as mentioned above  4-chronic pain:  -patient instructed/educated on how to take medications -dose aslo adjusted to decrease chances for lethargy and hypercapnia -at discharge plan is for oxycontin 15mg  Q12hours and oxycodone IR 5 mg Q6 hours for breakthrough   5-bilateral pedal edema and mildly elevated BNP: see below explanation for Grad 1 diastolic HF, chronic and compensated -neg troponin -no JVD or crackles on exam -2-D echo with preserved EF, no wall motion abnormalities and just  grade 1 diastolic dysfunction -continue low sodium diet and daily weights  -patient with pedal edema most likely from use of amlodipine  6-HTN: will continue ARB -BP well controlled -advise to follow low sodium diet  7-chronic diastolic heart failure: compensated  -advise to follow low sodium  diet and to take BP meds as prescribed  8-tobacco abuse: cessation counseling -patient not ready to quit -but endorses will start cutting down  Procedures:  See below for x-ray reports   2-D echo: Study Conclusions - Left ventricle: The cavity size was normal. Wall thickness was normal. Systolic function was normal. The estimated ejection fraction was in the range of 55% to 60%. Wall motion was normal; there were no regional wall motion abnormalities. Doppler parameters are consistent with abnormal left ventricular relaxation (grade 1 diastolic dysfunction). - Mitral valve: Calcified annulus. - Pericardium, extracardiac: A trivial pericardial effusion was identified.  Impressions: - Normal LV function; grade 1 diastolic dysfunction; trace TR.  Consultations:  None   Discharge Exam: Filed Vitals:   06/02/14 1403  BP: 149/97  Pulse: 89  Temp: 98.7 F (37.1 C)  Resp: 18    General: Feeling better, AAOX3 and with improved pain control with current regimen. No CP, no fever and endorses breathing is back to baseline/if not better.  Cardiovascular: S1 and S2, no rubs or gallops; neg JVD  Respiratory: mild exp wheezing, scattered rhonchi, no crackles and with good air movement   Abdomen: soft, NT, ND, positive BS  Musculoskeletal: no edema or cyanosis, no clubbing   Discharge Instructions   Discharge Instructions    Diet - low sodium heart healthy    Complete by:  As directed      Discharge instructions    Complete by:  As directed   Take medications as prescribed Follow a heart healthy diet Stop smoking Arrange follow up with pain clinic in not more than 10 days Follow up with PCP in 2 weeks          Current Discharge Medication List    START taking these medications   Details  amoxicillin-clavulanate (AUGMENTIN) 500-125 MG per tablet Take 1 tablet (500 mg total) by mouth 3 (three) times daily. Qty: 21 tablet, Refills: 0    guaiFENesin  (MUCINEX) 600 MG 12 hr tablet Take 1 tablet (600 mg total) by mouth 2 (two) times daily. Qty: 40 tablet, Refills: 0      CONTINUE these medications which have CHANGED   Details  oxyCODONE (OXY IR/ROXICODONE) 5 MG immediate release tablet Take 1 tablet (5 mg total) by mouth every 6 (six) hours as needed for severe pain or breakthrough pain. Qty: 40 tablet, Refills: 0    OxyCODONE (OXYCONTIN) 15 mg T12A 12 hr tablet Take 1 tablet (15 mg total) by mouth every 12 (twelve) hours. Qty: 30 tablet, Refills: 0      CONTINUE these medications which have NOT CHANGED   Details  albuterol (PROAIR HFA) 108 (90 BASE) MCG/ACT inhaler 2 puffs every 4 hours as needed only  if your can't catch your breath    albuterol (PROVENTIL) (2.5 MG/3ML) 0.083% nebulizer solution Take 3 mLs (2.5 mg total) by nebulization every 6 (six) hours as needed for wheezing or shortness of breath. Qty: 75 mL, Refills: 12    amLODipine (NORVASC) 10 MG tablet Take 10 mg by mouth daily.    aspirin 81 MG tablet Take 81 mg by mouth daily.    budesonide-formoterol (SYMBICORT) 160-4.5 MCG/ACT inhaler Inhale 2 puffs into the  lungs 2 (two) times daily.    CALCIUM PO Take 1 tablet by mouth daily.    Multiple Vitamin (MULTIVITAMIN WITH MINERALS) TABS Take 1 tablet by mouth daily.    olmesartan (BENICAR) 20 MG tablet 1/2 tab by mouth daily Qty: 30 tablet, Refills: 3    POTASSIUM PO Take 1 tablet by mouth daily.    pravastatin (PRAVACHOL) 40 MG tablet Take 40 mg by mouth daily.    rOPINIRole (REQUIP) 0.25 MG tablet Take 0.5 mg by mouth daily.     tiotropium (SPIRIVA) 18 MCG inhalation capsule Place 18 mcg into inhaler and inhale daily.      STOP taking these medications     traMADol (ULTRAM) 50 MG tablet        No Known Allergies Follow-up Information    Follow up with BURNETT,BRENT A, MD. Schedule an appointment as soon as possible for a visit in 2 weeks.   Specialty:  Family Medicine   Contact information:   [address] [place] [address]       The results of significant diagnostics from this hospitalization (including imaging, microbiology, ancillary and laboratory) are listed below for reference.    Significant Diagnostic Studies: Dg Chest 2 View  06/02/2014   CLINICAL DATA:  Shortness of breath, cough, congestion  EXAM: CHEST  2 VIEW  COMPARISON:  05/31/2014  FINDINGS: Improved infectious or congestive interstitial coarsening. Interstitial opacities are now at baseline. There is chronic hyperinflation.There is no edema, consolidation, effusion, or pneumothorax.  Normal heart size and stable aortic tortuosity. Suspect a small sliding hiatal hernia.  Multilevel posterior spinal fixation, with visible portions intact. Dorsal column stimulator noted at the mid thoracic level.  IMPRESSION: 1. Improved congestive or bronchitic interstitial opacities. 2. COPD.   Electronically Signed   By: Marnee Spring M.D.   On: 06/02/2014 08:03   Ct Head Wo Contrast  05/31/2014   CLINICAL DATA:  Initial evaluation for acute altered mental status.  EXAM: CT HEAD WITHOUT CONTRAST  TECHNIQUE: Contiguous axial images were obtained from the base of the skull through the vertex without intravenous contrast.  COMPARISON:  None available.  FINDINGS: The is degraded by motion artifact.  No acute large vessel territory infarct or intracranial hemorrhage. Gray-white matter differentiation maintained.  No mass lesion or midline shift. No hydrocephalus. No extra-axial fluid collection.  Scalp soft tissues within normal limits. No acute abnormality seen about the orbits.  Paranasal sinuses and mastoid air cells are clear. Calvarium intact.  IMPRESSION: Somewhat limited study due to motion with no definite acute intracranial abnormality identified.   Electronically Signed   By: Rise Mu M.D.   On: 05/31/2014 06:27   Dg Chest Port 1 View  05/31/2014   CLINICAL DATA:  Lethargy.  Decreased O2 sats.  COPD.   EXAM: PORTABLE CHEST - 1 VIEW  COMPARISON:  Chest radiograph 03/1914.  FINDINGS: The heart is enlarged. Aortic atherosclerosis is present. The patient has undergone thoracolumbar fusion with pedicle screws and rods. Dorsal column stimulator is redemonstrated within the upper thoracic canal.  Diffuse interstitial prominence of the lungs without focal consolidation. This could represent worsening of chronic bronchitis, viral infection, atypical edema, or inhalational related canal origin. Worsening aeration compared with priors. Osseous demineralization.  IMPRESSION: Diffuse interstitial prominence of the lungs has worsened compared with priors. Correlate clinically for atypical CHF, versus inflammatory/ infectious etiology.   Electronically Signed   By: Davonna Belling M.D.   On: 05/31/2014 03:47  Microbiology: Recent Results (from the past 240 hour(s))  Blood culture (routine x 2)     Status: None (Preliminary result)   Collection Time: 05/31/14  3:10 AM  Result Value Ref Range Status   Specimen Description BLOOD RIGHT ARM  Final   Special Requests BOTTLES DRAWN AEROBIC AND ANAEROBIC 5ML EACH  Final   Culture   Final           BLOOD CULTURE RECEIVED NO GROWTH TO DATE CULTURE WILL BE HELD FOR 5 DAYS BEFORE ISSUING A FINAL NEGATIVE REPORT [facility]formed at [facility]    Report Status PENDING  Incomplete  Blood culture (routine x 2)     Status: None (Preliminary result)   Collection Time: 05/31/14  5:00 AM  Result Value Ref Range Status   Specimen Description BLOOD LEFT HAND  Final   Special Requests BOTTLES DRAWN AEROBIC AND ANAEROBIC 10CC EACH  Final   Culture   Final           BLOOD CULTURE RECEIVED NO GROWTH TO DATE CULTURE WILL BE HELD FOR 5 DAYS BEFORE ISSUING A FINAL NEGATIVE REPORT Performed at [facility]    Report Status PENDING  Incomplete  MRSA PCR Screening     Status: None   Collection Time: 05/31/14  7:04 AM  Result Value Ref Range Status   MRSA by PCR NEGATIVE  NEGATIVE Final    Comment:        The GeneXpert MRSA Assay (FDA approved for NASAL specimens only), is one component of a comprehensive MRSA colonization surveillance program. It is not intended to diagnose MRSA infection nor to guide or monitor treatment for MRSA infections.      Labs: Basic Metabolic Panel:  Recent Labs Lab 05/31/14 0215 06/01/14 0301 06/02/14 0535  NA 131* 132* 138  K 3.8 3.9 4.0  CL 92* 91* 97  CO2 28 32 31  GLUCOSE 121* 85 86  BUN 9 8 6   CREATININE 0.47* 0.49* 0.51  CALCIUM 8.6 8.8 9.0   Liver Function Tests:  Recent Labs Lab 05/31/14 0215  AST 16  ALT 15  ALKPHOS 84  BILITOT 0.7  PROT 6.7  ALBUMIN 3.5   CBC:  Recent Labs Lab 05/31/14 0215 06/01/14 0301 06/02/14 0535  WBC 23.7* 16.5* 9.8  NEUTROABS 20.7*  --   --   HGB 14.0 13.0 13.6  HCT 42.1 39.3 41.7  MCV 90.5 89.3 90.7  PLT 308 319 320   Cardiac Enzymes:  Recent Labs Lab 05/31/14 0500 05/31/14 0942 05/31/14 1445 05/31/14 2039  TROPONINI 0.05* 0.38* 0.03 0.08*   BNP: BNP (last 3 results)  Recent Labs  03/27/14 1850 05/31/14 0942  BNP 103.8* 462.1*    CBG:  Recent Labs Lab 05/31/14 0214  GLUCAP 120*    Signed:  Vassie LollMadera, Cassian Torelli  [facility] 06/02/2014, 4:05 PM

## 2014-06-02 NOTE — Progress Notes (Signed)
Discharge paperwork given and reviewed with patient. No questions verbalized. Prescriptions given to patient.

## 2014-06-06 LAB — CULTURE, BLOOD (ROUTINE X 2)
CULTURE: NO GROWTH
Culture: NO GROWTH

## 2014-06-07 DIAGNOSIS — G9340 Encephalopathy, unspecified: Secondary | ICD-10-CM

## 2014-06-07 HISTORY — DX: Encephalopathy, unspecified: G93.40

## 2014-06-07 HISTORY — PX: OTHER SURGICAL HISTORY: SHX169

## 2014-07-06 ENCOUNTER — Inpatient Hospital Stay (HOSPITAL_COMMUNITY)
Admission: EM | Admit: 2014-07-06 | Discharge: 2014-07-13 | DRG: 896 | Disposition: A | Discharge status: Home Health | Payer: Medicare Other | Attending: Pulmonary Disease | Admitting: Pulmonary Disease

## 2014-07-06 ENCOUNTER — Observation Stay (HOSPITAL_COMMUNITY): Payer: Medicare Other

## 2014-07-06 ENCOUNTER — Emergency Department (HOSPITAL_COMMUNITY): Payer: Medicare Other

## 2014-07-06 ENCOUNTER — Encounter (HOSPITAL_COMMUNITY): Payer: Self-pay | Admitting: *Deleted

## 2014-07-06 DIAGNOSIS — F1120 Opioid dependence, uncomplicated: Secondary | ICD-10-CM

## 2014-07-06 DIAGNOSIS — J449 Chronic obstructive pulmonary disease, unspecified: Secondary | ICD-10-CM | POA: Diagnosis present

## 2014-07-06 DIAGNOSIS — E785 Hyperlipidemia, unspecified: Secondary | ICD-10-CM | POA: Diagnosis present

## 2014-07-06 DIAGNOSIS — J9622 Acute and chronic respiratory failure with hypercapnia: Secondary | ICD-10-CM | POA: Diagnosis present

## 2014-07-06 DIAGNOSIS — Z720 Tobacco use: Secondary | ICD-10-CM

## 2014-07-06 DIAGNOSIS — Z79899 Other long term (current) drug therapy: Secondary | ICD-10-CM

## 2014-07-06 DIAGNOSIS — I10 Essential (primary) hypertension: Secondary | ICD-10-CM | POA: Diagnosis not present

## 2014-07-06 DIAGNOSIS — F1123 Opioid dependence with withdrawal: Secondary | ICD-10-CM | POA: Diagnosis not present

## 2014-07-06 DIAGNOSIS — R05 Cough: Secondary | ICD-10-CM

## 2014-07-06 DIAGNOSIS — E871 Hypo-osmolality and hyponatremia: Secondary | ICD-10-CM | POA: Diagnosis present

## 2014-07-06 DIAGNOSIS — R0989 Other specified symptoms and signs involving the circulatory and respiratory systems: Secondary | ICD-10-CM

## 2014-07-06 DIAGNOSIS — Z9981 Dependence on supplemental oxygen: Secondary | ICD-10-CM

## 2014-07-06 DIAGNOSIS — R519 Headache, unspecified: Secondary | ICD-10-CM

## 2014-07-06 DIAGNOSIS — R5383 Other fatigue: Secondary | ICD-10-CM | POA: Diagnosis present

## 2014-07-06 DIAGNOSIS — G894 Chronic pain syndrome: Secondary | ICD-10-CM | POA: Diagnosis present

## 2014-07-06 DIAGNOSIS — J9612 Chronic respiratory failure with hypercapnia: Secondary | ICD-10-CM | POA: Diagnosis present

## 2014-07-06 DIAGNOSIS — L8990 Pressure ulcer of unspecified site, unspecified stage: Secondary | ICD-10-CM | POA: Insufficient documentation

## 2014-07-06 DIAGNOSIS — G9340 Encephalopathy, unspecified: Secondary | ICD-10-CM | POA: Diagnosis present

## 2014-07-06 DIAGNOSIS — G9341 Metabolic encephalopathy: Secondary | ICD-10-CM | POA: Diagnosis present

## 2014-07-06 DIAGNOSIS — R400 Somnolence: Secondary | ICD-10-CM

## 2014-07-06 DIAGNOSIS — E872 Acidosis: Secondary | ICD-10-CM | POA: Diagnosis present

## 2014-07-06 DIAGNOSIS — Z01818 Encounter for other preprocedural examination: Secondary | ICD-10-CM

## 2014-07-06 DIAGNOSIS — I959 Hypotension, unspecified: Secondary | ICD-10-CM | POA: Diagnosis not present

## 2014-07-06 DIAGNOSIS — R4182 Altered mental status, unspecified: Secondary | ICD-10-CM

## 2014-07-06 DIAGNOSIS — Z981 Arthrodesis status: Secondary | ICD-10-CM

## 2014-07-06 DIAGNOSIS — R509 Fever, unspecified: Secondary | ICD-10-CM

## 2014-07-06 DIAGNOSIS — J45909 Unspecified asthma, uncomplicated: Secondary | ICD-10-CM | POA: Diagnosis present

## 2014-07-06 DIAGNOSIS — E876 Hypokalemia: Secondary | ICD-10-CM | POA: Diagnosis not present

## 2014-07-06 DIAGNOSIS — Z7982 Long term (current) use of aspirin: Secondary | ICD-10-CM

## 2014-07-06 DIAGNOSIS — G8929 Other chronic pain: Secondary | ICD-10-CM | POA: Diagnosis present

## 2014-07-06 DIAGNOSIS — F17210 Nicotine dependence, cigarettes, uncomplicated: Secondary | ICD-10-CM | POA: Diagnosis present

## 2014-07-06 DIAGNOSIS — O864 Pyrexia of unknown origin following delivery: Secondary | ICD-10-CM

## 2014-07-06 DIAGNOSIS — Z825 Family history of asthma and other chronic lower respiratory diseases: Secondary | ICD-10-CM

## 2014-07-06 DIAGNOSIS — R059 Cough, unspecified: Secondary | ICD-10-CM

## 2014-07-06 DIAGNOSIS — R51 Headache: Secondary | ICD-10-CM

## 2014-07-06 DIAGNOSIS — J9621 Acute and chronic respiratory failure with hypoxia: Secondary | ICD-10-CM | POA: Diagnosis present

## 2014-07-06 DIAGNOSIS — J9690 Respiratory failure, unspecified, unspecified whether with hypoxia or hypercapnia: Secondary | ICD-10-CM

## 2014-07-06 DIAGNOSIS — Z8709 Personal history of other diseases of the respiratory system: Secondary | ICD-10-CM

## 2014-07-06 DIAGNOSIS — R531 Weakness: Secondary | ICD-10-CM

## 2014-07-06 HISTORY — DX: Reserved for inherently not codable concepts without codable children: IMO0001

## 2014-07-06 HISTORY — DX: Encephalopathy, unspecified: G93.40

## 2014-07-06 HISTORY — DX: Urinary tract infection, site not specified: N39.0

## 2014-07-06 LAB — COMPREHENSIVE METABOLIC PANEL
ALK PHOS: 49 U/L (ref 38–126)
ALT: 18 U/L (ref 14–54)
ANION GAP: 9 (ref 5–15)
AST: 22 U/L (ref 15–41)
Albumin: 3.7 g/dL (ref 3.5–5.0)
BILIRUBIN TOTAL: 0.6 mg/dL (ref 0.3–1.2)
BUN: 7 mg/dL (ref 6–20)
CO2: 27 mmol/L (ref 22–32)
Calcium: 8.7 mg/dL — ABNORMAL LOW (ref 8.9–10.3)
Chloride: 94 mmol/L — ABNORMAL LOW (ref 101–111)
Creatinine, Ser: 0.61 mg/dL (ref 0.44–1.00)
GFR calc Af Amer: >60 mL/min (ref >60)
GFR calc non Af Amer: >60 mL/min (ref >60)
Glucose, Bld: 103 mg/dL — ABNORMAL HIGH (ref 65–99)
Potassium: 3.8 mmol/L (ref 3.5–5.1)
Sodium: 130 mmol/L — ABNORMAL LOW (ref 135–145)
Total Protein: 6.3 g/dL — ABNORMAL LOW (ref 6.5–8.1)

## 2014-07-06 LAB — I-STAT ARTERIAL BLOOD GAS, ED
Bicarbonate: 26.4 mEq/L — ABNORMAL HIGH (ref 20.0–24.0)
O2 SAT: 90 %
PCO2 ART: 52.5 mmHg — AB (ref 35.0–45.0)
PH ART: 7.316 — AB (ref 7.350–7.450)
Patient temperature: 101.1
TCO2: 28 mmol/L (ref 0–100)
pO2, Arterial: 69 mmHg — ABNORMAL LOW (ref 80.0–100.0)

## 2014-07-06 LAB — CBC WITH DIFFERENTIAL/PLATELET
Basophils Absolute: 0.1 10*3/uL (ref 0.0–0.1)
Basophils Relative: 1 % (ref 0–1)
EOS PCT: 0 % (ref 0–5)
Eosinophils Absolute: 0 10*3/uL (ref 0.0–0.7)
HCT: 40 % (ref 36.0–46.0)
Hemoglobin: 13.2 g/dL (ref 12.0–15.0)
LYMPHS PCT: 8 % — AB (ref 12–46)
Lymphs Abs: 0.9 10*3/uL (ref 0.7–4.0)
MCH: 30.3 pg (ref 26.0–34.0)
MCHC: 33 g/dL (ref 30.0–36.0)
MCV: 92 fL (ref 78.0–100.0)
MONO ABS: 1.1 10*3/uL — AB (ref 0.1–1.0)
Monocytes Relative: 9 % (ref 3–12)
NEUTROS ABS: 9.7 10*3/uL — AB (ref 1.7–7.7)
Neutrophils Relative %: 82 % — ABNORMAL HIGH (ref 43–77)
Platelets: 191 10*3/uL (ref 150–400)
RBC: 4.35 MIL/uL (ref 3.87–5.11)
RDW: 14.4 % (ref 11.5–15.5)
WBC: 11.8 10*3/uL — ABNORMAL HIGH (ref 4.0–10.5)

## 2014-07-06 LAB — SALICYLATE LEVEL: Salicylate Lvl: <4 mg/dL (ref 2.8–30.0)

## 2014-07-06 LAB — BLOOD GAS, ARTERIAL
Acid-Base Excess: 3.9 mmol/L — ABNORMAL HIGH (ref 0.0–2.0)
BICARBONATE: 29.3 meq/L — AB (ref 20.0–24.0)
Drawn by: 365271
O2 Content: 3 L/min
O2 Saturation: 93.6 %
PCO2 ART: 58.4 mmHg — AB (ref 35.0–45.0)
PH ART: 7.326 — AB (ref 7.350–7.450)
Patient temperature: 100
TCO2: 31 mmol/L (ref 0–100)
pO2, Arterial: 71.6 mmHg — ABNORMAL LOW (ref 80.0–100.0)

## 2014-07-06 LAB — URINALYSIS, ROUTINE W REFLEX MICROSCOPIC
Bilirubin Urine: NEGATIVE
Glucose, UA: NEGATIVE mg/dL
KETONES UR: 40 mg/dL — AB
Leukocytes, UA: NEGATIVE
NITRITE: NEGATIVE
PH: 7.5 (ref 5.0–8.0)
PROTEIN: 100 mg/dL — AB
SPECIFIC GRAVITY, URINE: 1.015 (ref 1.005–1.030)
Urobilinogen, UA: 1 mg/dL (ref 0.0–1.0)

## 2014-07-06 LAB — URINE MICROSCOPIC-ADD ON

## 2014-07-06 LAB — ACETAMINOPHEN LEVEL

## 2014-07-06 LAB — RAPID URINE DRUG SCREEN, HOSP PERFORMED
AMPHETAMINES: NOT DETECTED
Barbiturates: NOT DETECTED
Benzodiazepines: NOT DETECTED
Cocaine: NOT DETECTED
Opiates: NOT DETECTED
Tetrahydrocannabinol: NOT DETECTED

## 2014-07-06 LAB — ETHANOL: Alcohol, Ethyl (B): <5 mg/dL (ref <5)

## 2014-07-06 MED ORDER — IPRATROPIUM-ALBUTEROL 0.5-2.5 (3) MG/3ML IN SOLN
3.0000 mL | Freq: Four times a day (QID) | RESPIRATORY_TRACT | Status: DC
  Administered 2014-07-07 – 2014-07-10 (×14): 3 mL via RESPIRATORY_TRACT
  Filled 2014-07-06 (×13): qty 3

## 2014-07-06 MED ORDER — FUROSEMIDE 10 MG/ML IJ SOLN
20.0000 mg | Freq: Once | INTRAMUSCULAR | Status: AC
  Administered 2014-07-07: 20 mg via INTRAVENOUS
  Filled 2014-07-06: qty 2

## 2014-07-06 MED ORDER — HYDRALAZINE HCL 20 MG/ML IJ SOLN
10.0000 mg | Freq: Once | INTRAMUSCULAR | Status: AC
  Administered 2014-07-06: 10 mg via INTRAVENOUS

## 2014-07-06 MED ORDER — LORAZEPAM 2 MG/ML IJ SOLN
2.0000 mg | Freq: Once | INTRAMUSCULAR | Status: AC | PRN
  Administered 2014-07-06: 2 mg via INTRAVENOUS
  Filled 2014-07-06: qty 1

## 2014-07-06 MED ORDER — HYDRALAZINE HCL 20 MG/ML IJ SOLN
5.0000 mg | INTRAMUSCULAR | Status: DC | PRN
  Administered 2014-07-07: 5 mg via INTRAVENOUS
  Filled 2014-07-06 (×2): qty 1

## 2014-07-06 MED ORDER — ALBUTEROL SULFATE (2.5 MG/3ML) 0.083% IN NEBU: 2.5000 mg | INHALATION_SOLUTION | RESPIRATORY_TRACT | Status: DC | PRN

## 2014-07-06 MED ORDER — HEPARIN SODIUM (PORCINE) 5000 UNIT/ML IJ SOLN
5000.0000 [IU] | Freq: Three times a day (TID) | INTRAMUSCULAR | Status: DC
  Administered 2014-07-06 – 2014-07-13 (×22): 5000 [IU] via SUBCUTANEOUS
  Filled 2014-07-06 (×24): qty 1

## 2014-07-06 MED ORDER — HALOPERIDOL LACTATE 5 MG/ML IJ SOLN: 2.0000 mg | Freq: Once | INTRAMUSCULAR | Status: DC

## 2014-07-06 MED ORDER — ROPINIROLE HCL 0.5 MG PO TABS
0.5000 mg | ORAL_TABLET | Freq: Every day | ORAL | Status: DC
Start: 2014-07-06 — End: 2014-07-07
  Administered 2014-07-06: 0.5 mg via ORAL
  Filled 2014-07-06 (×2): qty 1

## 2014-07-06 MED ORDER — HALOPERIDOL LACTATE 5 MG/ML IJ SOLN
INTRAMUSCULAR | Status: AC
  Filled 2014-07-06: qty 1

## 2014-07-06 MED ORDER — ONDANSETRON HCL 4 MG PO TABS: 4.0000 mg | ORAL_TABLET | Freq: Four times a day (QID) | ORAL | Status: DC | PRN

## 2014-07-06 MED ORDER — ONDANSETRON HCL 4 MG/2ML IJ SOLN: 4.0000 mg | Freq: Four times a day (QID) | INTRAMUSCULAR | Status: DC | PRN

## 2014-07-06 MED ORDER — CEFEPIME HCL 1 G IJ SOLR
1.0000 g | Freq: Three times a day (TID) | INTRAMUSCULAR | Status: DC
  Administered 2014-07-06 – 2014-07-07 (×2): 1 g via INTRAVENOUS
  Filled 2014-07-06 (×5): qty 1

## 2014-07-06 MED ORDER — IPRATROPIUM BROMIDE 0.02 % IN SOLN: 0.5000 mg | Freq: Four times a day (QID) | RESPIRATORY_TRACT | Status: DC

## 2014-07-06 MED ORDER — IPRATROPIUM-ALBUTEROL 0.5-2.5 (3) MG/3ML IN SOLN
3.0000 mL | Freq: Once | RESPIRATORY_TRACT | Status: AC
  Administered 2014-07-06: 3 mL via RESPIRATORY_TRACT
  Filled 2014-07-06: qty 3

## 2014-07-06 MED ORDER — LORAZEPAM 2 MG/ML IJ SOLN
0.5000 mg | Freq: Once | INTRAMUSCULAR | Status: AC
  Administered 2014-07-06: 0.5 mg via INTRAVENOUS
  Filled 2014-07-06: qty 1

## 2014-07-06 MED ORDER — ACETAMINOPHEN 650 MG RE SUPP
650.0000 mg | Freq: Four times a day (QID) | RECTAL | Status: DC | PRN
  Administered 2014-07-06 – 2014-07-10 (×3): 650 mg via RECTAL
  Filled 2014-07-06 (×3): qty 1

## 2014-07-06 MED ORDER — OXYCODONE HCL 5 MG PO TABS
5.0000 mg | ORAL_TABLET | Freq: Four times a day (QID) | ORAL | Status: DC | PRN
  Administered 2014-07-06: 5 mg via ORAL
  Filled 2014-07-06: qty 1

## 2014-07-06 MED ORDER — ACETAMINOPHEN 325 MG PO TABS
650.0000 mg | ORAL_TABLET | Freq: Once | ORAL | Status: AC
  Administered 2014-07-06: 650 mg via ORAL
  Filled 2014-07-06: qty 2

## 2014-07-06 MED ORDER — BUDESONIDE-FORMOTEROL FUMARATE 160-4.5 MCG/ACT IN AERO
2.0000 | INHALATION_SPRAY | Freq: Two times a day (BID) | RESPIRATORY_TRACT | Status: DC
  Filled 2014-07-06: qty 6

## 2014-07-06 MED ORDER — TIOTROPIUM BROMIDE MONOHYDRATE 18 MCG IN CAPS
18.0000 ug | ORAL_CAPSULE | Freq: Every day | RESPIRATORY_TRACT | Status: DC
  Filled 2014-07-06: qty 5

## 2014-07-06 MED ORDER — VANCOMYCIN HCL IN DEXTROSE 1-5 GM/200ML-% IV SOLN: 1000.0000 mg | Freq: Once | INTRAVENOUS | Status: DC

## 2014-07-06 MED ORDER — ALBUTEROL SULFATE (2.5 MG/3ML) 0.083% IN NEBU: 2.5000 mg | INHALATION_SOLUTION | Freq: Four times a day (QID) | RESPIRATORY_TRACT | Status: DC

## 2014-07-06 MED ORDER — VANCOMYCIN HCL IN DEXTROSE 1-5 GM/200ML-% IV SOLN
1000.0000 mg | Freq: Two times a day (BID) | INTRAVENOUS | Status: DC
  Administered 2014-07-06 – 2014-07-08 (×4): 1000 mg via INTRAVENOUS
  Filled 2014-07-06 (×5): qty 200

## 2014-07-06 MED ORDER — SODIUM CHLORIDE 0.9 % IV SOLN
INTRAVENOUS | Status: DC
  Administered 2014-07-06: 16:00:00 via INTRAVENOUS

## 2014-07-06 MED ORDER — ACETAMINOPHEN 325 MG PO TABS
650.0000 mg | ORAL_TABLET | Freq: Four times a day (QID) | ORAL | Status: DC | PRN
  Administered 2014-07-09 – 2014-07-13 (×3): 650 mg via ORAL
  Filled 2014-07-06 (×3): qty 2

## 2014-07-06 MED ORDER — KETOROLAC TROMETHAMINE 30 MG/ML IJ SOLN
30.0000 mg | Freq: Once | INTRAMUSCULAR | Status: AC
  Administered 2014-07-06: 30 mg via INTRAVENOUS
  Filled 2014-07-06: qty 1

## 2014-07-06 MED ORDER — ALUM & MAG HYDROXIDE-SIMETH 200-200-20 MG/5ML PO SUSP
30.0000 mL | Freq: Four times a day (QID) | ORAL | Status: DC | PRN
  Administered 2014-07-12: 30 mL via ORAL
  Filled 2014-07-06: qty 30

## 2014-07-06 MED ORDER — PRAVASTATIN SODIUM 40 MG PO TABS
40.0000 mg | ORAL_TABLET | Freq: Every day | ORAL | Status: DC
  Administered 2014-07-06 – 2014-07-13 (×7): 40 mg via ORAL
  Filled 2014-07-06 (×8): qty 1

## 2014-07-06 MED ORDER — NALOXONE HCL 0.4 MG/ML IJ SOLN: 0.4000 mg | Freq: Once | INTRAMUSCULAR | Status: DC

## 2014-07-06 MED ORDER — HYDRALAZINE HCL 20 MG/ML IJ SOLN
5.0000 mg | Freq: Four times a day (QID) | INTRAMUSCULAR | Status: DC | PRN
  Administered 2014-07-06: 5 mg via INTRAVENOUS
  Filled 2014-07-06 (×3): qty 1

## 2014-07-06 MED ORDER — BISACODYL 10 MG RE SUPP: 10.0000 mg | Freq: Every day | RECTAL | Status: DC | PRN

## 2014-07-06 MED ORDER — DEXTROSE 5 % IV SOLN
1.0000 g | Freq: Once | INTRAVENOUS | Status: DC
  Filled 2014-07-06: qty 1

## 2014-07-06 MED ORDER — IRBESARTAN 150 MG PO TABS
150.0000 mg | ORAL_TABLET | Freq: Every day | ORAL | Status: DC
  Administered 2014-07-06: 150 mg via ORAL
  Filled 2014-07-06 (×2): qty 1

## 2014-07-06 MED ORDER — METOPROLOL TARTRATE 1 MG/ML IV SOLN
5.0000 mg | INTRAVENOUS | Status: AC
  Administered 2014-07-06: 5 mg via INTRAVENOUS
  Filled 2014-07-06: qty 5

## 2014-07-06 MED ORDER — AMLODIPINE BESYLATE 10 MG PO TABS
10.0000 mg | ORAL_TABLET | Freq: Every day | ORAL | Status: DC
  Administered 2014-07-06: 10 mg via ORAL
  Filled 2014-07-06: qty 1
  Filled 2014-07-06: qty 2

## 2014-07-06 MED ORDER — ADULT MULTIVITAMIN W/MINERALS CH
1.0000 | ORAL_TABLET | Freq: Every day | ORAL | Status: DC
Start: 2014-07-06 — End: 2014-07-13
  Administered 2014-07-06 – 2014-07-13 (×7): 1 via ORAL
  Filled 2014-07-06 (×8): qty 1

## 2014-07-06 MED ORDER — POLYETHYLENE GLYCOL 3350 17 G PO PACK
17.0000 g | PACK | Freq: Every day | ORAL | Status: DC | PRN
  Filled 2014-07-06: qty 1

## 2014-07-06 NOTE — H&P (Addendum)
Patient Demographics  [name], is a 64 y.o. female  MRN: [id number]   DOB - Dec 14, 1950  Admit Date - 07/06/2014  Outpatient Primary MD for the patient is Delorse Lek, MD   With History of -  Past Medical History  Diagnosis Date  . COPD (chronic obstructive pulmonary disease)   . Emphysema   . Bronchitis   . Hypertension   . Asthma   . Hyperlipidemia       Past Surgical History  Procedure Laterality Date  . Back surgery    . Abdominal hysterectomy    . Appendectomy      in for   Chief Complaint  Patient presents with  . Behavior Problem     HPI  [name]  is a 64 y.o. female, with past medical history of COPD, chronic respiratory failure on 2 L nasal cannula at baseline(noncompliant with it), still smoking, chronic back pain secondary to multiple lumbar surgeries in the past, Hypertension, hyperlipidemia, Recently discharged from [facility] secondary to acute encephalopathy secondary to aspiration pneumonia , and excessive lethargy due to narcotics , patient was brought by her husband and given worsening mental status this a.m. , husband report since recent discharge and adjustment of pain medication where her OxyContin and oxycodone has been decreased he has been in charge of giving her pain medications , medications bottles with him , reports in the morning his wife was drooling, confused, hard to arouse, in ED workup was significant for fever of 101, mild leukocytosis at 11.8, negative urinalysis, negative chest x-ray, urine drug screen was negative (even though husband confirmed he was giving her pain medicine as scheduled ), ABG significant for mild respiratory acidosis with PCO2 of 52 which is her baseline ,  husband reports patient had recent surgery before 10 days by Dr. Verlan Friends (pain medicine physician ) where she has pain in new stimulator removal as it was nonfunctioning , denies any dysuria, cough, productive sputum.    Review of  Systems    In addition to the HPI above,  febrile No Headache, No changes with Vision or hearing, No problems swallowing food or Liquids, No Chest pain, Cough or Shortness of Breath, No Abdominal pain, No Nausea or Vommitting, Bowel movements are regular, No Blood in stool or Urine, No dysuria, No new skin rashes or bruises, No new joints pains-aches,  No new weakness, tingling, numbness in any extremity, No recent weight gain or loss, No polyuria, polydypsia or polyphagia, No significant Mental Stressors. confused, more lethargic than usual  A full 10 point Review of Systems was done, except as stated above, all other Review of Systems were negative.   Social History History  Substance Use Topics  . Smoking status: Current Every Day Smoker -- 1.50 packs/day for 30 years    Types: Cigarettes  . Smokeless tobacco: Never Used     Comment: Pt stated she has not smoked since 04/07/2014.  Marland Kitchen Alcohol Use: No     Family History Family History  Problem Relation Age of Onset  . Emphysema Sister     smoked  . Heart disease Sister   . Ovarian cancer Sister      Prior to Admission medications   Medication Sig Start Date End Date Taking? Authorizing Provider  albuterol (PROAIR HFA) 108 (90 BASE) MCG/ACT inhaler 2 puffs every 4 hours as needed only  if your can't catch your breath 04/02/14  Yes Nyoka Cowden, MD  albuterol (PROVENTIL) (2.5  MG/3ML) 0.083% nebulizer solution Take 3 mLs (2.5 mg total) by nebulization every 6 (six) hours as needed for wheezing or shortness of breath. 04/02/14  Yes Nyoka Cowden, MD  amLODipine (NORVASC) 10 MG tablet Take 10 mg by mouth daily.   Yes Historical Provider, MD  aspirin 81 MG tablet Take 81 mg by mouth daily.   Yes Historical Provider, MD  budesonide-formoterol (SYMBICORT) 160-4.5 MCG/ACT inhaler Inhale 2 puffs into the lungs 2 (two) times daily.   Yes Historical Provider, MD  CALCIUM PO Take 1 tablet by mouth daily.   Yes Historical Provider,  MD  gabapentin (NEURONTIN) 300 MG capsule Take 300 mg by mouth 4 (four) times daily. 05/14/14  Yes Historical Provider, MD  Multiple Vitamin (MULTIVITAMIN WITH MINERALS) TABS Take 1 tablet by mouth daily.   Yes Historical Provider, MD  olmesartan (BENICAR) 20 MG tablet 1/2 tab by mouth daily Patient taking differently: Take 10 mg by mouth daily.  04/16/14  Yes Tammy S Parrett, NP  oxyCODONE (OXY IR/ROXICODONE) 5 MG immediate release tablet Take 1 tablet (5 mg total) by mouth every 6 (six) hours as needed for severe pain or breakthrough pain. 06/02/14  Yes Vassie Loll, MD  OxyCODONE (OXYCONTIN) 15 mg T12A 12 hr tablet Take 1 tablet (15 mg total) by mouth every 12 (twelve) hours. 06/02/14  Yes Vassie Loll, MD  POTASSIUM PO Take 1 tablet by mouth daily.   Yes Historical Provider, MD  pravastatin (PRAVACHOL) 40 MG tablet Take 40 mg by mouth daily.   Yes Historical Provider, MD  rOPINIRole (REQUIP) 0.25 MG tablet Take 0.5 mg by mouth daily.    Yes Historical Provider, MD  tiotropium (SPIRIVA) 18 MCG inhalation capsule Place 18 mcg into inhaler and inhale daily.   Yes Historical Provider, MD    No Known Allergies  Physical Exam  Vitals  Blood pressure 164/75, pulse 85, temperature 101.1 F (38.4 C), temperature source Rectal, resp. rate 20, SpO2 100 %.   1. General elderly female  lying in bed , awake, but sleepy, communicative, appropriate.  3. Not Suicidal or Homicidal, lethargic, Oriented X 3.  3. No F.N deficits, ALL C.Nerves Intact, Strength 5/5 all 4 extremities, Sensation intact all 4 extremities, Plantars down going.  4. Ears and Eyes appear Normal, Conjunctivae clear, PERRLAdry  Oral Mucosa.  5. Supple Neck, No JVD, No cervical lymphadenopathy appriciated, No Carotid Bruits.  6. Symmetrical Chest wall movement, Good air movement bilaterally, CTAB.  7. RRR, No Gallops, Rubs or Murmurs, No Parasternal Heave.  8. Positive Bowel Sounds, Abdomen Soft, No tenderness, No organomegaly  appriciated,No rebound -guarding or rigidity.  9.  No Cyanosis, Normal Skin Turgor, No Skin Rash or Bruise.midline surgical wound and lower thoracic area, wound healing nicely, no discharge or dehiscence   10. Good muscle tone,  joints appear normal , no effusions, Normal ROM.  11. No Palpable Lymph Nodes in Neck or Axillae    Data Review  CBC  Recent Labs Lab 07/06/14 0930  WBC 11.8*  HGB 13.2  HCT 40.0  PLT 191  MCV 92.0  MCH 30.3  MCHC 33.0  RDW 14.4  LYMPHSABS 0.9  MONOABS 1.1*  EOSABS 0.0  BASOSABS 0.1   ------------------------------------------------------------------------------------------------------------------  Chemistries   Recent Labs Lab 07/06/14 0930  NA 130*  K 3.8  CL 94*  CO2 27  GLUCOSE 103*  BUN 7  CREATININE 0.61  CALCIUM 8.7*  AST 22  ALT 18  ALKPHOS 49  BILITOT 0.6   ------------------------------------------------------------------------------------------------------------------  CrCl cannot be calculated (Unknown ideal weight.). ------------------------------------------------------------------------------------------------------------------ No results for input(s): TSH, T4TOTAL, T3FREE, THYROIDAB in the last 72 hours.  Invalid input(s): FREET3   Coagulation profile No results for input(s): INR, PROTIME in the last 168 hours. ------------------------------------------------------------------------------------------------------------------- No results for input(s): DDIMER in the last 72 hours. -------------------------------------------------------------------------------------------------------------------  Cardiac Enzymes No results for input(s): CKMB, TROPONINI, MYOGLOBIN in the last 168 hours.  Invalid input(s): CK ------------------------------------------------------------------------------------------------------------------ Invalid input(s):  POCBNP   ---------------------------------------------------------------------------------------------------------------  Urinalysis    Component Value Date/Time   COLORURINE YELLOW 07/06/2014 1116   APPEARANCEUR CLOUDY* 07/06/2014 1116   LABSPEC 1.015 07/06/2014 1116   PHURINE 7.5 07/06/2014 1116   GLUCOSEU NEGATIVE 07/06/2014 1116   HGBUR TRACE* 07/06/2014 1116   BILIRUBINUR NEGATIVE 07/06/2014 1116   KETONESUR 40* 07/06/2014 1116   PROTEINUR 100* 07/06/2014 1116   UROBILINOGEN 1.0 07/06/2014 1116   NITRITE NEGATIVE 07/06/2014 1116   LEUKOCYTESUR NEGATIVE 07/06/2014 1116    ----------------------------------------------------------------------------------------------------------------  Imaging results:   Dg Chest 2 View  07/06/2014   CLINICAL DATA:  Cough and chest pain for 2 weeks  EXAM: CHEST  2 VIEW  COMPARISON:  06/02/2014  FINDINGS: Postsurgical changes are noted in the thoracolumbar spine. A previously seen spinal stimulator has been removed in the interval. Cardiac shadow is stable. Mild interstitial changes are again seen stable from the prior study. No new focal infiltrate or sizable effusion is noted.  IMPRESSION: Chronic interstitial changes without acute abnormality.   Electronically Signed   By: Alcide Clever M.D.   On: 07/06/2014 09:39        Assessment & Plan  Active Problems:   Essential hypertension   Cigarette smoker   Lethargic   Chronic pain   Acute encephalopathy    Acute encephalopathy/fever  - Wide differentials, including sepsis, CO2 retention secondary to COPD, narcotics induced . - Husband is in charge of patient narcotic administration, very reliable, report patient has been at her usual dose, no recent increase , will give one dose of Narcan and evaluate mental status, hold gabapentin , OxyContin, keep on when necessary oxycodone to prevent withdrawals, as mental status significantly improved . - CO2 retention is a possibility, but appears  to be at baseline, no active wheezing, at this point no indication for BiPAP, will monitor closely . - Infectious process is a possibility, negative urinalysis, negative chest x-ray, had recent back surgery (pain neurostimulator removal) atient is febrile with positive leukocytosis , surgical wound looks clean , will pursue MRI thoracic/lumbar spine with and without contrast to evaluate for infectious process , will start on broad-spectrum antibiotics meanwhile pending workup , will check blood cultures .   chronic pain syndrome - Patient is following with Dr. Jordan Likes from pain medicine, will hold gabapentin, given improvement of her mental status, will hold long-acting OxyContin, keep on when necessary oxycodone.  Hypertension - blood pressure slightly elevated, resume home medication.  Cigarette smoker - Counseled  COPD - Continue with home medication, add when necessary nebs, no active wheezing, no indication for steroid-induced.  Chronic respiratory failure - At baseline on 2 L nasal cannula, will try to keep SaO2 less than 93% to prevent hypertrophic Narcosis  DVT Prophylaxis Heparin -    AM Labs Ordered, also please review Full Orders  Family Communication: Admission, patients condition and plan of care including tests being ordered have been discussed with the patient and husband at bedside who indicate understanding and agree with the plan and CODE STATUS icate understanding and agree with the  plan and Code Status.  Code Status Full  Likely DC to  Pending further work up.  Condition GUARDED   Time spent in minutes : 65 minutes    ELGERGAWY, DAWOOD M.D on 07/06/2014 at 1:37 PM  Between 7am to 7pm - Pager - [phone]  After 7pm go to [email or web address] - password [email or web address]  And look for the night coverage person covering me after hours  [facility] Office  [phone]

## 2014-07-06 NOTE — ED Notes (Signed)
Pt in with family stating she is acting strangely, unable to give specific examples, family concerned that patient overdosed on something, pt denies taking any medication, symptoms started last night, pt alert and oriented, no distress noted, ambulatory without distress

## 2014-07-06 NOTE — Progress Notes (Signed)
Pt was transferred to camera room and closer to nurses' station r/t confusion and trying to get out of bed.  Telemetry box was changed from 3E02 to currently 3E16.  CCMD has been called, notified, and confirmed.

## 2014-07-06 NOTE — Progress Notes (Signed)
Call received from floor RN regarding Pt in MRI. PER MRI tech Pt unable to lye flat. K.Kirby [facility] NP paged and haldol ordered. Upon my arrival to MRI Pt found lying flat in MRI holding bay. Pt grey in complexion, respirations labored and appear insufficient. HOB raised and Pt taken back to 3E15, K.Kirby paged to bedside. Haldol NOT GIVEN. Once back in room VS obtained yielding HR 125 ST, BP 198/108, RR 28. Lung sounds congested, po 2 95 on 3 LNC. Pt occasionally moaning, minimal response to pain. ABG orderd stat. NP to bedside to assess Pt. CXR ordered STAT and done. Rectal temp obtained yielding 103.2, Donnamarie PoagK. Kirby made aware and labs ordered as well as rectal tylenol given. Pt for transfer to 2H SDU for BIPAP, delay in bed availabilty. Pt placed on Bipap on 3East with RRT on unit. [facility].

## 2014-07-06 NOTE — Progress Notes (Signed)
RT Note:  Pt transferred to 2H from 3E without event.  Rt will continue to monitor.

## 2014-07-06 NOTE — Progress Notes (Signed)
PT Cancellation Note  Patient Details Name: [name] MRN: [id number] DOB: March 07, 1950   Cancelled Treatment:    Reason Eval/Treat Not Completed: Other (comment) Holding PT evaluation per request of RN as pt just got up to floor and is combative and agitated. Will follow up next available time.   Blake DivineShauna A Enrigue Hashimi 07/06/2014, 4:45 PM  Mylo RedShauna Araly Kaas, PT, DPT [phone]

## 2014-07-06 NOTE — ED Notes (Signed)
Pts husband requesting to be called when patient gets back from MRI. Yisroel Ramminganny [phone]

## 2014-07-06 NOTE — ED Notes (Signed)
The patient is unable to give a urine specimen at this time. 

## 2014-07-06 NOTE — Progress Notes (Signed)
Husband was at the bedside and aware pt is going to [phone]

## 2014-07-06 NOTE — Progress Notes (Signed)
MRI tech called and reported that pt refused to have MRI performed and was combative.

## 2014-07-06 NOTE — Progress Notes (Addendum)
Shift event: At beginning of shift, pt was going for MRI. Had difficulty lying still earlier in the day and MRI was postponed. Day shift doc ordered 2mg  Ativan IV on call to MRI and this was given around 2000 hrs. While in the MRI, rapid response was there and pt still couldn't lie still. However, she then was more sedated and her breathing was more rapid. Pt was brought back to the nsg unit. NP to bedside.  S: pt can not participate in ROS secondary to mental status. Per husband, her last dose of Oxy IR was 0700 hrs 07/06/14. He has been giving her meds lately. Pt recently underwent removal of a spinal stimulator for chronic back pain. Per husband, "it never worked anyway". Reports pt in her usual state of health until this am when she was lethargic and confused. Husband states she was trying to make coffee and couldn't figure out what to do. He brought her to the ED shortly thereafter. Per H&P, pt had become more awake to the point of conversing in the ED. Per husband, she has not really been alert and very talkative today since admission. Per chart, pt has emphysema and still smokes. On O2 at home.  O: VS reviewed. BP high. Has had Hydralazine 5mg , Metoprolol 5mg  and Hydralazine 10mg  again on my arrival to room. Pt is unresponsive to verbal or tactile stimulation. She is using accessory muscles to breathe with tachypnea. Lungs with congestion in upper lobes. O2 sat 91-92% on 2L [place]. Card: RRR, tachycardic. Skin is warm to touch. No cyanosis noted.  A/P: 1. Acute on chronic respiratory failure- questionably related to sedative medications, sepsis, spinal infection, or aspiration PNA (suspected as she has a hx). Bipap applied on floor in anticipation of transfer to SDU. ABG at baseline (hx PCO2 in the 50s). CXR showed no aspiration PNA but + pleural effusions. Hx Grade I diastolic dysfunction on last echo 4/16 with normal EF. Check CMP.  2. Fever up to 103.2 rectally. Tylenol supp. Continue IVF at 75 and watch  for overload. Strict I&O. Blood cultures done on admission. UA neg on admission and culture is pending. CXR as above. Serial lactates. May need further IVF. Will check to see how much IVF she had on admission. Continue Cefepime and Vanc for now. Try to narrow as tests results return.  3. Chronic pain-hold po meds as pt is NPO on bipap. Incisions from removal of stimulator are normal appearing. MRI spine not able to be done yet due to mental status. ? source of infection. Recheck CBC with diff.  4. Hypertension-home meds were restarted today, but unable to take po's now. Hydral IV prn BP over 160. Watch for hypotension in setting of suspected sepsis/SIRS.  5. Tachycardia-likely secondary to fever. IVF and follow.  Plan discussed with husband at bedside who agrees. Questions asked and answered. Will follow closely tonight. May need PCCM consult.  Jimmye NormanKaren Kirby-Graham, NP [facility] Update: Pt moved to 2H on bipap. NP to unit. RN reports pt's BP still high and pt is getting restless on Bipap. NP called and spoke to Dr. Craige CottaSood of PCCM/[facility]. Case discussed and Dr. Craige CottaSood will send PA to consult on pt. Will move to ICU bed and change status to ICU. Intubation anticipated.  PA from PCCM at bedside. Case discussed. Will follow. Will discuss decisions with husband after PCCM sees pt. Jimmye NormanKaren Kirby-Graham, NP [facility] Update: PCCM states there could be a component of withdrawal. UDS neg for Opiates on admission but  husband confirms she takes her meds "religiously" in the am and pm. Husband states he puts her doses in a weekly am/pm dispenser and he checked last night and meds were gone "like they are supposed to be". Denies use of benzos at home. Explained to him our thoughts of withdrawal and that plan now is to sedate her and that intubation may still be a possibility as night goes on. Precedex to be started by PCCM. Will add serum 10 drug screen since UDS was neg.  Other labs placed per PCCM and see their consult  note. LA was 0.9. WBCC up slightly. Na down to 121, on IVF. Ammonia added on. May need CT head-will see how she does on sedation. Husband dies any head trauma recently. BP trending down.  Will follow. KJKG, NP [facility] Pt will go for CT head. PCCM is taking over the care of the pt. KJKG, NP [facility]

## 2014-07-06 NOTE — ED Notes (Signed)
Pt husband states he is concerned that patient has taken too much pain medication, pt was recently admitted after an overdose and has since had medication adjusted, states this morning he did give her a 10mg  oxycodone instead of a 5mg  due to increased pain, but that was not an abnormal dose for her, reports that she was acting strangely like she did the day she overdosed, he found a blue powder on her face and is unsure what that was from, pt would not answer when asked, he did not feel like any of her prescribed medications were missing.

## 2014-07-06 NOTE — Progress Notes (Signed)
Pt went down for MRI with 3l O2 assisted with the sitter and transporter.Marland Kitchen. Donnamarie PoagK. Kirby was aware.

## 2014-07-06 NOTE — Progress Notes (Signed)
Pt came back from MRI, lethargic, labored breathing assisted by RRT Brooke. VS to chart, informed Donnamarie PoagK. Kirby (NP on call). Ordered for 1 dose of Hydralazine 10mg  IV and Tylenol suppository.  Seen and examined by Donnamarie PoagK. Kirby and ordered for stat ABG and CXR. Pt was transferred to stepdown unit for close monitoring.

## 2014-07-06 NOTE — Progress Notes (Signed)
Called to bedside per floor RN at 2045 to evaluate Pt prior to going to MRI. RN concerned about elevated BP  191/95. MRI attempted earlier, however Pt unable to lye flat for procedure. Ativan 2 mg given at 2004. Pt found resting in bed unable to follow commands, very lethargic. Withdraws to pian. RR 25-30, Per Pt spouse at bedside this is Pt's normal breathing when she sleeps. RN advised to notify [facility] of Pt current status and go to MRI if K.Kirby NP agrees. NP paged and call back received, Per NP Pt ok to go to MRI.

## 2014-07-06 NOTE — ED Provider Notes (Signed)
CSN: [id number]     Arrival date & time 07/06/14  0840 History   First MD Initiated Contact with Patient 07/06/14 0901     Chief Complaint  Patient presents with  . Behavior Problem     (Consider location/radiation/quality/duration/timing/severity/associated sxs/prior Treatment) The history is provided by the patient and medical records.    This is a 64 year old female with past medical history significant for COPD, emphysema, hypertension, asthma, hyperlipidemia, presenting to the ED for possible medication overdose. Patient was recently admitted for the same as well as pneumonia. Husband states patient is on chronic a medication, her usual regimen is 15 mg oxycodone 12 hr tab at 0700 and 1900 with 5mg  oxycodone in between for breakthrough.  He states this morning she was complaining of increased pain so he gave her a 10mg  oxycodone in place of the 5mg  tablet.  States afterward she appeared very drowsy and sluggish.  States he hides her pain medication from her and counted the tablets this morning with correct count so does not believe she has taken any extra medications.  Denies EtOH or illicit drug use.  Patient continues to complain of cough and subjective fever.  She reports she has finished her abx from prior pneumonia.  Of note, patient did have her spinal nerve stimulator taken out 2 weeks ago.  Patient is O2 dependent, supposed to wear 2L at all times but she is not always compliant with this. Patient continues to smoke, however much less than before.  Low grade temp noted on arrival.  Past Medical History  Diagnosis Date  . COPD (chronic obstructive pulmonary disease)   . Emphysema   . Bronchitis   . Hypertension   . Asthma   . Hyperlipidemia    Past Surgical History  Procedure Laterality Date  . Back surgery    . Abdominal hysterectomy    . Appendectomy     Family History  Problem Relation Age of Onset  . Emphysema Sister     smoked  . Heart disease Sister   . Ovarian  cancer Sister    History  Substance Use Topics  . Smoking status: Current Every Day Smoker -- 1.50 packs/day for 30 years    Types: Cigarettes  . Smokeless tobacco: Never Used     Comment: Pt stated she has not smoked since 04/07/2014.  Marland Kitchen. Alcohol Use: No   OB History    No data available     Review of Systems  Psychiatric/Behavioral: Positive for behavioral problems.  All other systems reviewed and are negative.     Allergies  Review of patient's allergies indicates no known allergies.  Home Medications   Prior to Admission medications   Medication Sig Start Date End Date Taking? Authorizing Provider  albuterol (PROAIR HFA) 108 (90 BASE) MCG/ACT inhaler 2 puffs every 4 hours as needed only  if your can't catch your breath 04/02/14  Yes Nyoka CowdenMichael B Wert, MD  albuterol (PROVENTIL) (2.5 MG/3ML) 0.083% nebulizer solution Take 3 mLs (2.5 mg total) by nebulization every 6 (six) hours as needed for wheezing or shortness of breath. 04/02/14  Yes Nyoka CowdenMichael B Wert, MD  amLODipine (NORVASC) 10 MG tablet Take 10 mg by mouth daily.   Yes Historical Provider, MD  aspirin 81 MG tablet Take 81 mg by mouth daily.   Yes Historical Provider, MD  budesonide-formoterol (SYMBICORT) 160-4.5 MCG/ACT inhaler Inhale 2 puffs into the lungs 2 (two) times daily.   Yes Historical Provider, MD  CALCIUM PO Take 1 tablet  by mouth daily.   Yes Historical Provider, MD  gabapentin (NEURONTIN) 300 MG capsule Take 300 mg by mouth 4 (four) times daily. 05/14/14  Yes Historical Provider, MD  Multiple Vitamin (MULTIVITAMIN WITH MINERALS) TABS Take 1 tablet by mouth daily.   Yes Historical Provider, MD  olmesartan (BENICAR) 20 MG tablet 1/2 tab by mouth daily Patient taking differently: Take 10 mg by mouth daily.  04/16/14  Yes Tammy S Parrett, NP  oxyCODONE (OXY IR/ROXICODONE) 5 MG immediate release tablet Take 1 tablet (5 mg total) by mouth every 6 (six) hours as needed for severe pain or breakthrough pain. 06/02/14  Yes Vassie Loll, MD  OxyCODONE (OXYCONTIN) 15 mg T12A 12 hr tablet Take 1 tablet (15 mg total) by mouth every 12 (twelve) hours. 06/02/14  Yes Vassie Loll, MD  POTASSIUM PO Take 1 tablet by mouth daily.   Yes Historical Provider, MD  pravastatin (PRAVACHOL) 40 MG tablet Take 40 mg by mouth daily.   Yes Historical Provider, MD  rOPINIRole (REQUIP) 0.25 MG tablet Take 0.5 mg by mouth daily.    Yes Historical Provider, MD  tiotropium (SPIRIVA) 18 MCG inhalation capsule Place 18 mcg into inhaler and inhale daily.   Yes Historical Provider, MD   BP 196/92 mmHg  Pulse 96  Temp(Src) 99.6 F (37.6 C) (Oral)  Resp 20  SpO2 93%   Physical Exam  Constitutional: She is oriented to person, place, and time. She appears well-developed and well-nourished.  Moaning and requesting pain medication  HENT:  Head: Normocephalic and atraumatic.  Mouth/Throat: Oropharynx is clear and moist.  Eyes: Conjunctivae and EOM are normal. Pupils are equal, round, and reactive to light.  Neck: Normal range of motion and full passive range of motion without pain. No rigidity.  No meningismus  Cardiovascular: Normal rate, regular rhythm and normal heart sounds.   Pulmonary/Chest: Effort normal. She has wheezes.  Diffuse wheezes, 2L O2 via [place]  Abdominal: Soft. Bowel sounds are normal.  Musculoskeletal: Normal range of motion.  Neurological: She is alert and oriented to person, place, and time.  AAOx3, appears drowsy but is able to follow commands and answer questions appropriately, moving extremities without ataxia  Skin: Skin is warm and dry.  Psychiatric: She has a normal mood and affect.  Nursing note and vitals reviewed.   ED Course  Procedures (including critical care time) Labs Review Labs Reviewed  CBC WITH DIFFERENTIAL/PLATELET - Abnormal; Notable for the following:    WBC 11.8 (*)    Neutrophils Relative % 82 (*)    Neutro Abs 9.7 (*)    Lymphocytes Relative 8 (*)    Monocytes Absolute 1.1 (*)    All other  components within normal limits  COMPREHENSIVE METABOLIC PANEL - Abnormal; Notable for the following:    Sodium 130 (*)    Chloride 94 (*)    Glucose, Bld 103 (*)    Calcium 8.7 (*)    Total Protein 6.3 (*)    All other components within normal limits  ACETAMINOPHEN LEVEL - Abnormal; Notable for the following:    Acetaminophen (Tylenol), Serum <10 (*)    All other components within normal limits  URINALYSIS, ROUTINE W REFLEX MICROSCOPIC (NOT AT [facility]) - Abnormal; Notable for the following:    APPearance CLOUDY (*)    Hgb urine dipstick TRACE (*)    Ketones, ur 40 (*)    Protein, ur 100 (*)    All other components within normal limits  URINE MICROSCOPIC-ADD ON -  Abnormal; Notable for the following:    Bacteria, UA FEW (*)    All other components within normal limits  I-STAT ARTERIAL BLOOD GAS, ED - Abnormal; Notable for the following:    pH, Arterial 7.316 (*)    pCO2 arterial 52.5 (*)    pO2, Arterial 69.0 (*)    Bicarbonate 26.4 (*)    All other components within normal limits  CULTURE, BLOOD (ROUTINE X 2)  CULTURE, BLOOD (ROUTINE X 2)  URINE CULTURE  ETHANOL  SALICYLATE LEVEL  URINE RAPID DRUG SCREEN (HOSP PERFORMED) NOT AT [facility]    Imaging Review Dg Chest 2 View  07/06/2014   CLINICAL DATA:  Cough and chest pain for 2 weeks  EXAM: CHEST  2 VIEW  COMPARISON:  06/02/2014  FINDINGS: Postsurgical changes are noted in the thoracolumbar spine. A previously seen spinal stimulator has been removed in the interval. Cardiac shadow is stable. Mild interstitial changes are again seen stable from the prior study. No new focal infiltrate or sizable effusion is noted.  IMPRESSION: Chronic interstitial changes without acute abnormality.   Electronically Signed   By: Alcide Clever M.D.   On: 07/06/2014 09:39     EKG Interpretation None      MDM   Final diagnoses:  Cough  Fever, unspecified fever cause  Drowsiness  Narcotic dependence   64 year old female here with questionable  change in mental status from possible medication overdose. Patient was admitted for accidental opiate overdose last month, patient has been states she was acting similarly to today. She was also diagnosed with pneumonia at that time, reports she finished her course of antibiotics. Patient has a low-grade fever but is not tachycardic. She had initial sat of 87%, however was placed on her normal 2 L of O2, sats improved to 100%.  She does appear somewhat drowsy but is not lethargic. She is alert and oriented 3 and is able to answer questions and follow commands appropriately. She has no focal neurologic deficits currently.  Repeat CXR today without evidence of pneumonia.  Blood gas is reassuring.  Lab work improved from previous-- white count now 11.8 (down from 23).  UDS is negative for opiates today.  Patient does have low-grade fever and slight change in her mental status. Meningitis was considered, however I feel this is unlikely. Of note, patient recently had her spinal stimulator removed 2 weeks ago at [facility]-- this is also a possible source of infection.  Case was discussed with hospitalist group who will admit for further management.  MRI of T/L spine has been ordered.  Patient was given small dose of ativan as she could not lie still for imaging initially.  Garlon Hatchet, PA-C 07/06/14 1531  Pricilla Loveless, MD 07/06/14 [phone]

## 2014-07-06 NOTE — Consult Note (Signed)
ANTIBIOTIC CONSULT NOTE - INITIAL  Pharmacy Consult for Vancomycin and Cefepime Indication: fever  No Known Allergies  Patient Measurements: Weight: 163 lb 12.8 oz (74.3 kg)   Vital Signs: Temp: 99.1 F (37.3 C) (05/30 1552) Temp Source: Oral (05/30 1552) BP: 178/74 mmHg (05/30 1552) Pulse Rate: 88 (05/30 1552) Intake/Output from previous day:   Intake/Output from this shift:    Labs:  Recent Labs  07/06/14 0930  WBC 11.8*  HGB 13.2  PLT 191  CREATININE 0.61   Estimated Creatinine Clearance: 73.8 mL/min (by C-G formula based on Cr of 0.61).  Microbiology: No results found for this or any previous visit (from the past 720 hour(s)).  Medical History: Past Medical History  Diagnosis Date  . COPD (chronic obstructive pulmonary disease)   . Emphysema   . Bronchitis   . Hypertension   . Asthma   . Hyperlipidemia    Assessment: 63yof presented to the ED with fever and altered mental status. She was recently admitted for CAP/aspiration pneumonia (4/24-4/26). CXR this admission is negative. She did have recent back surgery (pain neurostimulator removal) and will begin vancomycin and cefepime to cover for possible infection. Renal function is stable.  Goal of Therapy:  Vancomycin trough level 15-20 mcg/ml  Plan:  1) Vancomycin 1g IV q12 2) Cefepime 1g IV q8 3) Follow renal function, cultures, LOT, level if needed  Daisy RiggerMarkle, [name] Sue 07/06/2014,4:39 PM

## 2014-07-07 ENCOUNTER — Observation Stay (HOSPITAL_COMMUNITY): Payer: Medicare Other

## 2014-07-07 ENCOUNTER — Inpatient Hospital Stay (HOSPITAL_COMMUNITY): Payer: Medicare Other

## 2014-07-07 DIAGNOSIS — J9622 Acute and chronic respiratory failure with hypercapnia: Secondary | ICD-10-CM | POA: Diagnosis present

## 2014-07-07 DIAGNOSIS — J431 Panlobular emphysema: Secondary | ICD-10-CM

## 2014-07-07 DIAGNOSIS — E876 Hypokalemia: Secondary | ICD-10-CM | POA: Diagnosis not present

## 2014-07-07 DIAGNOSIS — E871 Hypo-osmolality and hyponatremia: Secondary | ICD-10-CM | POA: Diagnosis present

## 2014-07-07 DIAGNOSIS — Z7982 Long term (current) use of aspirin: Secondary | ICD-10-CM | POA: Diagnosis not present

## 2014-07-07 DIAGNOSIS — Z825 Family history of asthma and other chronic lower respiratory diseases: Secondary | ICD-10-CM | POA: Diagnosis not present

## 2014-07-07 DIAGNOSIS — G9340 Encephalopathy, unspecified: Secondary | ICD-10-CM

## 2014-07-07 DIAGNOSIS — R4020 Unspecified coma: Secondary | ICD-10-CM | POA: Diagnosis not present

## 2014-07-07 DIAGNOSIS — F1123 Opioid dependence with withdrawal: Secondary | ICD-10-CM | POA: Diagnosis present

## 2014-07-07 DIAGNOSIS — Z981 Arthrodesis status: Secondary | ICD-10-CM | POA: Diagnosis not present

## 2014-07-07 DIAGNOSIS — R05 Cough: Secondary | ICD-10-CM | POA: Diagnosis present

## 2014-07-07 DIAGNOSIS — R400 Somnolence: Secondary | ICD-10-CM | POA: Diagnosis not present

## 2014-07-07 DIAGNOSIS — J449 Chronic obstructive pulmonary disease, unspecified: Secondary | ICD-10-CM | POA: Insufficient documentation

## 2014-07-07 DIAGNOSIS — I10 Essential (primary) hypertension: Secondary | ICD-10-CM | POA: Diagnosis present

## 2014-07-07 DIAGNOSIS — J45909 Unspecified asthma, uncomplicated: Secondary | ICD-10-CM | POA: Diagnosis present

## 2014-07-07 DIAGNOSIS — G8929 Other chronic pain: Secondary | ICD-10-CM | POA: Diagnosis not present

## 2014-07-07 DIAGNOSIS — R401 Stupor: Secondary | ICD-10-CM | POA: Diagnosis not present

## 2014-07-07 DIAGNOSIS — F17210 Nicotine dependence, cigarettes, uncomplicated: Secondary | ICD-10-CM | POA: Diagnosis present

## 2014-07-07 DIAGNOSIS — E785 Hyperlipidemia, unspecified: Secondary | ICD-10-CM | POA: Diagnosis present

## 2014-07-07 DIAGNOSIS — Z720 Tobacco use: Secondary | ICD-10-CM | POA: Diagnosis not present

## 2014-07-07 DIAGNOSIS — G9341 Metabolic encephalopathy: Secondary | ICD-10-CM | POA: Diagnosis present

## 2014-07-07 DIAGNOSIS — E872 Acidosis: Secondary | ICD-10-CM | POA: Diagnosis present

## 2014-07-07 DIAGNOSIS — J432 Centrilobular emphysema: Secondary | ICD-10-CM | POA: Diagnosis not present

## 2014-07-07 DIAGNOSIS — G894 Chronic pain syndrome: Secondary | ICD-10-CM | POA: Diagnosis present

## 2014-07-07 DIAGNOSIS — Z9981 Dependence on supplemental oxygen: Secondary | ICD-10-CM | POA: Diagnosis not present

## 2014-07-07 DIAGNOSIS — Z79899 Other long term (current) drug therapy: Secondary | ICD-10-CM | POA: Diagnosis not present

## 2014-07-07 DIAGNOSIS — J9621 Acute and chronic respiratory failure with hypoxia: Secondary | ICD-10-CM | POA: Diagnosis present

## 2014-07-07 DIAGNOSIS — I959 Hypotension, unspecified: Secondary | ICD-10-CM | POA: Diagnosis not present

## 2014-07-07 DIAGNOSIS — L8990 Pressure ulcer of unspecified site, unspecified stage: Secondary | ICD-10-CM | POA: Insufficient documentation

## 2014-07-07 DIAGNOSIS — R4182 Altered mental status, unspecified: Secondary | ICD-10-CM | POA: Insufficient documentation

## 2014-07-07 DIAGNOSIS — R0989 Other specified symptoms and signs involving the circulatory and respiratory systems: Secondary | ICD-10-CM | POA: Diagnosis not present

## 2014-07-07 DIAGNOSIS — R509 Fever, unspecified: Secondary | ICD-10-CM

## 2014-07-07 DIAGNOSIS — R40241 Glasgow coma scale score 13-15: Secondary | ICD-10-CM | POA: Diagnosis not present

## 2014-07-07 LAB — CBC WITH DIFFERENTIAL/PLATELET
BASOS PCT: 0 % (ref 0–1)
Basophils Absolute: 0 10*3/uL (ref 0.0–0.1)
Eosinophils Absolute: 0 10*3/uL (ref 0.0–0.7)
Eosinophils Relative: 0 % (ref 0–5)
HCT: 45.4 % (ref 36.0–46.0)
Hemoglobin: 15.2 g/dL — ABNORMAL HIGH (ref 12.0–15.0)
LYMPHS PCT: 3 % — AB (ref 12–46)
Lymphs Abs: 0.4 10*3/uL — ABNORMAL LOW (ref 0.7–4.0)
MCH: 29.9 pg (ref 26.0–34.0)
MCHC: 33.5 g/dL (ref 30.0–36.0)
MCV: 89.4 fL (ref 78.0–100.0)
MONOS PCT: 9 % (ref 3–12)
Monocytes Absolute: 1.4 10*3/uL — ABNORMAL HIGH (ref 0.1–1.0)
NEUTROS PCT: 88 % — AB (ref 43–77)
Neutro Abs: 13 10*3/uL — ABNORMAL HIGH (ref 1.7–7.7)
Platelets: 175 10*3/uL (ref 150–400)
RBC: 5.08 MIL/uL (ref 3.87–5.11)
RDW: 13.6 % (ref 11.5–15.5)
WBC: 14.8 10*3/uL — ABNORMAL HIGH (ref 4.0–10.5)

## 2014-07-07 LAB — GLUCOSE, CAPILLARY
Glucose-Capillary: 101 mg/dL — ABNORMAL HIGH (ref 65–99)
Glucose-Capillary: 89 mg/dL (ref 65–99)
Glucose-Capillary: 81 mg/dL (ref 65–99)
Glucose-Capillary: 90 mg/dL (ref 65–99)

## 2014-07-07 LAB — POCT I-STAT 3, ART BLOOD GAS (G3+)
Acid-base deficit: 4 mmol/L — ABNORMAL HIGH (ref 0.0–2.0)
Bicarbonate: 25.5 mEq/L — ABNORMAL HIGH (ref 20.0–24.0)
O2 Saturation: 92 %
PCO2 ART: 62.2 mmHg — AB (ref 35.0–45.0)
TCO2: 27 mmol/L (ref 0–100)
pH, Arterial: 7.22 — ABNORMAL LOW (ref 7.350–7.450)
pO2, Arterial: 78 mmHg — ABNORMAL LOW (ref 80.0–100.0)

## 2014-07-07 LAB — CBC
HCT: 44.6 % (ref 36.0–46.0)
Hemoglobin: 14.9 g/dL (ref 12.0–15.0)
MCH: 30.4 pg (ref 26.0–34.0)
MCHC: 33.4 g/dL (ref 30.0–36.0)
MCV: 91 fL (ref 78.0–100.0)
Platelets: 190 10*3/uL (ref 150–400)
RBC: 4.9 MIL/uL (ref 3.87–5.11)
RDW: 13.7 % (ref 11.5–15.5)
WBC: 15 10*3/uL — AB (ref 4.0–10.5)

## 2014-07-07 LAB — PROTIME-INR
INR: 1.2 (ref 0.00–1.49)
Prothrombin Time: 15.3 seconds — ABNORMAL HIGH (ref 11.6–15.2)

## 2014-07-07 LAB — COMPREHENSIVE METABOLIC PANEL
ALBUMIN: 3.8 g/dL (ref 3.5–5.0)
ALK PHOS: 56 U/L (ref 38–126)
ALT: 19 U/L (ref 14–54)
ALT: 21 U/L (ref 14–54)
AST: 33 U/L (ref 15–41)
AST: 31 U/L (ref 15–41)
Albumin: 3.6 g/dL (ref 3.5–5.0)
Alkaline Phosphatase: 54 U/L (ref 38–126)
Anion gap: 11 (ref 5–15)
Anion gap: 12 (ref 5–15)
BILIRUBIN TOTAL: 0.6 mg/dL (ref 0.3–1.2)
BUN: 6 mg/dL (ref 6–20)
CHLORIDE: 88 mmol/L — AB (ref 101–111)
CO2: 25 mmol/L (ref 22–32)
CO2: 27 mmol/L (ref 22–32)
CREATININE: 0.53 mg/dL (ref 0.44–1.00)
CREATININE: 0.48 mg/dL (ref 0.44–1.00)
Calcium: 8.5 mg/dL — ABNORMAL LOW (ref 8.9–10.3)
Calcium: 8.8 mg/dL — ABNORMAL LOW (ref 8.9–10.3)
Chloride: 85 mmol/L — ABNORMAL LOW (ref 101–111)
GFR calc Af Amer: >60 mL/min (ref >60)
GFR calc non Af Amer: >60 mL/min (ref >60)
GFR calc non Af Amer: >60 mL/min (ref >60)
GLUCOSE: 120 mg/dL — AB (ref 65–99)
Glucose, Bld: 129 mg/dL — ABNORMAL HIGH (ref 65–99)
POTASSIUM: 3.1 mmol/L — AB (ref 3.5–5.1)
Potassium: 3.6 mmol/L (ref 3.5–5.1)
Sodium: 121 mmol/L — ABNORMAL LOW (ref 135–145)
Sodium: 127 mmol/L — ABNORMAL LOW (ref 135–145)
TOTAL PROTEIN: 6.7 g/dL (ref 6.5–8.1)
Total Bilirubin: 1 mg/dL (ref 0.3–1.2)
Total Protein: 6.5 g/dL (ref 6.5–8.1)

## 2014-07-07 LAB — BASIC METABOLIC PANEL
Anion gap: 12 (ref 5–15)
BUN: 6 mg/dL (ref 6–20)
CHLORIDE: 88 mmol/L — AB (ref 101–111)
CO2: 26 mmol/L (ref 22–32)
CREATININE: 0.72 mg/dL (ref 0.44–1.00)
Calcium: 8.1 mg/dL — ABNORMAL LOW (ref 8.9–10.3)
GFR calc Af Amer: >60 mL/min (ref >60)
GLUCOSE: 127 mg/dL — AB (ref 65–99)
Potassium: 3.3 mmol/L — ABNORMAL LOW (ref 3.5–5.1)
Sodium: 126 mmol/L — ABNORMAL LOW (ref 135–145)

## 2014-07-07 LAB — GRAM STAIN

## 2014-07-07 LAB — CSF CELL COUNT WITH DIFFERENTIAL
EOS CSF: 0 % (ref 0–1)
RBC Count, CSF: 6550 /mm3 — ABNORMAL HIGH
Tube #: 1
WBC, CSF: 4 /mm3 (ref 0–5)

## 2014-07-07 LAB — TSH: TSH: 1.158 u[IU]/mL (ref 0.350–4.500)

## 2014-07-07 LAB — PROTEIN AND GLUCOSE, CSF
Glucose, CSF: 69 mg/dL (ref 40–70)
TOTAL PROTEIN, CSF: 20 mg/dL (ref 15–45)

## 2014-07-07 LAB — URINE CULTURE
Colony Count: NO GROWTH
Culture: NO GROWTH

## 2014-07-07 LAB — CORTISOL: Cortisol, Plasma: 36.3 ug/dL

## 2014-07-07 LAB — FOLATE: Folate: 25.7 ng/mL (ref >5.9)

## 2014-07-07 LAB — OSMOLALITY, URINE: Osmolality, Ur: 315 mOsm/kg — ABNORMAL LOW (ref 390–1090)

## 2014-07-07 LAB — OSMOLALITY: Osmolality: 256 mOsm/kg — ABNORMAL LOW (ref 275–300)

## 2014-07-07 LAB — HEMOGLOBIN A1C
Hgb A1c MFr Bld: 5.8 % — ABNORMAL HIGH (ref 4.8–5.6)
Mean Plasma Glucose: 120 mg/dL

## 2014-07-07 LAB — AMMONIA: Ammonia: 26 umol/L (ref 9–35)

## 2014-07-07 LAB — LACTIC ACID, PLASMA
Lactic Acid, Venous: 0.8 mmol/L (ref 0.5–2.0)
Lactic Acid, Venous: 0.9 mmol/L (ref 0.5–2.0)

## 2014-07-07 LAB — TRIGLYCERIDES: Triglycerides: 99 mg/dL (ref <150)

## 2014-07-07 LAB — PROCALCITONIN: Procalcitonin: 0.35 ng/mL

## 2014-07-07 LAB — MRSA PCR SCREENING: MRSA by PCR: NEGATIVE

## 2014-07-07 LAB — APTT: aPTT: 33 seconds (ref 24–37)

## 2014-07-07 LAB — VITAMIN B12: VITAMIN B 12: 423 pg/mL (ref 180–914)

## 2014-07-07 MED ORDER — INSULIN ASPART 100 UNIT/ML ~~LOC~~ SOLN
0.0000 [IU] | SUBCUTANEOUS | Status: DC
  Administered 2014-07-08 – 2014-07-09 (×5): 1 [IU] via SUBCUTANEOUS

## 2014-07-07 MED ORDER — LIDOCAINE HCL (CARDIAC) 20 MG/ML IV SOLN
INTRAVENOUS | Status: AC
  Filled 2014-07-07: qty 5

## 2014-07-07 MED ORDER — SODIUM CHLORIDE 0.9 % IV BOLUS (SEPSIS)
1000.0000 mL | Freq: Once | INTRAVENOUS | Status: AC
  Administered 2014-07-07: 1000 mL via INTRAVENOUS

## 2014-07-07 MED ORDER — CETYLPYRIDINIUM CHLORIDE 0.05 % MT LIQD: 7.0000 mL | Freq: Two times a day (BID) | OROMUCOSAL | Status: DC

## 2014-07-07 MED ORDER — SODIUM CHLORIDE 0.9 % IJ SOLN
10.0000 mL | Freq: Two times a day (BID) | INTRAMUSCULAR | Status: DC
  Administered 2014-07-07 – 2014-07-12 (×11): 10 mL

## 2014-07-07 MED ORDER — BUDESONIDE 0.5 MG/2ML IN SUSP
0.5000 mg | Freq: Two times a day (BID) | RESPIRATORY_TRACT | Status: DC
  Administered 2014-07-07 – 2014-07-12 (×11): 0.5 mg via RESPIRATORY_TRACT
  Filled 2014-07-07 (×9): qty 2
  Filled 2014-07-07: qty 4
  Filled 2014-07-07 (×7): qty 2

## 2014-07-07 MED ORDER — ETOMIDATE 2 MG/ML IV SOLN
INTRAVENOUS | Status: AC
  Administered 2014-07-07: 15 mg
  Filled 2014-07-07: qty 20

## 2014-07-07 MED ORDER — DEXTROSE 5 % IV SOLN
1.0000 g | Freq: Once | INTRAVENOUS | Status: AC
  Administered 2014-07-07: 1 g via INTRAVENOUS
  Filled 2014-07-07: qty 1

## 2014-07-07 MED ORDER — MIDAZOLAM HCL 2 MG/2ML IJ SOLN
INTRAMUSCULAR | Status: AC
  Administered 2014-07-07: 2 mg
  Filled 2014-07-07: qty 4

## 2014-07-07 MED ORDER — CETYLPYRIDINIUM CHLORIDE 0.05 % MT LIQD
7.0000 mL | Freq: Four times a day (QID) | OROMUCOSAL | Status: DC
  Administered 2014-07-07 – 2014-07-10 (×11): 7 mL via OROMUCOSAL

## 2014-07-07 MED ORDER — HYDRALAZINE HCL 20 MG/ML IJ SOLN
10.0000 mg | Freq: Once | INTRAMUSCULAR | Status: AC
  Administered 2014-07-07: 10 mg via INTRAVENOUS

## 2014-07-07 MED ORDER — DEXMEDETOMIDINE HCL IN NACL 400 MCG/100ML IV SOLN
0.4000 ug/kg/h | INTRAVENOUS | Status: DC
  Administered 2014-07-07: 0.5 ug/kg/h via INTRAVENOUS
  Filled 2014-07-07: qty 50

## 2014-07-07 MED ORDER — FENTANYL CITRATE (PF) 100 MCG/2ML IJ SOLN
100.0000 ug | Freq: Once | INTRAMUSCULAR | Status: AC
  Administered 2014-07-07: 100 ug via INTRAVENOUS

## 2014-07-07 MED ORDER — SUCCINYLCHOLINE CHLORIDE 20 MG/ML IJ SOLN
INTRAMUSCULAR | Status: AC
  Filled 2014-07-07: qty 1

## 2014-07-07 MED ORDER — ROCURONIUM BROMIDE 50 MG/5ML IV SOLN
INTRAVENOUS | Status: AC
  Filled 2014-07-07: qty 2

## 2014-07-07 MED ORDER — CHLORHEXIDINE GLUCONATE 0.12 % MT SOLN
15.0000 mL | Freq: Two times a day (BID) | OROMUCOSAL | Status: DC
  Administered 2014-07-07: 15 mL via OROMUCOSAL
  Filled 2014-07-07: qty 15

## 2014-07-07 MED ORDER — PANTOPRAZOLE SODIUM 40 MG IV SOLR
40.0000 mg | INTRAVENOUS | Status: DC
  Administered 2014-07-07 – 2014-07-11 (×5): 40 mg via INTRAVENOUS
  Filled 2014-07-07 (×5): qty 40

## 2014-07-07 MED ORDER — CEFEPIME HCL 2 G IJ SOLR
2.0000 g | Freq: Three times a day (TID) | INTRAMUSCULAR | Status: DC
  Administered 2014-07-07 – 2014-07-09 (×6): 2 g via INTRAVENOUS
  Filled 2014-07-07 (×8): qty 2

## 2014-07-07 MED ORDER — ARFORMOTEROL TARTRATE 15 MCG/2ML IN NEBU
15.0000 ug | INHALATION_SOLUTION | Freq: Two times a day (BID) | RESPIRATORY_TRACT | Status: DC
  Administered 2014-07-07 – 2014-07-12 (×9): 15 ug via RESPIRATORY_TRACT
  Filled 2014-07-07 (×18): qty 2

## 2014-07-07 MED ORDER — FENTANYL CITRATE (PF) 100 MCG/2ML IJ SOLN
INTRAMUSCULAR | Status: AC
  Administered 2014-07-07: 100 ug
  Filled 2014-07-07: qty 2

## 2014-07-07 MED ORDER — DEXMEDETOMIDINE HCL IN NACL 200 MCG/50ML IV SOLN
0.4000 ug/kg/h | INTRAVENOUS | Status: DC
  Administered 2014-07-07: 0.2 ug/kg/h via INTRAVENOUS
  Administered 2014-07-07: 0.4 ug/kg/h via INTRAVENOUS
  Filled 2014-07-07: qty 50

## 2014-07-07 MED ORDER — ETOMIDATE 2 MG/ML IV SOLN
0.3000 mg/kg | Freq: Once | INTRAVENOUS | Status: AC
  Administered 2014-07-07: 15 mg via INTRAVENOUS

## 2014-07-07 MED ORDER — CHLORHEXIDINE GLUCONATE 0.12 % MT SOLN
15.0000 mL | Freq: Two times a day (BID) | OROMUCOSAL | Status: DC
  Administered 2014-07-07 – 2014-07-10 (×6): 15 mL via OROMUCOSAL
  Filled 2014-07-07 (×6): qty 15

## 2014-07-07 MED ORDER — GADOBENATE DIMEGLUMINE 529 MG/ML IV SOLN
15.0000 mL | Freq: Once | INTRAVENOUS | Status: AC | PRN
  Administered 2014-07-07: 15 mL via INTRAVENOUS

## 2014-07-07 MED ORDER — METRONIDAZOLE IN NACL 5-0.79 MG/ML-% IV SOLN
500.0000 mg | Freq: Four times a day (QID) | INTRAVENOUS | Status: DC
  Administered 2014-07-07 – 2014-07-10 (×12): 500 mg via INTRAVENOUS
  Filled 2014-07-07 (×14): qty 100

## 2014-07-07 MED ORDER — PROPOFOL 1000 MG/100ML IV EMUL
INTRAVENOUS | Status: AC
  Filled 2014-07-07: qty 100

## 2014-07-07 MED ORDER — FUROSEMIDE 10 MG/ML IJ SOLN
40.0000 mg | Freq: Two times a day (BID) | INTRAMUSCULAR | Status: DC
  Administered 2014-07-07: 40 mg via INTRAVENOUS
  Filled 2014-07-07 (×3): qty 4

## 2014-07-07 MED ORDER — SODIUM CHLORIDE 0.9 % IV BOLUS (SEPSIS)
500.0000 mL | Freq: Once | INTRAVENOUS | Status: AC
  Administered 2014-07-07: 500 mL via INTRAVENOUS

## 2014-07-07 MED ORDER — SODIUM BICARBONATE 8.4 % IV SOLN
INTRAVENOUS | Status: AC
  Filled 2014-07-07: qty 50

## 2014-07-07 MED ORDER — DEXTROSE 5 % IV SOLN
750.0000 mg | Freq: Three times a day (TID) | INTRAVENOUS | Status: DC
  Administered 2014-07-07 – 2014-07-09 (×5): 750 mg via INTRAVENOUS
  Filled 2014-07-07 (×7): qty 15

## 2014-07-07 MED ORDER — FENTANYL CITRATE (PF) 100 MCG/2ML IJ SOLN
INTRAMUSCULAR | Status: AC
  Administered 2014-07-07: 100 ug
  Filled 2014-07-07: qty 4

## 2014-07-07 MED ORDER — PROPOFOL 1000 MG/100ML IV EMUL
0.0000 ug/kg/min | INTRAVENOUS | Status: DC
  Administered 2014-07-07: 10 ug/kg/min via INTRAVENOUS
  Filled 2014-07-07 (×2): qty 100

## 2014-07-07 MED ORDER — DEXTROSE 5 % IV SOLN
2.0000 g | Freq: Three times a day (TID) | INTRAVENOUS | Status: DC
  Filled 2014-07-07: qty 2

## 2014-07-07 MED ORDER — POTASSIUM CHLORIDE 10 MEQ/100ML IV SOLN
10.0000 meq | INTRAVENOUS | Status: AC
  Administered 2014-07-07 (×4): 10 meq via INTRAVENOUS
  Filled 2014-07-07 (×4): qty 100

## 2014-07-07 MED ORDER — SODIUM CHLORIDE 0.9 % IJ SOLN: 10.0000 mL | INTRAMUSCULAR | Status: DC | PRN

## 2014-07-07 MED ORDER — LABETALOL HCL 5 MG/ML IV SOLN
10.0000 mg | INTRAVENOUS | Status: DC | PRN
  Administered 2014-07-07: 10 mg via INTRAVENOUS
  Filled 2014-07-07: qty 4

## 2014-07-07 MED ORDER — FENTANYL CITRATE (PF) 100 MCG/2ML IJ SOLN
INTRAMUSCULAR | Status: AC
  Filled 2014-07-07: qty 2

## 2014-07-07 MED ORDER — MIDAZOLAM HCL 2 MG/2ML IJ SOLN
4.0000 mg | Freq: Once | INTRAMUSCULAR | Status: AC
  Administered 2014-07-07: 4 mg via INTRAVENOUS

## 2014-07-07 MED ORDER — LORAZEPAM 2 MG/ML IJ SOLN
1.0000 mg | INTRAMUSCULAR | Status: DC | PRN
  Administered 2014-07-07: 2 mg via INTRAVENOUS
  Filled 2014-07-07: qty 1

## 2014-07-07 MED ORDER — FENTANYL CITRATE (PF) 100 MCG/2ML IJ SOLN
200.0000 ug | Freq: Once | INTRAMUSCULAR | Status: AC
  Administered 2014-07-07: 200 ug via INTRAVENOUS

## 2014-07-07 MED ORDER — PROPOFOL 1000 MG/100ML IV EMUL
INTRAVENOUS | Status: AC
Start: 2014-07-07 — End: 2014-07-07
  Filled 2014-07-07: qty 100

## 2014-07-07 NOTE — Progress Notes (Signed)
PT Cancellation Note  Patient Details Name: Daisy FolksRebecca E Pham MRN: [id number] DOB: 11-26-1950   Cancelled Treatment:    Reason Eval/Treat Not Completed: Medical issues which prohibited therapy (pt with respiratory distress on Bipap, not following commands, restrained and potential intubation later. Will hold until medically appropriate)   [name], [name] 07/07/2014, 7:46 AM [name], PT [phone]

## 2014-07-07 NOTE — Progress Notes (Signed)
ANTIBIOTIC CONSULT NOTE - INITIAL  Pharmacy Consult for cefepime, vancomycin Indication: meningitis  No Known Allergies  Patient Measurements: Height: 5\' 7"  (170.2 cm) Weight: 164 lb 3.9 oz (74.5 kg) IBW/kg (Calculated) : 61.6   Vital Signs: Temp: 100 F (37.8 C) (05/31 0733) Temp Source: Core (Comment) (05/31 0733) BP: 113/59 mmHg (05/31 0900) Pulse Rate: 93 (05/31 0900) Intake/Output from previous day: 05/30 0701 - 05/31 0700 In: 1807.3 [P.O.:120; I.V.:1187.3; IV Piggyback:500] Out: 1610 [Urine:1610] Intake/Output from this shift: Total I/O In: 212.4 [I.V.:212.4] Out: 120 [Urine:120]  Labs:  Recent Labs  07/06/14 0930 07/06/14 2345 07/07/14 0128 07/07/14 0325  WBC 11.8* 14.8*  --  15.0*  HGB 13.2 15.2*  --  14.9  PLT 191 175  --  190  CREATININE 0.61 0.48 0.53 0.72   Estimated Creatinine Clearance: 73.9 mL/min (by C-G formula based on Cr of 0.72). No results for input(s): VANCOTROUGH, VANCOPEAK, VANCORANDOM, GENTTROUGH, GENTPEAK, GENTRANDOM, TOBRATROUGH, TOBRAPEAK, TOBRARND, AMIKACINPEAK, AMIKACINTROU, AMIKACIN in the last 72 hours.   Microbiology: Recent Results (from the past 720 hour(s))  Culture, blood (routine x 2)     Status: None (Preliminary result)   Collection Time: 07/06/14 11:30 AM  Result Value Ref Range Status   Specimen Description BLOOD RIGHT ANTECUBITAL  Final   Special Requests BOTTLES DRAWN AEROBIC AND ANAEROBIC 10CC  Final   Culture   Final           BLOOD CULTURE RECEIVED NO GROWTH TO DATE CULTURE WILL BE HELD FOR 5 DAYS BEFORE ISSUING A FINAL NEGATIVE REPORT [facility]    Report Status PENDING  Incomplete  Culture, blood (routine x 2)     Status: None (Preliminary result)   Collection Time: 07/06/14 11:40 AM  Result Value Ref Range Status   Specimen Description BLOOD RIGHT ARM  Final   Special Requests BOTTLES DRAWN AEROBIC AND ANAEROBIC 10CC  Final   Culture   Final           BLOOD CULTURE RECEIVED NO GROWTH  TO DATE CULTURE WILL BE HELD FOR 5 DAYS BEFORE ISSUING A FINAL NEGATIVE REPORT Performed at [facility]    Report Status PENDING  Incomplete  MRSA PCR Screening     Status: None   Collection Time: 07/06/14 11:51 PM  Result Value Ref Range Status   MRSA by PCR NEGATIVE NEGATIVE Final    Comment:        The GeneXpert MRSA Assay (FDA approved for NASAL specimens only), is one component of a comprehensive MRSA colonization surveillance program. It is not intended to diagnose MRSA infection nor to guide or monitor treatment for MRSA infections.     Medical History: Past Medical History  Diagnosis Date  . COPD (chronic obstructive pulmonary disease)   . Emphysema   . Bronchitis   . Hypertension   . Asthma   . Hyperlipidemia   . Shortness of breath dyspnea   . Encephalopathy 06/2014  . UTI (urinary tract infection)     Medications:  Prescriptions prior to admission  Medication Sig Dispense Refill Last Dose  . albuterol (PROAIR HFA) 108 (90 BASE) MCG/ACT inhaler 2 puffs every 4 hours as needed only  if your can't catch your breath   07/05/2014 at Unknown time  . albuterol (PROVENTIL) (2.5 MG/3ML) 0.083% nebulizer solution Take 3 mLs (2.5 mg total) by nebulization every 6 (six) hours as needed for wheezing or shortness of breath. 75 mL 12 07/05/2014 at Unknown time  .  amLODipine (NORVASC) 10 MG tablet Take 10 mg by mouth daily.   07/05/2014 at Unknown time  . aspirin 81 MG tablet Take 81 mg by mouth daily.   07/05/2014 at Unknown time  . budesonide-formoterol (SYMBICORT) 160-4.5 MCG/ACT inhaler Inhale 2 puffs into the lungs 2 (two) times daily.   07/05/2014 at Unknown time  . CALCIUM PO Take 1 tablet by mouth daily.   07/05/2014 at Unknown time  . gabapentin (NEURONTIN) 300 MG capsule Take 300 mg by mouth 4 (four) times daily.   07/05/2014 at Unknown time  . Multiple Vitamin (MULTIVITAMIN WITH MINERALS) TABS Take 1 tablet by mouth daily.   07/05/2014 at Unknown time  . olmesartan  (BENICAR) 20 MG tablet 1/2 tab by mouth daily (Patient taking differently: Take 10 mg by mouth daily. ) 30 tablet 3 07/05/2014 at Unknown time  . oxyCODONE (OXY IR/ROXICODONE) 5 MG immediate release tablet Take 1 tablet (5 mg total) by mouth every 6 (six) hours as needed for severe pain or breakthrough pain. 40 tablet 0 07/06/2014  . OxyCODONE (OXYCONTIN) 15 mg T12A 12 hr tablet Take 1 tablet (15 mg total) by mouth every 12 (twelve) hours. 30 tablet 0 07/05/2014 at Unknown time  . POTASSIUM PO Take 1 tablet by mouth daily.   07/05/2014 at Unknown time  . pravastatin (PRAVACHOL) 40 MG tablet Take 40 mg by mouth daily.   07/05/2014 at Unknown time  . rOPINIRole (REQUIP) 0.25 MG tablet Take 0.5 mg by mouth daily.    07/05/2014 at Unknown time  . tiotropium (SPIRIVA) 18 MCG inhalation capsule Place 18 mcg into inhaler and inhale daily.   07/05/2014 at Unknown time   Scheduled:  . antiseptic oral rinse  7 mL Mouth Rinse q12n4p  . arformoterol  15 mcg Nebulization BID  . budesonide  0.5 mg Nebulization BID  . ceFEPime (MAXIPIME) IV  1 g Intravenous 3 times per day  . chlorhexidine  15 mL Mouth Rinse BID  . furosemide  40 mg Intravenous Q12H  . heparin  5,000 Units Subcutaneous 3 times per day  . insulin aspart  0-9 Units Subcutaneous 6 times per day  . ipratropium-albuterol  3 mL Nebulization Q6H  . lidocaine (cardiac) 100 mg/81ml      . metronidazole  500 mg Intravenous 4 times per day  . multivitamin with minerals  1 tablet Oral Daily  . naLOXone (NARCAN)  injection  0.4 mg Intravenous Once  . pantoprazole (PROTONIX) IV  40 mg Intravenous Q24H  . potassium chloride  10 mEq Intravenous Q1 Hr x 4  . pravastatin  40 mg Oral Daily  . propofol      . propofol      . rocuronium      . sodium bicarbonate      . sodium chloride  500 mL Intravenous Once  . succinylcholine      . vancomycin  1,000 mg Intravenous Q12H   Assessment: 64 yo female here with AMS and fever (noted with recent back surgery -pain  neurostimulator removal) and now with VDRF and possible meningitis. WBC= 15, tmax= 103.2, SCr= 0.72 and CrCl ~ 70.   5/20 blood x2- ngtd  5/30 cefepime>> 5/30 vancomycin>>  Goal of Therapy:  Vancomycin trough level 15-20 mcg/ml  Plan:  -Change cefepime to 2gm IV q8h -Continue vancomycin  IV q12h -Vancomycin trough on 6/1 at 0430 -Will follow renal function, cultures and clinical progress  Harland German, Pharm D 07/07/2014 11:08 AM

## 2014-07-07 NOTE — Consult Note (Signed)
PULMONARY / CRITICAL CARE MEDICINE   Name: [name] MRN: [id number] DOB: [birth year]    ADMISSION DATE:  07/06/2014 CONSULTATION DATE:  07/07/2014  REFERRING MD :  TRH  CHIEF COMPLAINT:  AMS / agitation / tachypnea  INITIAL PRESENTATION:  64 y.o. F with chronic pain brought to [facility] ED 5/30 due to AMS.  She was admitted by [facility] and later that night became hypoxic, tachypneic and restless.  She was started on BiPAP and transferred to SDU.  PCCM was consulted for admission.   STUDIES:  CXR 5/30 >>> vascular congestion.  SIGNIFICANT EVENTS: 5/30 - admitted with AMS.  PCCM consulted later for agitation and tachypnea.   HISTORY OF PRESENT ILLNESS:  Pt is encephalopathic; therefore, this HPI is obtained from chart review. [name] is a 64 y.o. F with PMH as outlined below.  She was brought to [facility] ED 5/30 due to AMS.  Pt states that pt was just recently discharged from [facility] after admission for acute encephalopathy secondary to aspiration PNA and excessive lethargy due to oversedation from narcotics.  He says following that admission, her home narcotic regimen was adjusted / decreased.  Apparently on day of presentation, pt was initially complaining of a bad headache.  She later started drooling, confused, and was difficult to arouse.  This prompted him to bring her to ED for further evaluation.  In ED, she was somnolent but arouseable.  She was febrile to 101 but additional workup essentially negative.  She was admitted for further evaluation by [facility].  She was scheduled for MRI of lumbar and thoracic spine due to recent surgery for removal of spinal cord stimulator.  Prior to MRI, she was hypertensive to 191/95 and was agitated.  She was given antihypertensive as well as 2mg  Ativan and afterwards, became much less responsive.  MRI was attempted but pt began flailing and was unable to lie flat therefore MRI not obtained.  Fever had spiked to 103.2 and pt was intermittently hypoxic and  tachypneic; therefore, she was started on BiPAP and transferred to SDU for further evaluation.  She is apparently on 2L O2 chronically due to her COPD.  In early AM hours of 5/31, pt became more restless but remained altered.  BP remained elevated and only significant lab findings were sodium of 121. Lactic acid normal at 0.9.  She did remain febrile to 103.  UA and CXR clear.  PCCM was consulted for further recs.  She was started on precedex for restlessness / agitation.  CT head had been ordered but not yet done.  Hopes were that once on precedex, imaging could be obtained.  Unfortunately after precedex was started, pt's SBP dropped into 60's.  Precedex stopped and pt bolused with IVF's.  Pt's husband states that pt "religiously takes her pain meds".  He uses a pill box and sets out her pills each week.  He states that she has taken all of her pills as she is supposed to and on day of presentation, he actually gave her 10mg  oxycodone instead of her usual 5mg  due to her bad headache.  He was initially concerned of overdose; however, informed EDP that her pill count in her weekly pill box was correct so highly doubted so.  Of note, her UDS in ED was completely negative.   PAST MEDICAL HISTORY :   has a past medical history of COPD (chronic obstructive pulmonary disease); Emphysema; Bronchitis; Hypertension; Asthma; Hyperlipidemia; Shortness of breath dyspnea; Encephalopathy (06/2014); and UTI (urinary  tract infection).  has past surgical history that includes Back surgery; Abdominal hysterectomy; Appendectomy; Insertion / placement / revision neurostimulator; and removal of neurostimulator (06/2014). Prior to Admission medications   Medication Sig Start Date End Date Taking? Authorizing Provider  albuterol (PROAIR HFA) 108 (90 BASE) MCG/ACT inhaler 2 puffs every 4 hours as needed only  if your can't catch your breath 04/02/14  Yes Nyoka CowdenMichael B Wert, MD  albuterol (PROVENTIL) (2.5 MG/3ML) 0.083% nebulizer  solution Take 3 mLs (2.5 mg total) by nebulization every 6 (six) hours as needed for wheezing or shortness of breath. 04/02/14  Yes Nyoka CowdenMichael B Wert, MD  amLODipine (NORVASC) 10 MG tablet Take 10 mg by mouth daily.   Yes Historical Provider, MD  aspirin 81 MG tablet Take 81 mg by mouth daily.   Yes Historical Provider, MD  budesonide-formoterol (SYMBICORT) 160-4.5 MCG/ACT inhaler Inhale 2 puffs into the lungs 2 (two) times daily.   Yes Historical Provider, MD  CALCIUM PO Take 1 tablet by mouth daily.   Yes Historical Provider, MD  gabapentin (NEURONTIN) 300 MG capsule Take 300 mg by mouth 4 (four) times daily. 05/14/14  Yes Historical Provider, MD  Multiple Vitamin (MULTIVITAMIN WITH MINERALS) TABS Take 1 tablet by mouth daily.   Yes Historical Provider, MD  olmesartan (BENICAR) 20 MG tablet 1/2 tab by mouth daily Patient taking differently: Take 10 mg by mouth daily.  04/16/14  Yes Tammy S Parrett, NP  oxyCODONE (OXY IR/ROXICODONE) 5 MG immediate release tablet Take 1 tablet (5 mg total) by mouth every 6 (six) hours as needed for severe pain or breakthrough pain. 06/02/14  Yes Vassie Lollarlos Madera, MD  OxyCODONE (OXYCONTIN) 15 mg T12A 12 hr tablet Take 1 tablet (15 mg total) by mouth every 12 (twelve) hours. 06/02/14  Yes Vassie Lollarlos Madera, MD  POTASSIUM PO Take 1 tablet by mouth daily.   Yes Historical Provider, MD  pravastatin (PRAVACHOL) 40 MG tablet Take 40 mg by mouth daily.   Yes Historical Provider, MD  rOPINIRole (REQUIP) 0.25 MG tablet Take 0.5 mg by mouth daily.    Yes Historical Provider, MD  tiotropium (SPIRIVA) 18 MCG inhalation capsule Place 18 mcg into inhaler and inhale daily.   Yes Historical Provider, MD   No Known Allergies  FAMILY HISTORY:  Family History  Problem Relation Age of Onset  . Emphysema Sister     smoked  . Heart disease Sister   . Ovarian cancer Sister     SOCIAL HISTORY:  reports that she has been smoking Cigarettes.  She has a 45 pack-year smoking history. She has never  used smokeless tobacco. She reports that she does not drink alcohol or use illicit drugs.  REVIEW OF SYSTEMS:  Unable to obtain as pt is encephalopathic.  SUBJECTIVE: lethargic on NIMV  VITAL SIGNS: Temp:  [99.1 F (37.3 C)-103.2 F (39.6 C)] 102.7 F (39.3 C) (05/30 2330) Pulse Rate:  [85-126] 121 (05/30 2330) Resp:  [20-43] 26 (05/30 2330) BP: (162-198)/(74-108) 162/88 mmHg (05/30 2330) SpO2:  [87 %-100 %] 100 % (05/31 0158) FiO2 (%):  [50 %] 50 % (05/31 0158) Weight:  [74.3 kg (163 lb 12.8 oz)-74.5 kg (164 lb 3.9 oz)] 74.5 kg (164 lb 3.9 oz) (05/30 2252) HEMODYNAMICS:   VENTILATOR SETTINGS: Vent Mode:  [-]  FiO2 (%):  [50 %] 50 % INTAKE / OUTPUT: Intake/Output      05/30 0701 - 05/31 0700   P.O. 120   I.V. (mL/kg) 438.8 (5.9)   IV Piggyback 250  Total Intake(mL/kg) 808.8 (10.9)   Urine (mL/kg/hr) 475   Total Output 475   Net +333.8       Urine Occurrence 5 x   Stool Occurrence 1 x     PHYSICAL EXAMINATION: General: WDWN female, in NAD. Neuro: Obtunded, does not answer questions or follow commands.  Opens eyes to voice and withdraws to pain. No focal deficits noted. HEENT: [place]/AT. PERRL, sclerae anicteric. BiPAP in place. Cardiovascular: Tachy, regular, no M/R/G.  Lungs: Respirations shallow and rapid.  CTA bilaterally, No W/R/R.  Abdomen: BS x 4, soft, NT/ND.  Musculoskeletal: No gross deformities, no edema.  Skin: Intact, warm, no rashes.  LABS:  CBC  Recent Labs Lab 07/06/14 0930 07/06/14 2345  WBC 11.8* 14.8*  HGB 13.2 15.2*  HCT 40.0 45.4  PLT 191 175   Coag's No results for input(s): APTT, INR in the last 168 hours. BMET  Recent Labs Lab 07/06/14 0930 07/06/14 2345  NA 130* 121*  K 3.8 3.6  CL 94* 85*  CO2 27 25  BUN 7 6  CREATININE 0.61 0.48  GLUCOSE 103* 129*   Electrolytes  Recent Labs Lab 07/06/14 0930 07/06/14 2345  CALCIUM 8.7* 8.8*   Sepsis Markers  Recent Labs Lab 07/06/14 2345  LATICACIDVEN 0.9   ABG  Recent  Labs Lab 07/06/14 1155 07/06/14 2206  PHART 7.316* 7.326*  PCO2ART 52.5* 58.4*  PO2ART 69.0* 71.6*   Liver Enzymes  Recent Labs Lab 07/06/14 0930 07/06/14 2345  AST 22 31  ALT 18 21  ALKPHOS 49 54  BILITOT 0.6 1.0  ALBUMIN 3.7 3.8   Cardiac Enzymes No results for input(s): TROPONINI, PROBNP in the last 168 hours. Glucose No results for input(s): GLUCAP in the last 168 hours.  Imaging Dg Chest 2 View  07/06/2014   CLINICAL DATA:  Cough and chest pain for 2 weeks  EXAM: CHEST  2 VIEW  COMPARISON:  06/02/2014  FINDINGS: Postsurgical changes are noted in the thoracolumbar spine. A previously seen spinal stimulator has been removed in the interval. Cardiac shadow is stable. Mild interstitial changes are again seen stable from the prior study. No new focal infiltrate or sizable effusion is noted.  IMPRESSION: Chronic interstitial changes without acute abnormality.   Electronically Signed   By: Alcide Clever M.D.   On: 07/06/2014 09:39   Dg Chest Port 1 View  07/06/2014   CLINICAL DATA:  Acute onset of chest congestion and shortness of breath. Initial encounter.  EXAM: PORTABLE CHEST - 1 VIEW  COMPARISON:  Chest radiograph performed earlier today at 9:26 a.m.  FINDINGS: The lungs are well-aerated. Vascular congestion is noted, with increased interstitial markings, concerning for mild interstitial edema. No pleural effusion or pneumothorax is seen. There is no evidence of pleural effusion or pneumothorax.  The cardiomediastinal silhouette is borderline normal in size. No acute osseous abnormalities are seen.  IMPRESSION: Vascular congestion, with increased interstitial markings, concerning for mild interstitial edema.   Electronically Signed   By: Roanna Raider M.D.   On: 07/06/2014 23:25     ASSESSMENT / PLAN:  NEUROLOGIC A:   Acute encephalopathy - unclear etiology as this point ? Opiate withdrawal - husband reports pt religiously takes her pain meds; however, UDS negative and narcan  in ED with no effect Intermittent agitation ? PRES given hypertension Chronic pain syndrome UDS negative - though pt's husband states that she has been religiously taking her opiates recently P:   Continue to monitor. Ativan PRN, caution Will need  MRI brian when resp status permits Send ammonia, TSH, folate, B12. Serum drug screen pending. Hold oxycodone, ropinirole.  PULMONARY A: Acute on chronic hypoxemic and hypercarbic respiratory failure COPD without evidence of exacerbation Tobacco use disorder P:   Continue BiPAP as needed. Continue supplemental O2 to maintain SpO2 > 88 - 90%. If deteriorates, will require intubation. Budesonide / Arformoterol in lieu of outpatient Symbicort. DuoNebs in lieu of outpatient Spiriva. CXR in AM. Tobacco cessation counseling. Consider lasix  CARDIOVASCULAR A:  Hypotension following initiation of precedex at 0.5 mcg/hr - resolved after 1L IVF Hypertension - received hydralazine and lopressor  Sinus tach - likely due to fever P:  Lower precedex dose, if becomes hypotensive again, will need to d/c precedex. Hydralazine PRN. Hold amlodipine, irbesartan. Continue to monitor hemodynamics. Goal 25% reduction from highest MAp on admissions  RENAL A:   Hyponatremia Pseudoypocalcemia hypoK P:   NS @ 100, give, consider cvp, may need lasix Send serum and urine osmolality. Send ionized calcium. BMP in AM. K supp  GASTROINTESTINAL A:  Nutrition P:   NPO for now. Get lft in am  ppi  HEMATOLOGIC A:   VTE Prophylaxis P:  SCD's / Heparin. CBC in AM.  INFECTIOUS A:   Fever with mild leukocytosis - though no clear evidence of infection thus far P:   BCx2 5/30 > UCx 5/30 > Abx:  Vanc, start date 5/30, day 2/x. Abx: Cefepime, start date 5/30, day 2/x. Check PCT to limit abx exposure.  May need LP, would prefer MRI done prior with CT findings  ENDOCRINE A:   No acute issues  P:   No interventions required. Get  TSH  Family updated: Husband at bedside.  Interdisciplinary Family Meeting v Palliative Care Meeting:  Due by: 07/13/14.   Rutherford Guys, [place] [facility] Pager: [phone]  or [phone] 07/07/2014, 2:11 AM  STAFF NOTE: I, Rory Percy, MD FACP have personally reviewed patient's available data, including medical history, events of note, physical examination and test results as part of my evaluation. I have discussed with resident/NP and other care providers such as pharmacist, RN and RRT. In addition, I personally evaluated patient and elicited key findings of: unresposnove with worsening reps status, no airway protection skills - requires intubation, ronchi, crackles, lasix, after ett, wil place line get cvp, consider MRI head / back  / LP, will increase dosing ABX for BBB coverage, concern is also PRES and meningitis s/p removal if stimulator, abg post intubation, add ppi, add ssi, start TF earlier, fent to avoid WD, likley to dc precedex, get eeg, i updated daughter and husband The patient is critically ill with multiple organ systems failure and requires high complexity decision making for assessment and support, frequent evaluation and titration of therapies, application of advanced monitoring technologies and extensive interpretation of multiple databases.   Critical Care Time devoted to patient care services described in this note is30  Minutes. This time reflects time of care of this signee: Rory Percy, MD FACP. This critical care time does not reflect procedure time, or teaching time or supervisory time of PA/NP/Med student/Med Resident etc but could involve care discussion time. Rest per NP/medical resident whose note is outlined above and that I agree with   Mcarthur Rossetti. Tyson Alias, MD, FACP Pgr: [phone] [place] [facility] 07/07/2014 9:45 AM

## 2014-07-07 NOTE — Procedures (Signed)
Intubation Procedure Note [name] [id number] 1950-08-05  Procedure: Intubation Indications: Respiratory insufficiency  Procedure Details Consent: Risks of procedure as well as the alternatives and risks of each were explained to the (patient/caregiver).  Consent for procedure obtained. Time Out: Verified patient identification, verified procedure, site/side was marked, verified correct patient position, special equipment/implants available, medications/allergies/relevent history reviewed, required imaging and test results available.  Performed  Maximum sterile technique was used including gloves, gown, hand hygiene and mask.  MAC and 4    Evaluation Hemodynamic Status: BP stable throughout; O2 sats: stable throughout Patient's Current Condition: stable Complications: No apparent complications Patient did tolerate procedure well. Chest X-ray ordered to verify placement.  CXR: pending.   [name],Daisy J. 07/07/2014  Pus , aspiration old all on cords etc Tolerated procedure fine  Daisy Rossettianiel J. Tyson AliasFeinstein, MD, FACP Pgr: [phone] [place] [facility]

## 2014-07-07 NOTE — Procedures (Signed)
Central Venous Catheter Insertion Procedure Note Dustin FolksRebecca E Griffing [id number] 1950/10/22  Procedure: Insertion of Central Venous Catheter Indications: Assessment of intravascular volume, Drug and/or fluid administration and Frequent blood sampling  Procedure Details Consent: Risks of procedure as well as the alternatives and risks of each were explained to the (patient/caregiver).  Consent for procedure obtained. Time Out: Verified patient identification, verified procedure, site/side was marked, verified correct patient position, special equipment/implants available, medications/allergies/relevent history reviewed, required imaging and test results available.  Performed  Maximum sterile technique was used including antiseptics, cap, gloves, gown, hand hygiene, mask and sheet. Skin prep: Chlorhexidine; local anesthetic administered A antimicrobial bonded/coated triple lumen catheter was placed in the left internal jugular vein using the Seldinger technique. Ultrasound guidance used.Yes.   Catheter placed to 20 cm. Blood aspirated via all 3 ports and then flushed x 3. Line sutured x 2 and dressing applied.  Evaluation Blood flow good Complications: No apparent complications Patient did tolerate procedure well. Chest X-ray ordered to verify placement.  CXR: pending.  Brett CanalesSteve Minor ACNP [facility] Pager [phone] till 3 pm If no answer page [phone] 07/07/2014, 10:56 AM   Tolerated well US Mcarthur Rossettianiel J. Tyson AliasFeinstein, MD, FACP Pgr: [phone] [place] [facility]

## 2014-07-07 NOTE — Progress Notes (Signed)
ANTIBIOTIC CONSULT NOTE - FOLLOW UP  Pharmacy Consult for Acyclovir Indication: rule out HSV meningitis  No Known Allergies  Patient Measurements: Height: 5\' 7"  (170.2 cm) Weight: 164 lb 3.9 oz (74.5 kg) IBW/kg (Calculated) : 61.6  Vital Signs: Temp: 101.5 F (38.6 C) (05/31 1800) Temp Source: Rectal (05/31 1800) BP: 102/52 mmHg (05/31 1900) Pulse Rate: 124 (05/31 1800)  Labs:  Recent Labs  07/06/14 0930 07/06/14 2345 07/07/14 0128 07/07/14 0325  WBC 11.8* 14.8*  --  15.0*  HGB 13.2 15.2*  --  14.9  PLT 191 175  --  190  CREATININE 0.61 0.48 0.53 0.72   Estimated Creatinine Clearance: 73.9 mL/min (by C-G formula based on Cr of 0.72).  Assessment:  s/p LP.  Adding Acyclovir for HSV meningitis coverage.  Day # 2 Vancomycin and Cefepime.  Goal of Therapy:  appropriate Acyclovir dose for renal function and infection  Plan:   Acyclovir 750 mg (~ 10 mg/kg) IV q8hrs.  Continue Vancomycin 1 gram IV q12hrs and Cefepime 2 grams IV q8hrs.  Will follow renal function, culture data, progress.  Vancomycin trough level to be drawn prior to am dose.  Dennie Fettersgan, Kyree Fedorko Donovan, [place] Pager: [phone] 07/07/2014,8:44 PM

## 2014-07-07 NOTE — Progress Notes (Signed)
RT note-rate increased to 30 per Dr. Tyson AliasFeinstein.

## 2014-07-07 NOTE — Progress Notes (Signed)
Called nurse about EEG; pt getting multiple scans; nurse stated patient will be unavailable until after 6:00 PM; advised we will do EEG in the morning.

## 2014-07-07 NOTE — Progress Notes (Signed)
Transported patient on BIPAP to CT and back. No complications noted. Vitals stable.

## 2014-07-07 NOTE — Progress Notes (Signed)
Blood gas results called to Dr. Tyson AliasFeinstein, new vent orders received.  Will continue to monitor. [place], Mitzi HansenJessica Marie

## 2014-07-07 NOTE — Procedures (Addendum)
Lumbar Puncture Procedure Note  Pre-operative Diagnosis: r/o meningitis  Post-operative Diagnosis: r/o meningitis, elevated opening pressur  Indications: Diagnostic  Procedure Details   Consent: Informed consent was obtained. Risks of the procedure were discussed including: infection, bleeding, pain and headache.  The patient was positioned under sterile conditions. Betadine solution and sterile drapes were utilized. A spinal needle was inserted at the L3 - L4 interspace.  Spinal fluid was obtained and sent to the laboratory.  Findings 10mL of bloody spinal fluid was obtained. Cleared, was traumatic or was chronic sterile cerumen noted on MRI Opening Pressure: 29 cm H2O pressure.  Closing Pressure:  8 cm H2O pressure.  Complications:  None; patient tolerated the procedure well.        Condition: stable  Plan Bed rest for 5 hours. Tylenol 650 mg for pain.  Mcarthur Rossettianiel J. Tyson AliasFeinstein, MD, FACP Pgr: [phone] [place] [facility]

## 2014-07-08 ENCOUNTER — Inpatient Hospital Stay (HOSPITAL_COMMUNITY): Payer: Medicare Other

## 2014-07-08 DIAGNOSIS — R0989 Other specified symptoms and signs involving the circulatory and respiratory systems: Secondary | ICD-10-CM

## 2014-07-08 DIAGNOSIS — R4182 Altered mental status, unspecified: Secondary | ICD-10-CM

## 2014-07-08 DIAGNOSIS — R4020 Unspecified coma: Secondary | ICD-10-CM

## 2014-07-08 LAB — CBC WITH DIFFERENTIAL/PLATELET
Basophils Absolute: 0 10*3/uL (ref 0.0–0.1)
Basophils Relative: 0 % (ref 0–1)
EOS ABS: 0 10*3/uL (ref 0.0–0.7)
Eosinophils Relative: 0 % (ref 0–5)
HCT: 38.1 % (ref 36.0–46.0)
Hemoglobin: 12.8 g/dL (ref 12.0–15.0)
LYMPHS PCT: 4 % — AB (ref 12–46)
Lymphs Abs: 0.4 10*3/uL — ABNORMAL LOW (ref 0.7–4.0)
MCH: 29.6 pg (ref 26.0–34.0)
MCHC: 33.6 g/dL (ref 30.0–36.0)
MCV: 88.2 fL (ref 78.0–100.0)
MONO ABS: 1.1 10*3/uL — AB (ref 0.1–1.0)
MONOS PCT: 10 % (ref 3–12)
NEUTROS ABS: 9.6 10*3/uL — AB (ref 1.7–7.7)
Neutrophils Relative %: 86 % — ABNORMAL HIGH (ref 43–77)
Platelets: 161 10*3/uL (ref 150–400)
RBC: 4.32 MIL/uL (ref 3.87–5.11)
RDW: 13.9 % (ref 11.5–15.5)
WBC: 11.2 10*3/uL — ABNORMAL HIGH (ref 4.0–10.5)

## 2014-07-08 LAB — BLOOD GAS, ARTERIAL
Acid-Base Excess: 0.2 mmol/L (ref 0.0–2.0)
Bicarbonate: 24.5 mEq/L — ABNORMAL HIGH (ref 20.0–24.0)
DRAWN BY: 308601
FIO2: 0.4 %
O2 Saturation: 99.3 %
PEEP: 5 cmH2O
PH ART: 7.386 (ref 7.350–7.450)
Patient temperature: 99.6
RATE: 30 resp/min
TCO2: 25.8 mmol/L (ref 0–100)
VT: 460 mL
pCO2 arterial: 42.2 mmHg (ref 35.0–45.0)
pO2, Arterial: 189 mmHg — ABNORMAL HIGH (ref 80.0–100.0)

## 2014-07-08 LAB — COMPREHENSIVE METABOLIC PANEL
ALK PHOS: 39 U/L (ref 38–126)
ALT: 15 U/L (ref 14–54)
AST: 25 U/L (ref 15–41)
Albumin: 2.5 g/dL — ABNORMAL LOW (ref 3.5–5.0)
Anion gap: 11 (ref 5–15)
BUN: 14 mg/dL (ref 6–20)
CO2: 25 mmol/L (ref 22–32)
Calcium: 7.9 mg/dL — ABNORMAL LOW (ref 8.9–10.3)
Chloride: 90 mmol/L — ABNORMAL LOW (ref 101–111)
Creatinine, Ser: 0.66 mg/dL (ref 0.44–1.00)
GFR calc non Af Amer: >60 mL/min (ref >60)
Glucose, Bld: 96 mg/dL (ref 65–99)
Potassium: 3.4 mmol/L — ABNORMAL LOW (ref 3.5–5.1)
Sodium: 126 mmol/L — ABNORMAL LOW (ref 135–145)
TOTAL PROTEIN: 4.9 g/dL — AB (ref 6.5–8.1)
Total Bilirubin: 0.6 mg/dL (ref 0.3–1.2)

## 2014-07-08 LAB — VANCOMYCIN, TROUGH: Vancomycin Tr: 12 ug/mL (ref 10.0–20.0)

## 2014-07-08 LAB — GLUCOSE, CAPILLARY
GLUCOSE-CAPILLARY: 116 mg/dL — AB (ref 65–99)
GLUCOSE-CAPILLARY: 131 mg/dL — AB (ref 65–99)
GLUCOSE-CAPILLARY: 145 mg/dL — AB (ref 65–99)
Glucose-Capillary: 107 mg/dL — ABNORMAL HIGH (ref 65–99)
Glucose-Capillary: 135 mg/dL — ABNORMAL HIGH (ref 65–99)
Glucose-Capillary: 117 mg/dL — ABNORMAL HIGH (ref 65–99)

## 2014-07-08 LAB — CALCIUM, IONIZED: Calcium, Ionized, Serum: 4.2 mg/dL — ABNORMAL LOW (ref 4.5–5.6)

## 2014-07-08 LAB — MAGNESIUM: Magnesium: 1.5 mg/dL — ABNORMAL LOW (ref 1.7–2.4)

## 2014-07-08 LAB — PHOSPHORUS: PHOSPHORUS: 2.2 mg/dL — AB (ref 2.5–4.6)

## 2014-07-08 MED ORDER — FENTANYL CITRATE (PF) 100 MCG/2ML IJ SOLN
50.0000 ug | INTRAMUSCULAR | Status: DC | PRN
  Administered 2014-07-08 (×2): 50 ug via INTRAVENOUS
  Filled 2014-07-08: qty 2

## 2014-07-08 MED ORDER — FENTANYL CITRATE (PF) 100 MCG/2ML IJ SOLN
INTRAMUSCULAR | Status: AC
  Filled 2014-07-08: qty 2

## 2014-07-08 MED ORDER — VITAL HIGH PROTEIN PO LIQD
1000.0000 mL | ORAL | Status: DC
  Administered 2014-07-08 (×2): 1000 mL
  Filled 2014-07-08 (×3): qty 1000

## 2014-07-08 MED ORDER — MAGNESIUM SULFATE 2 GM/50ML IV SOLN
2.0000 g | Freq: Once | INTRAVENOUS | Status: AC
  Administered 2014-07-08: 2 g via INTRAVENOUS
  Filled 2014-07-08: qty 50

## 2014-07-08 MED ORDER — VANCOMYCIN HCL 10 G IV SOLR
1250.0000 mg | Freq: Two times a day (BID) | INTRAVENOUS | Status: DC
  Administered 2014-07-08 – 2014-07-09 (×2): 1250 mg via INTRAVENOUS
  Filled 2014-07-08 (×3): qty 1250

## 2014-07-08 MED ORDER — FENTANYL BOLUS VIA INFUSION
50.0000 ug | INTRAVENOUS | Status: DC | PRN
  Administered 2014-07-08: 25 ug via INTRAVENOUS
  Administered 2014-07-09 (×2): 50 ug via INTRAVENOUS
  Filled 2014-07-08: qty 50

## 2014-07-08 MED ORDER — FENTANYL CITRATE (PF) 100 MCG/2ML IJ SOLN
50.0000 ug | Freq: Once | INTRAMUSCULAR | Status: AC
  Administered 2014-07-08: 50 ug via INTRAVENOUS
  Filled 2014-07-08: qty 2

## 2014-07-08 MED ORDER — POTASSIUM PHOSPHATES 15 MMOLE/5ML IV SOLN
20.0000 mmol | Freq: Once | INTRAVENOUS | Status: AC
  Administered 2014-07-08: 20 mmol via INTRAVENOUS
  Filled 2014-07-08: qty 6.67

## 2014-07-08 MED ORDER — MIDAZOLAM HCL 2 MG/2ML IJ SOLN: 2.0000 mg | INTRAMUSCULAR | Status: DC | PRN

## 2014-07-08 MED ORDER — SODIUM CHLORIDE 0.9 % IV BOLUS (SEPSIS)
1000.0000 mL | Freq: Once | INTRAVENOUS | Status: AC
  Administered 2014-07-08: 1000 mL via INTRAVENOUS

## 2014-07-08 MED ORDER — OSMOLITE 1.2 CAL PO LIQD
1000.0000 mL | ORAL | Status: DC
  Administered 2014-07-08: 1000 mL
  Filled 2014-07-08 (×6): qty 1000

## 2014-07-08 MED ORDER — SODIUM CHLORIDE 0.9 % IV SOLN
25.0000 ug/h | INTRAVENOUS | Status: DC
  Administered 2014-07-08: 25 ug/h via INTRAVENOUS
  Filled 2014-07-08: qty 50

## 2014-07-08 MED ORDER — FUROSEMIDE 10 MG/ML IJ SOLN
40.0000 mg | Freq: Three times a day (TID) | INTRAMUSCULAR | Status: DC
  Administered 2014-07-08 – 2014-07-10 (×7): 40 mg via INTRAVENOUS
  Filled 2014-07-08 (×8): qty 4

## 2014-07-08 NOTE — Progress Notes (Signed)
PT Cancellation Note and D/C  Patient Details Name: Daisy FolksRebecca E Pham MRN: [id number] DOB: [birth year]   Cancelled Treatment:    Reason Eval/Treat Not Completed: Medical issues which prohibited therapy (Pt intubated yesterday.  Will sign off as pt sedated.  Please reorder PT when appropriate.)Thanks.   Tawni MillersWhite, Cleatus Goodin F 07/08/2014, 9:42 AM Eber Jonesawn Inocencio Roy,PT Acute Rehabilitation [phone] (pager)

## 2014-07-08 NOTE — Progress Notes (Addendum)
ANTIBIOTIC CONSULT NOTE - Follow-up  Pharmacy Consult for cefepime, vancomycin and acyclovir Indication: meningitis  No Known Allergies  Patient Measurements: Height: 5\' 7"  (170.2 cm) Weight: 164 lb 3.9 oz (74.5 kg) IBW/kg (Calculated) : 61.6   Vital Signs: Temp: 99.8 F (37.7 C) (06/01 0400) Temp Source: Oral (06/01 0400) BP: 120/49 mmHg (06/01 0319) Pulse Rate: 122 (06/01 0319) Intake/Output from previous day: 05/31 0701 - 06/01 0700 In: 2950.5 [I.V.:635.5; IV Piggyback:2315] Out: 1415 [Urine:1415] Intake/Output from this shift: Total I/O In: 535.2 [I.V.:170.2; IV Piggyback:365] Out: 1060 [Urine:1060]  Labs:  Recent Labs  07/06/14 2345 07/07/14 0128 07/07/14 0325 07/08/14 0430  WBC 14.8*  --  15.0* 11.2*  HGB 15.2*  --  14.9 12.8  PLT 175  --  190 161  CREATININE 0.48 0.53 0.72 0.66   Estimated Creatinine Clearance: 73.9 mL/min (by C-G formula based on Cr of 0.66).  Recent Labs  07/08/14 0430  VANCOTROUGH 12     Microbiology: Recent Results (from the past 720 hour(s))  Urine culture     Status: None   Collection Time: 07/06/14 11:16 AM  Result Value Ref Range Status   Specimen Description URINE, CLEAN CATCH  Final   Special Requests NONE  Final   Colony Count NO GROWTH [facility]formed at [facility]   Final   Culture NO GROWTH Performed at [facility]   Final   Report Status 07/07/2014 FINAL  Final  Culture, blood (routine x 2)     Status: None (Preliminary result)   Collection Time: 07/06/14 11:30 AM  Result Value Ref Range Status   Specimen Description BLOOD RIGHT ANTECUBITAL  Final   Special Requests BOTTLES DRAWN AEROBIC AND ANAEROBIC 10CC  Final   Culture   Final           BLOOD CULTURE RECEIVED NO GROWTH TO DATE CULTURE WILL BE HELD FOR 5 DAYS BEFORE ISSUING A FINAL NEGATIVE REPORT Performed at [facility]    Report Status PENDING  Incomplete  Culture, blood (routine x 2)     Status: None (Preliminary result)   Collection Time: 07/06/14 11:40 AM  Result Value Ref Range Status   Specimen Description BLOOD RIGHT ARM  Final   Special Requests BOTTLES DRAWN AEROBIC AND ANAEROBIC 10CC  Final   Culture   Final           BLOOD CULTURE RECEIVED NO GROWTH TO DATE CULTURE WILL BE HELD FOR 5 DAYS BEFORE ISSUING A FINAL NEGATIVE REPORT Note: Culture results may be compromised due to an excessive volume of blood received in culture bottles. Performed at [facility]    Report Status PENDING  Incomplete  MRSA PCR Screening     Status: None   Collection Time: 07/06/14 11:51 PM  Result Value Ref Range Status   MRSA by PCR NEGATIVE NEGATIVE Final    Comment:        The GeneXpert MRSA Assay (FDA approved for NASAL specimens only), is one component of a comprehensive MRSA colonization surveillance program. It is not intended to diagnose MRSA infection nor to guide or monitor treatment for MRSA infections.   Gram stain     Status: None   Collection Time: 07/07/14  8:15 PM  Result Value Ref Range Status   Specimen Description CSF  Final   Special Requests NONE  Final   Gram Stain   Final    CYTOSPUN WBC PRESENT,BOTH PMN AND MONONUCLEAR NO ORGANISMS SEEN    Report Status  07/07/2014 FINAL  Final    Assessment: 64 yo female on Cefepime, Vancomycin, and Acyclovir for r/o meningitis. Vancomycin trough 12 mcg/ml (subtherapeutic) on 1gm IV q12h. SCr remains stable. UOP 0.8 ml/kg/hr. WBC down to 11.2. Tm 101.5, Tc 99.8.  5/30 bld x2- ngtd 5/30 urine>>ngtd  5/30 cefepime>> 5/30 vancomycin>> 5/31 acyclovir>>  Goal of Therapy:  Vancomycin trough level 15-20 mcg/ml  Plan:  -Continue Cefepime to 2gm IV q8h -Change vancomycin to  IV q12h -Continue acyclovir  IV q8h -Will follow renal function, cultures and clinical progress -Check vancomycin trough at [facility]  Christoper Fabian, PharmD, BCPS Clinical pharmacist, pager [phone]  07/08/2014 6:55 AM

## 2014-07-08 NOTE — Consult Note (Addendum)
NEURO HOSPITALIST CONSULT NOTE    Reason for Consult: PRES  HPI:                                                                                                                                         History obtained from chart   [name] is an 64 y.o. female with past medical history of COPD, chronic respiratory failure on 2 L nasal cannula at baseline(noncompliant with it), still smoking, chronic back pain secondary to multiple lumbar surgeries in the past, Hypertension, hyperlipidemia, Recently discharged from [facility] secondary to acute encephalopathy secondary to aspiration pneumonia , and excessive lethargy due to narcotics , patient was brought by her husband and given worsening mental status this a.m. , husband report since recent discharge and adjustment of pain medication where her OxyContin and oxycodone has been decreased he has been in charge of giving her pain medications , medications bottles with him , reports in the morning his wife was drooling, confused, hard to arouse, in ED workup was significant for fever of 101, mild leukocytosis at 11.8, negative urinalysis, negative chest x-ray, urine drug screen was negative (even though husband confirmed he was giving her pain medicine as scheduled ), ABG significant for mild respiratory acidosis with PCO2 of 52 which is her baseline , husband reports patient had recent surgery before 10 days by Dr. Verlan Friends (pain medicine physician ) where she has pain in new stimulator removal as it was nonfunctioning.  Patient was placed on Vancomycin and Cefepime. While hospitalized patient went into respiratory failure requiring intubation, Temperature increased to 103.2. Patient underwent a LP which was traumatic but showed no source of infection. While in hospital she has been hyponatremic 212-126, Hypokalemic 3.4, febrile with leukocytosis. Urin culture, blood culture and LP show no source of infection. She is  currently on Vancomycin, Cefepime and Acyclovir.   Neurology was consulted for Encephalopathy of unknown etiology.    Past Medical History  Diagnosis Date  . COPD (chronic obstructive pulmonary disease)   . Emphysema   . Bronchitis   . Hypertension   . Asthma   . Hyperlipidemia   . Shortness of breath dyspnea   . Encephalopathy 06/2014  . UTI (urinary tract infection)     Past Surgical History  Procedure Laterality Date  . Back surgery    . Abdominal hysterectomy    . Appendectomy    . Insertion / placement / revision neurostimulator    . Removal of neurostimulator  06/2014    Family History  Problem Relation Age of Onset  . Emphysema Sister     smoked  . Heart disease Sister   . Ovarian cancer Sister     Social History:  reports that she has been smoking Cigarettes.  She  has a 45 pack-year smoking history. She has never used smokeless tobacco. She reports that she does not drink alcohol or use illicit drugs.  No Known Allergies  MEDICATIONS:                                                                                                                     Prior to Admission:  Prescriptions prior to admission  Medication Sig Dispense Refill Last Dose  . albuterol (PROAIR HFA) 108 (90 BASE) MCG/ACT inhaler 2 puffs every 4 hours as needed only  if your can't catch your breath   07/05/2014 at Unknown time  . albuterol (PROVENTIL) (2.5 MG/3ML) 0.083% nebulizer solution Take 3 mLs (2.5 mg total) by nebulization every 6 (six) hours as needed for wheezing or shortness of breath. 75 mL 12 07/05/2014 at Unknown time  . amLODipine (NORVASC) 10 MG tablet Take 10 mg by mouth daily.   07/05/2014 at Unknown time  . aspirin 81 MG tablet Take 81 mg by mouth daily.   07/05/2014 at Unknown time  . budesonide-formoterol (SYMBICORT) 160-4.5 MCG/ACT inhaler Inhale 2 puffs into the lungs 2 (two) times daily.   07/05/2014 at Unknown time  . CALCIUM PO Take 1 tablet by mouth daily.   07/05/2014 at  Unknown time  . gabapentin (NEURONTIN) 300 MG capsule Take 300 mg by mouth 4 (four) times daily.   07/05/2014 at Unknown time  . Multiple Vitamin (MULTIVITAMIN WITH MINERALS) TABS Take 1 tablet by mouth daily.   07/05/2014 at Unknown time  . olmesartan (BENICAR) 20 MG tablet 1/2 tab by mouth daily (Patient taking differently: Take 10 mg by mouth daily. ) 30 tablet 3 07/05/2014 at Unknown time  . oxyCODONE (OXY IR/ROXICODONE) 5 MG immediate release tablet Take 1 tablet (5 mg total) by mouth every 6 (six) hours as needed for severe pain or breakthrough pain. 40 tablet 0 07/06/2014  . OxyCODONE (OXYCONTIN) 15 mg T12A 12 hr tablet Take 1 tablet (15 mg total) by mouth every 12 (twelve) hours. 30 tablet 0 07/05/2014 at Unknown time  . POTASSIUM PO Take 1 tablet by mouth daily.   07/05/2014 at Unknown time  . pravastatin (PRAVACHOL) 40 MG tablet Take 40 mg by mouth daily.   07/05/2014 at Unknown time  . rOPINIRole (REQUIP) 0.25 MG tablet Take 0.5 mg by mouth daily.    07/05/2014 at Unknown time  . tiotropium (SPIRIVA) 18 MCG inhalation capsule Place 18 mcg into inhaler and inhale daily.   07/05/2014 at Unknown time   Scheduled: . acyclovir  750 mg Intravenous 3 times per day  . antiseptic oral rinse  7 mL Mouth Rinse QID  . arformoterol  15 mcg Nebulization BID  . budesonide  0.5 mg Nebulization BID  . ceFEPime (MAXIPIME) IV  2 g Intravenous 3 times per day  . chlorhexidine  15 mL Mouth Rinse BID  . feeding supplement (VITAL HIGH PROTEIN)  1,000 mL Per Tube Q24H  . furosemide  40 mg Intravenous 3 times  per day  . heparin  5,000 Units Subcutaneous 3 times per day  . insulin aspart  0-9 Units Subcutaneous 6 times per day  . ipratropium-albuterol  3 mL Nebulization Q6H  . metronidazole  500 mg Intravenous 4 times per day  . multivitamin with minerals  1 tablet Oral Daily  . naLOXone (NARCAN)  injection  0.4 mg Intravenous Once  . pantoprazole (PROTONIX) IV  40 mg Intravenous Q24H  . potassium phosphate IVPB  (mmol)  20 mmol Intravenous Once  . pravastatin  40 mg Oral Daily  . sodium chloride  10-40 mL Intracatheter Q12H  . vancomycin  1,250 mg Intravenous Q12H   Continuous: . sodium chloride 10 mL/hr at 07/07/14 1750  . fentaNYL infusion INTRAVENOUS 25 mcg/hr (07/08/14 0800)   WJX:BJYNWGNFAOZHY **OR** acetaminophen, albuterol, alum & mag hydroxide-simeth, bisacodyl, fentaNYL, hydrALAZINE, labetalol, midazolam, midazolam, ondansetron **OR** ondansetron (ZOFRAN) IV, polyethylene glycol, sodium chloride   ROS:                                                                                                                                       History obtained from unobtainable from patient due to mental status   Blood pressure 113/50, pulse 103, temperature 101.1 F (38.4 C), temperature source Oral, resp. rate 30, height 5\' 7"  (1.702 m), weight 74.5 kg (164 lb 3.9 oz), SpO2 100 %.   Neurologic Examination:                                                                                                      HEENT-  Normocephalic, no lesions, without obvious abnormality.  Normal external eye and conjunctiva.  Normal TM's bilaterally.  Normal auditory canals and external ears. Normal external nose, mucus membranes and septum.  Normal pharynx. Cardiovascular- S1, S2 normal, pulses palpable throughout   Lungs- chest clear, no wheezing, rales, normal symmetric air entry Abdomen- normal findings: bowel sounds normal Extremities- no edema Lymph-no adenopathy palpable Musculoskeletal-no joint tenderness, deformity or swelling Skin-warm and dry, no hyperpigmentation, vitiligo, or suspicious lesions  Neurological Examination Mental Status: Alert, intubated, able to follow verbal commands but at times it will take multiple times to get her to follow the command.  Will hold up two fingers, squeeze my hands, lift extremities and count fingers to command. Cranial Nerves: II: Discs flat bilaterally; Visual  fields grossly normal--blinks to threat bilaterally and counts fingers.--at times perseverated on the number of fingers previously held, pupils equal, round, reactive to light and accommodation  III,IV, VI: ptosis not present, extra-ocular motions intact bilaterally V,VII: face symmetric, facial light touch sensation normal bilaterally VIII: hearing normal bilaterally  Motor: Moving all extremities with 4/5 strength both spontaneously and purposefully Sensory: Pinprick and light touch intact throughout, bilaterally Deep Tendon Reflexes: 2+ and symmetric throughout Plantars: Right: downgoing   Left: downgoing Cerebellar: normal finger-to-nose --requires much coaching, could not do H-S due to not understanding what was asked of her Gait: not assessed      Lab Results: Basic Metabolic Panel:  Recent Labs Lab 07/06/14 0930 07/06/14 2345 07/07/14 0128 07/07/14 0325 07/08/14 0430  NA 130* 121* 127* 126* 126*  K 3.8 3.6 3.1* 3.3* 3.4*  CL 94* 85* 88* 88* 90*  CO2 27 25 27 26 25   GLUCOSE 103* 129* 120* 127* 96  BUN 7 6 <5* 6 14  CREATININE 0.61 0.48 0.53 0.72 0.66  CALCIUM 8.7* 8.8* 8.5* 8.1* 7.9*  MG  --   --   --   --  1.5*  PHOS  --   --   --   --  2.2*    Liver Function Tests:  Recent Labs Lab 07/06/14 0930 07/06/14 2345 07/07/14 0128 07/08/14 0430  AST 22 31 33 25  ALT 18 21 19 15   ALKPHOS 49 54 56 39  BILITOT 0.6 1.0 0.6 0.6  PROT 6.3* 6.7 6.5 4.9*  ALBUMIN 3.7 3.8 3.6 2.5*   No results for input(s): LIPASE, AMYLASE in the last 168 hours.  Recent Labs Lab 07/07/14 0128  AMMONIA 26    CBC:  Recent Labs Lab 07/06/14 0930 07/06/14 2345 07/07/14 0325 07/08/14 0430  WBC 11.8* 14.8* 15.0* 11.2*  NEUTROABS 9.7* 13.0*  --  9.6*  HGB 13.2 15.2* 14.9 12.8  HCT 40.0 45.4 44.6 38.1  MCV 92.0 89.4 91.0 88.2  PLT 191 175 190 161    Cardiac Enzymes: No results for input(s): CKTOTAL, CKMB, CKMBINDEX, TROPONINI in the last 168 hours.  Lipid  Panel:  Recent Labs Lab 07/07/14 1026  TRIG 99    CBG:  Recent Labs Lab 07/07/14 1812 07/07/14 2055 07/07/14 2319 07/08/14 0431 07/08/14 0742  GLUCAP 89 81 90 107* 116*    Microbiology: Results for orders placed or performed during the hospital encounter of 07/06/14  Urine culture     Status: None   Collection Time: 07/06/14 11:16 AM  Result Value Ref Range Status   Specimen Description URINE, CLEAN CATCH  Final   Special Requests NONE  Final   Colony Count NO GROWTH Performed at [facility]   Final   Culture NO GROWTH Performed at [facility]   Final   Report Status 07/07/2014 FINAL  Final  Culture, blood (routine x 2)     Status: None (Preliminary result)   Collection Time: 07/06/14 11:30 AM  Result Value Ref Range Status   Specimen Description BLOOD RIGHT ANTECUBITAL  Final   Special Requests BOTTLES DRAWN AEROBIC AND ANAEROBIC 10CC  Final   Culture   Final           BLOOD CULTURE RECEIVED NO GROWTH TO DATE CULTURE WILL BE HELD FOR 5 DAYS BEFORE ISSUING A FINAL NEGATIVE REPORT Performed at [facility]    Report Status PENDING  Incomplete  Culture, blood (routine x 2)     Status: None (Preliminary result)   Collection Time: 07/06/14 11:40 AM  Result Value Ref Range Status   Specimen Description BLOOD RIGHT ARM  Final   Special Requests  BOTTLES DRAWN AEROBIC AND ANAEROBIC 10CC  Final   Culture   Final           BLOOD CULTURE RECEIVED NO GROWTH TO DATE CULTURE WILL BE HELD FOR 5 DAYS BEFORE ISSUING A FINAL NEGATIVE REPORT Note: Culture results may be compromised due to an excessive volume of blood received in culture bottles. Performed at [facility]    Report Status PENDING  Incomplete  MRSA PCR Screening     Status: None   Collection Time: 07/06/14 11:51 PM  Result Value Ref Range Status   MRSA by PCR NEGATIVE NEGATIVE Final    Comment:        The GeneXpert MRSA Assay (FDA approved for NASAL specimens only), is one  component of a comprehensive MRSA colonization surveillance program. It is not intended to diagnose MRSA infection nor to guide or monitor treatment for MRSA infections.   Culture, respiratory (NON-Expectorated)     Status: None (Preliminary result)   Collection Time: 07/07/14 12:58 PM  Result Value Ref Range Status   Specimen Description TRACHEAL ASPIRATE  Final   Special Requests NONE  Final   Gram Stain   Final    NO WBC SEEN RARE SQUAMOUS EPITHELIAL CELLS PRESENT NO ORGANISMS SEEN FEW YEAST Performed at [facility]    Culture   Final    Culture reincubated for better growth Performed at [facility]    Report Status PENDING  Incomplete  Gram stain     Status: None   Collection Time: 07/07/14  8:15 PM  Result Value Ref Range Status   Specimen Description CSF  Final   Special Requests NONE  Final   Gram Stain   Final    CYTOSPUN WBC PRESENT,BOTH PMN AND MONONUCLEAR NO ORGANISMS SEEN    Report Status 07/07/2014 FINAL  Final  CSF culture     Status: None (Preliminary result)   Collection Time: 07/07/14  8:15 PM  Result Value Ref Range Status   Specimen Description CSF  Final   Special Requests TUBE 2  Final   Gram Stain   Final    CYTOSPIN SLIDE WBC PRESENT,BOTH PMN AND MONONUCLEAR NO ORGANISMS SEEN Performed at [facility] Performed at [facility]    Culture PENDING  Incomplete   Report Status PENDING  Incomplete    Coagulation Studies:  Recent Labs  07/07/14 1910  LABPROT 15.3*  INR 1.20    Imaging: Dg Chest 1 View  07/07/2014   CLINICAL DATA:  Intubation  EXAM: CHEST  1 VIEW  COMPARISON:  Portable exam 1104 hours compared to 07/06/2014  FINDINGS: LEFT jugular central venous catheter tip projects over SVC.  Nasogastric tube extends into stomach.  Tip of endotracheal tube projects approximately 4.3 cm above carina.  Normal heart size and mediastinal contours.  Pulmonary vascular congestion.  Scattered accentuation of  interstitial markings slightly greater on RIGHT than LEFT question pulmonary edema.  No pleural effusion or pneumothorax.  Bones unremarkable.  IMPRESSION: Suspect mild pulmonary edema.  Line and tube positions as above.   Electronically Signed   By: Ulyses Southward M.D.   On: 07/07/2014 11:55   Ct Head Wo Contrast  07/07/2014   CLINICAL DATA:  Headache and altered mental status.  EXAM: CT HEAD WITHOUT CONTRAST  TECHNIQUE: Contiguous axial images were obtained from the base of the skull through the vertex without intravenous contrast.  COMPARISON:  05/31/2014  FINDINGS: Suggestion of decreased density involving the posterior occipital and  to lesser extent parietal lobes. No intracranial hemorrhage, mass effect, or midline shift. No hydrocephalus. The basilar cisterns are patent. No evidence of territorial infarct. No intracranial fluid collection. Calvarium is intact. Included paranasal sinuses and mastoid air cells are well aerated.  IMPRESSION: Suggestion of decreased density involving the posterior occipital and to a lesser extent parietal lobes in a symmetric fashion. This can be seen in the setting of PRES (posterior reversible encephalopathy syndrome) MRI could be considered for further evaluation based on clinical concern.   Electronically Signed   By: Rubye Oaks M.D.   On: 07/07/2014 05:42   Mr Brain Wo Contrast  07/07/2014   CLINICAL DATA:  Fever. Worsening confusion. Chronic back pain with recent spinal cord stimulator removal.  EXAM: MRI HEAD WITHOUT CONTRAST  TECHNIQUE: Multiplanar, multiecho pulse sequences of the brain and surrounding structures were obtained without intravenous contrast.  COMPARISON:  Head CT 07/07/2014  FINDINGS: There is no evidence of acute infarct, intracranial hemorrhage, mass, midline shift, or extra-axial fluid collection. Ventricles and sulci are normal. Small foci of T2 hyperintensity throughout the subcortical and deep cerebral white matter bilaterally and patchy T2  hyperintensity in the pons are nonspecific but compatible with mild chronic small vessel ischemic disease. A dilated perivascular space is noted inferiorly in the right basal ganglia.  Prior bilateral cataract extraction is noted. Mild mucosal thickening is noted in the ethmoid air cells and maxillary sinuses bilaterally, and there is a trace right mastoid effusion. Moderate-sized joint effusions are noted at the left atlanto-occipital and left atlantoaxial articulations. Major intracranial vascular flow voids are preserved. Endotracheal and enteric tubes are partially visualized, and there is fluid in the nasopharynx and oropharynx.  IMPRESSION: 1. No evidence of acute intracranial abnormality. 2. Mild chronic small vessel ischemic disease.   Electronically Signed   By: Sebastian Ache   On: 07/07/2014 18:13   Mr Thoracic Spine W Wo Contrast  07/07/2014   ADDENDUM REPORT: 07/07/2014 19:09  ADDENDUM: These results were called by telephone at the time of interpretation on 07/07/2014 at 6:55 p.m. to Dr. Rory Percy, who verbally acknowledged these results.   Electronically Signed   By: Sebastian Ache   On: 07/07/2014 19:09   07/07/2014   CLINICAL DATA:  Fever. Worsening confusion. Chronic back pain with recent spinal cord stimulator removal.  EXAM: MRI THORACIC AND LUMBAR SPINE WITHOUT AND WITH CONTRAST  TECHNIQUE: Multiplanar and multiecho pulse sequences of the thoracic and lumbar spine were obtained without and with intravenous contrast.  CONTRAST:  15mL MULTIHANCE GADOBENATE DIMEGLUMINE 529 MG/ML IV SOLN  COMPARISON:  Lumbar spine MRI 03/25/2010  FINDINGS: MR THORACIC SPINE FINDINGS  Vertebral alignment is within normal limits. There is a very mild T4 superior endplate compression fracture with minimal edema along superior endplate. Posterior thoracolumbar fusion hardware is partially visualized extending from T10 caudally. The thoracic spinal cord is normal in caliber and signal. No epidural fluid collection  is identified. No abnormal enhancement is identified.  At T6-7, there is a small left paracentral disc protrusion without stenosis. At T7-8, there is mild disc bulging without stenosis. At T8-9, there is disc bulging with superimposed right posterior lateral disc protrusion which partially effaces the ventral thecal sac but does not result significant spinal stenosis or mass effect on the spinal cord. There is mild left neural foraminal narrowing at T8-9 due to a foraminal disc protrusion. At T9-10, there is a small central disc protrusion without stenosis.  No paraspinal fluid collection is seen. An  enteric tube is partially visualized, and there is fluid in the thoracic esophagus. Patchy foci of abnormal signal in the lower lobes of the lungs may represent mild pulmonary edema as suspected on recent radiographs.  MR LUMBAR SPINE FINDINGS  Slight retrolisthesis of L1 on L2 is unchanged. There is also trace anterolisthesis of L3 on L4. Sequelae of posterior fusion are identified from T11-S1, extended both cranially and caudally since the prior lumbar spine MRI. A fusions screw is partially visualized in the right ilium. Interbody spacers are present at L2-3 and L5-S1. The fusion hardware results in prominent susceptibility artifact which limits evaluation.  Lumbar vertebral body heights are preserved. Fluid collection dorsal to the spinal canal is suboptimally visualized due to susceptibility artifact but appears to extend from the lower thoracic spine caudally to the L4 level, where it measures 1.4 cm in AP thickness. The conus medullaris is obscured by artifact.  A small central disc protrusion at L1-2 appear slightly less prominent than on the prior study. No lumbar spinal stenosis or neural foraminal stenosis is seen, however evaluation is limited by artifact. No abnormal enhancement is identified.  IMPRESSION: 1. Very mild T4 superior endplate compression fracture, likely subacute to chronic. 2. Mild disc  degeneration in the mid and lower thoracic spine without stenosis. 3. Limited evaluation of the lumbar spine due to artifact from extensive posterior fusion. Dorsal epidural fluid collection at the operative levels may represent a postoperative seroma or pseudomeningocele, although infection cannot be excluded by imaging. These results will be called to the ordering clinician or representative by the Radiologist Assistant, and communication documented in the PACS or zVision Dashboard.  Electronically Signed: By: Sebastian Ache On: 07/07/2014 18:41   Mr Lumbar Spine W Wo Contrast  07/07/2014   ADDENDUM REPORT: 07/07/2014 19:09  ADDENDUM: These results were called by telephone at the time of interpretation on 07/07/2014 at 6:55 p.m. to Dr. Rory Percy, who verbally acknowledged these results.   Electronically Signed   By: Sebastian Ache   On: 07/07/2014 19:09   07/07/2014   CLINICAL DATA:  Fever. Worsening confusion. Chronic back pain with recent spinal cord stimulator removal.  EXAM: MRI THORACIC AND LUMBAR SPINE WITHOUT AND WITH CONTRAST  TECHNIQUE: Multiplanar and multiecho pulse sequences of the thoracic and lumbar spine were obtained without and with intravenous contrast.  CONTRAST:  15mL MULTIHANCE GADOBENATE DIMEGLUMINE 529 MG/ML IV SOLN  COMPARISON:  Lumbar spine MRI 03/25/2010  FINDINGS: MR THORACIC SPINE FINDINGS  Vertebral alignment is within normal limits. There is a very mild T4 superior endplate compression fracture with minimal edema along superior endplate. Posterior thoracolumbar fusion hardware is partially visualized extending from T10 caudally. The thoracic spinal cord is normal in caliber and signal. No epidural fluid collection is identified. No abnormal enhancement is identified.  At T6-7, there is a small left paracentral disc protrusion without stenosis. At T7-8, there is mild disc bulging without stenosis. At T8-9, there is disc bulging with superimposed right posterior lateral disc  protrusion which partially effaces the ventral thecal sac but does not result significant spinal stenosis or mass effect on the spinal cord. There is mild left neural foraminal narrowing at T8-9 due to a foraminal disc protrusion. At T9-10, there is a small central disc protrusion without stenosis.  No paraspinal fluid collection is seen. An enteric tube is partially visualized, and there is fluid in the thoracic esophagus. Patchy foci of abnormal signal in the lower lobes of the lungs may represent mild pulmonary  edema as suspected on recent radiographs.  MR LUMBAR SPINE FINDINGS  Slight retrolisthesis of L1 on L2 is unchanged. There is also trace anterolisthesis of L3 on L4. Sequelae of posterior fusion are identified from T11-S1, extended both cranially and caudally since the prior lumbar spine MRI. A fusions screw is partially visualized in the right ilium. Interbody spacers are present at L2-3 and L5-S1. The fusion hardware results in prominent susceptibility artifact which limits evaluation.  Lumbar vertebral body heights are preserved. Fluid collection dorsal to the spinal canal is suboptimally visualized due to susceptibility artifact but appears to extend from the lower thoracic spine caudally to the L4 level, where it measures 1.4 cm in AP thickness. The conus medullaris is obscured by artifact.  A small central disc protrusion at L1-2 appear slightly less prominent than on the prior study. No lumbar spinal stenosis or neural foraminal stenosis is seen, however evaluation is limited by artifact. No abnormal enhancement is identified.  IMPRESSION: 1. Very mild T4 superior endplate compression fracture, likely subacute to chronic. 2. Mild disc degeneration in the mid and lower thoracic spine without stenosis. 3. Limited evaluation of the lumbar spine due to artifact from extensive posterior fusion. Dorsal epidural fluid collection at the operative levels may represent a postoperative seroma or  pseudomeningocele, although infection cannot be excluded by imaging. These results will be called to the ordering clinician or representative by the Radiologist Assistant, and communication documented in the PACS or zVision Dashboard.  Electronically Signed: By: Sebastian Ache On: 07/07/2014 18:41   Dg Chest Port 1 View  07/08/2014   CLINICAL DATA:  Respiratory failure, history of COPD, hypertension  EXAM: PORTABLE CHEST - 1 VIEW  COMPARISON:  Portable chest x-ray of Jul 07, 2014  FINDINGS: The lungs remain hyperinflated. The pulmonary interstitial markings have improved. The cardiac silhouette is normal in size. The central pulmonary vascularity is less engorged. There is no significant pleural effusion and there is no pneumothorax.  The endotracheal tube tip lies 5.4 cm above the carina. The esophagogastric tube tip projects below the inferior margin of the image. The left internal jugular venous catheter tip projects over the midportion of the SVC. There are Harrington rods extending from the lower thoracic spine inferiorly. No acute bony abnormality is observed.  IMPRESSION: COPD with improving pulmonary edema. There is no pneumonia nor significant pleural effusion.   Electronically Signed   By: David  [place] M.D.   On: 07/08/2014 07:47   Dg Chest Port 1 View  07/06/2014   CLINICAL DATA:  Acute onset of chest congestion and shortness of breath. Initial encounter.  EXAM: PORTABLE CHEST - 1 VIEW  COMPARISON:  Chest radiograph performed earlier today at 9:26 a.m.  FINDINGS: The lungs are well-aerated. Vascular congestion is noted, with increased interstitial markings, concerning for mild interstitial edema. No pleural effusion or pneumothorax is seen. There is no evidence of pleural effusion or pneumothorax.  The cardiomediastinal silhouette is borderline normal in size. No acute osseous abnormalities are seen.  IMPRESSION: Vascular congestion, with increased interstitial markings, concerning for mild  interstitial edema.   Electronically Signed   By: Roanna Raider M.D.   On: 07/06/2014 23:25    Felicie Morn PA-C [facility] [phone]  07/08/2014, 10:22 AM   Assessment/Plan: 64 YO female with encephalopathy.  Per nurse she has improved significantly today.  On exam she is following simple commands (at times has difficulty), showing no focal deficit. As stated above she is febrile from unknown source--currently on  Abx and  Hyponatremic.  Encephalopathy likely seondary to metabolic/infectious etiology. Narcotic withdrawal could also be playing a significant role if she was not taking he usual narcotic.  EEG pending.  Will follow along.   I personally participated in this patient's evaluation and management, including formulating the above clinical impression and management recommendations.  Venetia MaxonR Franciscojavier Wronski M.D. [facility] [phone]

## 2014-07-08 NOTE — Progress Notes (Signed)
eLink Physician-Brief Progress Note Patient Name: Daisy FolksRebecca E Lemanski DOB: [birth year] MRN: [id number]   Date of Service  07/08/2014  HPI/Events of Note  K+ = 3.4, Phosphorus = 2.2, Magnesium = 1.5 and Creatinine = 0.66.  eICU Interventions  Will replete Magnesium, K+ and Phosphorus.     Intervention Category Intermediate Interventions: Electrolyte abnormality - evaluation and management  Shonta Bourque Eugene 07/08/2014, 6:53 AM

## 2014-07-08 NOTE — Progress Notes (Deleted)
[facility] Electrolyte Replacement Protocol  Patient Name: [name] DOB: 1950-08-22 MRN: [id number]  Date of Service  07/08/2014   HPI/Events of Note    Recent Labs Lab 07/06/14 0930 07/06/14 2345 07/07/14 0128 07/07/14 0325 07/08/14 0430  NA 130* 121* 127* 126* 126*  K 3.8 3.6 3.1* 3.3* 3.4*  CL 94* 85* 88* 88* 90*  CO2 27 25 27 26 25   GLUCOSE 103* 129* 120* 127* 96  BUN 7 6 <5* 6 14  CREATININE 0.61 0.48 0.53 0.72 0.66  CALCIUM 8.7* 8.8* 8.5* 8.1* 7.9*  MG  --   --   --   --  1.5*  PHOS  --   --   --   --  2.2*    Estimated Creatinine Clearance: 73.9 mL/min (by C-G formula based on Cr of 0.66).  Intake/Output      05/31 0701 - 06/01 0700   P.O. 0   I.V. (mL/kg) 635.5 (8.5)   IV Piggyback 2315   Total Intake(mL/kg) 2950.5 (39.6)   Urine (mL/kg/hr) 1415 (0.8)   Stool 0 (0)   Total Output 1415   Net +1535.5       Stool Occurrence 4 x    - I/O DETAILED x24h    Total I/O In: 535.2 [I.V.:170.2; IV Piggyback:365] Out: 1060 [Urine:1060] - I/O THIS SHIFT    ASSESSMENT   eICURN Interventions  ICU Electrolyte Replacement Protocol criteria met. Labs Replaced per protocol. MD notified   ASSESSMENT: MAJOR ELECTROLYTE    Lorene Dy 07/08/2014, 6:41 AM

## 2014-07-08 NOTE — Progress Notes (Signed)
EEG completed; results pending.    

## 2014-07-08 NOTE — Progress Notes (Signed)
Pt with increasing agitation, tachycardic, hypertensive, and fighting vent. Rass score +2. E-link MD paged. Received order for one time dose fentanyl for pain. Will continue to monitor.

## 2014-07-08 NOTE — Procedures (Signed)
EEG report.  Brief clinical history:  64 y.o. female with past medical history of COPD, chronic respiratory failure on 2 L nasal cannula at baseline(noncompliant with it), still smoking, chronic back pain secondary to multiple lumbar surgeries in the past, Hypertension, hyperlipidemia, [facility] secondary to acute encephalopathy secondary to aspiration pneumonia , and excessive lethargy due to narcotics , patient was brought by her husband and given worsening mental status this a.m. , husband report since recent discharge and adjustment of pain medication where her OxyContin and oxycodone has been decreased he has been in charge of giving her pain medications , medications bottles with him , reports in the morning his wife was drooling, confused, hard to arouse, in ED workup was significant for fever of 101, mild leukocytosis at 11.8, negative urinalysis, negative chest x-ray, urine drug screen was negative (even though husband confirmed he was giving her pain medicine as scheduled ), ABG significant for mild respiratory acidosis with PCO2 of 52 which is her baseline.  Technique: this is a 17 channel routine scalp EEG performed at the bedside in the ICU, with bipolar and monopolar montages arranged in accordance to the international 10/20 system of electrode placement. One channel was dedicated to EKG recording.  The patient is sedated, intubated on the vent. No activating procedures performed.  Description: as the study opens and throughout the entire recording, there is generalized, high amplitude, continuous, non evolving theta frequencies that at times are intermixed with delta activity over the anterior head regions and diffuse triphasic waves. No focal or generalized epileptiform discharges noted.  EKG showed sinus rhythm.  Impression: this is an abnormal EEG that demonstrated findings consistent with a moderately severe encephalopathy, no specific as to cause. No  evidence of electrographic seizures noted. Clinical correlation is advised.   Wyatt Portelasvaldo Camilo, MD [facility]

## 2014-07-08 NOTE — Progress Notes (Signed)
Patient has made improvement throughout the day. Patient more alert. Able to follow commands. Also able to write partial messages on the white board.  Moves all extremities. Equal strength throughout.  BP stable. Low grade temp.    BP 106/58 mmHg  Pulse 102  Temp(Src) 100.1 F (37.8 C) (Oral)  Resp 15  Ht 5\' 7"  (1.702 m)  Wt 74.5 kg (164 lb 3.9 oz)  BMI 25.72 kg/m2  SpO2 100%

## 2014-07-08 NOTE — Progress Notes (Signed)
Daisy FolksRebecca E Pham is a 64 y.o. female patient.   Blood pressure 100/51, pulse 123, temperature 101.1 F (38.4 C), temperature source Oral, resp. rate 30, height 5\' 7"  (1.702 m), weight 74.5 kg (164 lb 3.9 oz), SpO2 100 %.  Subjective Objective: Patient intermittently following commands, however there is a delay in reaction. You have to ask patient multiple times before getting a response.  Responds better to husband's voice. Patient was able to open eyes on command and to lift arms and squeeze hands on commands. Shook head "no" when asked was she in pain.  Assessment & Plan: Per MD orders will start tube feeds today. K-Phosphate infusing. 2gm of Mag given. Patient on fentanly drip @ 25 mcg. Will continue to monitor temps and BP. Will enforce q2h turns and ROM exercises.   Daisy PaganiniCURRY, Fredy Gladu R, RN   07/08/2014

## 2014-07-08 NOTE — Progress Notes (Signed)
Initial Nutrition Assessment  INTERVENTION:  Initiate Osmolite 1.2 @20  and advance by 10 ml every 4 hours to goal rate of 60 ml/hr, provides 1728 kcal (99% of est needs), 80 g protein (100% est needs) and 1181 ml of free water.   NUTRITION DIAGNOSIS:  Inadequate oral intake related to inability to eat as evidenced by NPO status.  GOAL:  Patient will meet greater than or equal to 90% of their needs  MONITOR:  TF tolerance, Diet advancement, Vent status, Labs, Weight trends, Skin, I & O's  REASON FOR ASSESSMENT:  Consult Enteral/tube feeding initiation and management  ASSESSMENT: 64 y.o. female with past medical history of COPD, chronic respiratory failure on 2 L nasal cannula at baseline (noncompliant with it), still smoking, chronic back pain secondary to multiple lumbar surgeries in the past, Hypertension, hyperlipidemia. [facility] secondary to acute encephalopathy secondary to aspiration pneumonia.   Pt is currently intubated on ventilator support. Per RN she is doing somewhat better today.  Spoke with husband on the phone, who states his wife has been loosing weight progressively over last several years, and does not feel that it accelerated recently. Per husband she was proud of the wt loss (she went from size 16 to size 10 in 2-3 yrs). Pt is a picky eater and just picks things of the plate, eating often less that 1/2 of the food, pt and husband eat out a lot. Per husband pt always says she fills up fast. Pt does drink a lot of coffee and loves to cook, home cooking includes a lot of vegetables and salads, lean protein. Pt's husband unable to state her usual weight as pt was always secretive about it. TF started this morning per protocol, running Vital HP @20 . Will order Osmolite 1.2 to better meet pt's calorie and protein needs.  Will continue to monitor. MV: 14.5 Propofol: none Temp (24hrs), Avg:100.6 F (38.1 C), Min:99.2 F (37.3 C),  Max:101.5 F (38.6 C)   Height:  Ht Readings from Last 1 Encounters:  07/06/14 5\' 7"  (1.702 m)    Weight:  Wt Readings from Last 1 Encounters:  07/06/14 164 lb 3.9 oz (74.5 kg)    Ideal Body Weight:  61 kg  Wt Readings from Last 10 Encounters:  07/06/14 164 lb 3.9 oz (74.5 kg)  06/02/14 157 lb 6.5 oz (71.4 kg)  04/16/14 178 lb 3.2 oz (80.831 kg)  04/09/14 178 lb (80.74 kg)  04/02/14 171 lb (77.565 kg)  03/27/14 172 lb (78.019 kg)  02/13/12 185 lb (83.915 kg)  01/10/12 175 lb (79.379 kg)  10/28/11 218 lb 12.8 oz (99.247 kg)    BMI:  Body mass index is 25.72 kg/(m^2).  Estimated Nutritional Needs:  Kcal:  1740  Protein:  75 - 90 g  Fluid:  2.0 L  Skin:  Wound (see comment) (Stage I pressure ulcer on coccyx)  Diet Order:  Diet NPO time specified  EDUCATION NEEDS:  Education needs no appropriate at this time   Intake/Output Summary (Last 24 hours) at 07/08/14 1442 Last data filed at 07/08/14 0818  Gross per 24 hour  Intake 2399.02 ml  Output   1310 ml  Net 1089.02 ml    Last BM:  5/31  Marlissa Emerick A. [facility] Intern Pager: [phone] 07/08/2014 4:24 PM

## 2014-07-08 NOTE — Progress Notes (Signed)
eLink Physician-Brief Progress Note Patient Name: Daisy FolksRebecca E Pham DOB: [birth year] MRN: [id number]   Date of Service  07/08/2014  HPI/Events of Note  Hypotension/ BP = 88/37. Propofol IV infusion rate decreased.   eICU Interventions  Will bolus with 0.9 NaCl 1 liter IV over 1 hour now.      Intervention Category Intermediate Interventions: Hypotension - evaluation and management  Jacalyn Biggs Eugene 07/08/2014, 4:23 AM

## 2014-07-08 NOTE — Progress Notes (Signed)
eLink Physician-Brief Progress Note Patient Name: Daisy FolksRebecca E Westergren DOB: Aug 07, 1950 MRN: [id number]   Date of Service  07/08/2014  HPI/Events of Note  Agitation. Propofol IV infusion now maxed at 50 mcg/kg/min.  eICU Interventions  Will order Fentanyl 50 mcg IV Q 2 hours PRN.     Intervention Category Minor Interventions: Agitation / anxiety - evaluation and management  Cortlyn Cannell Eugene 07/08/2014, 1:05 AM

## 2014-07-08 NOTE — Progress Notes (Signed)
PULMONARY / CRITICAL CARE MEDICINE   Name: [name] MRN: [id number] DOB: [birth year]    ADMISSION DATE:  07/06/2014 CONSULTATION DATE:  07/08/2014  REFERRING MD :  TRH  CHIEF COMPLAINT:  AMS / agitation / tachypnea  INITIAL PRESENTATION:  64 y.o. F with chronic pain brought to [facility] ED 5/30 due to AMS.  She was admitted by [facility] and later that night became hypoxic, tachypneic and restless.  She was started on BiPAP and transferred to SDU.  PCCM was consulted for admission.   STUDIES:  CXR 5/30 >>> vascular congestion. CT 5/31>>>concern press Mri brain 5/31>>>No evidence of acute intracranial abnormality Mild chronic small vessel ischemic disease MRI thoracic / lumbar>>>Very mild T4 superior endplate compression fracture, likely subacute to chronic.Mild disc degeneration in the mid and lower thoracic spine without stenosis.Limited evaluation of the lumbar spine due to artifact from extensive posterior fusion. Dorsal epidural fluid collection at the operative levels may represent a postoperative seroma or pseudomeningocele, LP 5/31>>>high opening pressure,   SIGNIFICANT EVENTS: 5/30 - admitted with AMS.  PCCM consulted later for agitation and tachypnea. 5/31- intubated, LP, MRI, fever, empiric acyclovir  SUBJECTIVE: lower fever  VITAL SIGNS: Temp:  [99.2 F (37.3 C)-101.5 F (38.6 C)] 99.8 F (37.7 C) (06/01 0400) Pulse Rate:  [33-148] 122 (06/01 0319) Resp:  [16-34] 34 (06/01 0319) BP: (70-244)/(33-221) 120/49 mmHg (06/01 0319) SpO2:  [91 %-100 %] 100 % (06/01 0319) FiO2 (%):  [40 %-50 %] 40 % (06/01 0319) HEMODYNAMICS: CVP:  [2 mmHg] 2 mmHg VENTILATOR SETTINGS: Vent Mode:  [-] PRVC FiO2 (%):  [40 %-50 %] 40 % Set Rate:  [16 bmp-30 bmp] 30 bmp Vt Set:  [460 mL] 460 mL PEEP:  [5 cmH20] 5 cmH20 Plateau Pressure:  [16 cmH20-29 cmH20] 23 cmH20 INTAKE / OUTPUT: Intake/Output      05/31 0701 - 06/01 0700   P.O. 0   I.V. (mL/kg) 635.5 (8.5)   IV Piggyback 2315   Total  Intake(mL/kg) 2950.5 (39.6)   Urine (mL/kg/hr) 1415 (0.8)   Stool 0 (0)   Total Output 1415   Net +1535.5       Stool Occurrence 4 x     PHYSICAL EXAMINATION: General: WDWN female, in NAD. Neuro: rass 1 currently, perr, moves all ext HEENT: ett, line clean Cardiovascular: Tachy, regular, no M/R/G.  Lungs: coarse Abdomen: BS x 4, soft, NT/ND.  Musculoskeletal: No gross deformities, no edema.  Skin: Intact, warm, no rashes.  LABS:  CBC  Recent Labs Lab 07/06/14 0930 07/06/14 2345 07/07/14 0325  WBC 11.8* 14.8* 15.0*  HGB 13.2 15.2* 14.9  HCT 40.0 45.4 44.6  PLT 191 175 190   Coag's  Recent Labs Lab 07/07/14 1910  APTT 33  INR 1.20   BMET  Recent Labs Lab 07/06/14 2345 07/07/14 0128 07/07/14 0325  NA 121* 127* 126*  K 3.6 3.1* 3.3*  CL 85* 88* 88*  CO2 25 27 26   BUN 6 <5* 6  CREATININE 0.48 0.53 0.72  GLUCOSE 129* 120* 127*   Electrolytes  Recent Labs Lab 07/06/14 2345 07/07/14 0128 07/07/14 0325  CALCIUM 8.8* 8.5* 8.1*   Sepsis Markers  Recent Labs Lab 07/06/14 2345 07/07/14 0128 07/07/14 0152  LATICACIDVEN 0.9  --  0.8  PROCALCITON  --  0.35  --    ABG  Recent Labs Lab 07/06/14 2206 07/07/14 1304 07/08/14 0325  PHART 7.326* 7.220* 7.386  PCO2ART 58.4* 62.2* 42.2  PO2ART 71.6* 78.0* 189*   Liver Enzymes  Recent Labs Lab 07/06/14 0930 07/06/14 2345 07/07/14 0128  AST 22 31 33  ALT 18 21 19   ALKPHOS 49 54 56  BILITOT 0.6 1.0 0.6  ALBUMIN 3.7 3.8 3.6   Cardiac Enzymes No results for input(s): TROPONINI, PROBNP in the last 168 hours. Glucose  Recent Labs Lab 07/07/14 1142 07/07/14 1812 07/07/14 2055 07/07/14 2319  GLUCAP 101* 89 81 90    Imaging Dg Chest 1 View  07/07/2014   CLINICAL DATA:  Intubation  EXAM: CHEST  1 VIEW  COMPARISON:  Portable exam 1104 hours compared to 07/06/2014  FINDINGS: LEFT jugular central venous catheter tip projects over SVC.  Nasogastric tube extends into stomach.  Tip of  endotracheal tube projects approximately 4.3 cm above carina.  Normal heart size and mediastinal contours.  Pulmonary vascular congestion.  Scattered accentuation of interstitial markings slightly greater on RIGHT than LEFT question pulmonary edema.  No pleural effusion or pneumothorax.  Bones unremarkable.  IMPRESSION: Suspect mild pulmonary edema.  Line and tube positions as above.   Electronically Signed   By: Ulyses SouthwardMark  Boles M.D.   On: 07/07/2014 11:55   Mr Brain Wo Contrast  07/07/2014   CLINICAL DATA:  Fever. Worsening confusion. Chronic back pain with recent spinal cord stimulator removal.  EXAM: MRI HEAD WITHOUT CONTRAST  TECHNIQUE: Multiplanar, multiecho pulse sequences of the brain and surrounding structures were obtained without intravenous contrast.  COMPARISON:  Head CT 07/07/2014  FINDINGS: There is no evidence of acute infarct, intracranial hemorrhage, mass, midline shift, or extra-axial fluid collection. Ventricles and sulci are normal. Small foci of T2 hyperintensity throughout the subcortical and deep cerebral white matter bilaterally and patchy T2 hyperintensity in the pons are nonspecific but compatible with mild chronic small vessel ischemic disease. A dilated perivascular space is noted inferiorly in the right basal ganglia.  Prior bilateral cataract extraction is noted. Mild mucosal thickening is noted in the ethmoid air cells and maxillary sinuses bilaterally, and there is a trace right mastoid effusion. Moderate-sized joint effusions are noted at the left atlanto-occipital and left atlantoaxial articulations. Major intracranial vascular flow voids are preserved. Endotracheal and enteric tubes are partially visualized, and there is fluid in the nasopharynx and oropharynx.  IMPRESSION: 1. No evidence of acute intracranial abnormality. 2. Mild chronic small vessel ischemic disease.   Electronically Signed   By: Sebastian AcheAllen  Grady   On: 07/07/2014 18:13   Mr Thoracic Spine W Wo Contrast  07/07/2014    ADDENDUM REPORT: 07/07/2014 19:09  ADDENDUM: These results were called by telephone at the time of interpretation on 07/07/2014 at 6:55 p.m. to Dr. Rory PercyANIEL Parish Augustine, who verbally acknowledged these results.   Electronically Signed   By: Sebastian AcheAllen  Grady   On: 07/07/2014 19:09   07/07/2014   CLINICAL DATA:  Fever. Worsening confusion. Chronic back pain with recent spinal cord stimulator removal.  EXAM: MRI THORACIC AND LUMBAR SPINE WITHOUT AND WITH CONTRAST  TECHNIQUE: Multiplanar and multiecho pulse sequences of the thoracic and lumbar spine were obtained without and with intravenous contrast.  CONTRAST:  15mL MULTIHANCE GADOBENATE DIMEGLUMINE 529 MG/ML IV SOLN  COMPARISON:  Lumbar spine MRI 03/25/2010  FINDINGS: MR THORACIC SPINE FINDINGS  Vertebral alignment is within normal limits. There is a very mild T4 superior endplate compression fracture with minimal edema along superior endplate. Posterior thoracolumbar fusion hardware is partially visualized extending from T10 caudally. The thoracic spinal cord is normal in caliber and signal. No epidural fluid collection is identified. No abnormal enhancement is identified.  At T6-7, there is a small left paracentral disc protrusion without stenosis. At T7-8, there is mild disc bulging without stenosis. At T8-9, there is disc bulging with superimposed right posterior lateral disc protrusion which partially effaces the ventral thecal sac but does not result significant spinal stenosis or mass effect on the spinal cord. There is mild left neural foraminal narrowing at T8-9 due to a foraminal disc protrusion. At T9-10, there is a small central disc protrusion without stenosis.  No paraspinal fluid collection is seen. An enteric tube is partially visualized, and there is fluid in the thoracic esophagus. Patchy foci of abnormal signal in the lower lobes of the lungs may represent mild pulmonary edema as suspected on recent radiographs.  MR LUMBAR SPINE FINDINGS  Slight  retrolisthesis of L1 on L2 is unchanged. There is also trace anterolisthesis of L3 on L4. Sequelae of posterior fusion are identified from T11-S1, extended both cranially and caudally since the prior lumbar spine MRI. A fusions screw is partially visualized in the right ilium. Interbody spacers are present at L2-3 and L5-S1. The fusion hardware results in prominent susceptibility artifact which limits evaluation.  Lumbar vertebral body heights are preserved. Fluid collection dorsal to the spinal canal is suboptimally visualized due to susceptibility artifact but appears to extend from the lower thoracic spine caudally to the L4 level, where it measures 1.4 cm in AP thickness. The conus medullaris is obscured by artifact.  A small central disc protrusion at L1-2 appear slightly less prominent than on the prior study. No lumbar spinal stenosis or neural foraminal stenosis is seen, however evaluation is limited by artifact. No abnormal enhancement is identified.  IMPRESSION: 1. Very mild T4 superior endplate compression fracture, likely subacute to chronic. 2. Mild disc degeneration in the mid and lower thoracic spine without stenosis. 3. Limited evaluation of the lumbar spine due to artifact from extensive posterior fusion. Dorsal epidural fluid collection at the operative levels may represent a postoperative seroma or pseudomeningocele, although infection cannot be excluded by imaging. These results will be called to the ordering clinician or representative by the Radiologist Assistant, and communication documented in the PACS or zVision Dashboard.  Electronically Signed: By: Sebastian Ache On: 07/07/2014 18:41   Mr Lumbar Spine W Wo Contrast  07/07/2014   ADDENDUM REPORT: 07/07/2014 19:09  ADDENDUM: These results were called by telephone at the time of interpretation on 07/07/2014 at 6:55 p.m. to Dr. Rory Percy, who verbally acknowledged these results.   Electronically Signed   By: Sebastian Ache   On:  07/07/2014 19:09   07/07/2014   CLINICAL DATA:  Fever. Worsening confusion. Chronic back pain with recent spinal cord stimulator removal.  EXAM: MRI THORACIC AND LUMBAR SPINE WITHOUT AND WITH CONTRAST  TECHNIQUE: Multiplanar and multiecho pulse sequences of the thoracic and lumbar spine were obtained without and with intravenous contrast.  CONTRAST:  15mL MULTIHANCE GADOBENATE DIMEGLUMINE 529 MG/ML IV SOLN  COMPARISON:  Lumbar spine MRI 03/25/2010  FINDINGS: MR THORACIC SPINE FINDINGS  Vertebral alignment is within normal limits. There is a very mild T4 superior endplate compression fracture with minimal edema along superior endplate. Posterior thoracolumbar fusion hardware is partially visualized extending from T10 caudally. The thoracic spinal cord is normal in caliber and signal. No epidural fluid collection is identified. No abnormal enhancement is identified.  At T6-7, there is a small left paracentral disc protrusion without stenosis. At T7-8, there is mild disc bulging without stenosis. At T8-9, there is disc bulging with superimposed  right posterior lateral disc protrusion which partially effaces the ventral thecal sac but does not result significant spinal stenosis or mass effect on the spinal cord. There is mild left neural foraminal narrowing at T8-9 due to a foraminal disc protrusion. At T9-10, there is a small central disc protrusion without stenosis.  No paraspinal fluid collection is seen. An enteric tube is partially visualized, and there is fluid in the thoracic esophagus. Patchy foci of abnormal signal in the lower lobes of the lungs may represent mild pulmonary edema as suspected on recent radiographs.  MR LUMBAR SPINE FINDINGS  Slight retrolisthesis of L1 on L2 is unchanged. There is also trace anterolisthesis of L3 on L4. Sequelae of posterior fusion are identified from T11-S1, extended both cranially and caudally since the prior lumbar spine MRI. A fusions screw is partially visualized in the  right ilium. Interbody spacers are present at L2-3 and L5-S1. The fusion hardware results in prominent susceptibility artifact which limits evaluation.  Lumbar vertebral body heights are preserved. Fluid collection dorsal to the spinal canal is suboptimally visualized due to susceptibility artifact but appears to extend from the lower thoracic spine caudally to the L4 level, where it measures 1.4 cm in AP thickness. The conus medullaris is obscured by artifact.  A small central disc protrusion at L1-2 appear slightly less prominent than on the prior study. No lumbar spinal stenosis or neural foraminal stenosis is seen, however evaluation is limited by artifact. No abnormal enhancement is identified.  IMPRESSION: 1. Very mild T4 superior endplate compression fracture, likely subacute to chronic. 2. Mild disc degeneration in the mid and lower thoracic spine without stenosis. 3. Limited evaluation of the lumbar spine due to artifact from extensive posterior fusion. Dorsal epidural fluid collection at the operative levels may represent a postoperative seroma or pseudomeningocele, although infection cannot be excluded by imaging. These results will be called to the ordering clinician or representative by the Radiologist Assistant, and communication documented in the PACS or zVision Dashboard.  Electronically Signed: By: Sebastian Ache On: 07/07/2014 18:41     ASSESSMENT / PLAN:  NEUROLOGIC A:   Acute encephalopathy - unclear etiology as this point ? Opiate withdrawal - husband reports pt religiously takes her pain meds; however, UDS negative and narcan in ED with no effect Intermittent agitation Chronic pain syndrome Vent dyschrony LP with high opening pressure  P:   MRi reported as post op changes, Lp performed, traumatic cleared, I ordered cell count dif in tube 4 also, will cal lab to perform this, follow culture, unimpressed infection at this stage wnl TSH, folate, B12 (pending) Hold oxycodone,  ropinirole. Add fent drip, dc prop all together as drop in BP labile Remain int versed, may need drip Add rass goal -1 See ID  PULMONARY A: Acute on chronic hypoxemic and hypercarbic respiratory failure COPD without evidence of exacerbation Tobacco use disorder pulm edema likely  P:   Obtain cvp Lasix to neg balance abg reviewed, keep same MV pcxr in am  Unable to wean with too high MV, abg again in afternoon    CARDIOVASCULAR A:  Hypotension following initiation of prop Hypertension - received hydralazine and lopressor  Sinus tach - likely due to fever P:  Dc prop as BP drop Hydralazine PRN Lasix Echo assessment reviewed done April 25th Get cvp  RENAL A:   Hyponatremia Pseudoypocalcemia hypoK P:   bmet awaited kvo Send serum osm, may need further work up Na lasix  GASTROINTESTINAL A:  Nutrition P:  .  Start TF ppi  HEMATOLOGIC A:   VTE Prophylaxis Leukocytosis improved P:  SCD's / Heparin. CBC in AM coags wnl  INFECTIOUS A:   Fever with mild leukocytosis - though no clear evidence of infection thus far R.o encephalitis P:   BCx2 5/30 > UCx 5/30 > LP 5/31>>> Abx:  Vanc, start date 5/30, day 2/x. Abx: Cefepime, start date 5/30, day 2/x. Acyclovir 5/31>>>  Keep BB coverage Follow hsv pcr Keep empiric treatrment fo rnow Follow cell count and diff tube 4  ENDOCRINE A:   No acute issues  P:   No interventions required.  Family updated: Husband at bedside 5/31  Interdisciplinary Family Meeting v Palliative Care Meeting:  Due by: 07/13/14.  Ccm time 30 min  Mcarthur Rossetti. Tyson Alias, MD, FACP Pgr: [phone] [facility] 07/08/2014 5:39 AM

## 2014-07-09 ENCOUNTER — Inpatient Hospital Stay (HOSPITAL_COMMUNITY): Payer: Medicare Other

## 2014-07-09 ENCOUNTER — Encounter (HOSPITAL_COMMUNITY): Payer: Self-pay

## 2014-07-09 DIAGNOSIS — R40241 Glasgow coma scale score 13-15: Secondary | ICD-10-CM

## 2014-07-09 LAB — COMPREHENSIVE METABOLIC PANEL
ALK PHOS: 37 U/L — AB (ref 38–126)
ALT: 17 U/L (ref 14–54)
ANION GAP: 8 (ref 5–15)
AST: 26 U/L (ref 15–41)
Albumin: 2.5 g/dL — ABNORMAL LOW (ref 3.5–5.0)
BUN: 12 mg/dL (ref 6–20)
CO2: 30 mmol/L (ref 22–32)
Calcium: 8.2 mg/dL — ABNORMAL LOW (ref 8.9–10.3)
Chloride: 87 mmol/L — ABNORMAL LOW (ref 101–111)
Creatinine, Ser: 0.54 mg/dL (ref 0.44–1.00)
GFR calc Af Amer: >60 mL/min (ref >60)
Glucose, Bld: 169 mg/dL — ABNORMAL HIGH (ref 65–99)
POTASSIUM: 3 mmol/L — AB (ref 3.5–5.1)
SODIUM: 125 mmol/L — AB (ref 135–145)
Total Bilirubin: 0.2 mg/dL — ABNORMAL LOW (ref 0.3–1.2)
Total Protein: 5 g/dL — ABNORMAL LOW (ref 6.5–8.1)

## 2014-07-09 LAB — BASIC METABOLIC PANEL
Anion gap: 10 (ref 5–15)
BUN: 9 mg/dL (ref 6–20)
CHLORIDE: 91 mmol/L — AB (ref 101–111)
CO2: 31 mmol/L (ref 22–32)
Calcium: 8.6 mg/dL — ABNORMAL LOW (ref 8.9–10.3)
Creatinine, Ser: 0.45 mg/dL (ref 0.44–1.00)
GFR calc Af Amer: >60 mL/min (ref >60)
Glucose, Bld: 101 mg/dL — ABNORMAL HIGH (ref 65–99)
POTASSIUM: 3.7 mmol/L (ref 3.5–5.1)
SODIUM: 132 mmol/L — AB (ref 135–145)

## 2014-07-09 LAB — MAGNESIUM: Magnesium: 1.6 mg/dL — ABNORMAL LOW (ref 1.7–2.4)

## 2014-07-09 LAB — CULTURE, RESPIRATORY: Gram Stain: NONE SEEN

## 2014-07-09 LAB — POCT I-STAT 3, ART BLOOD GAS (G3+)
Acid-Base Excess: 4 mmol/L — ABNORMAL HIGH (ref 0.0–2.0)
Bicarbonate: 29.1 mEq/L — ABNORMAL HIGH (ref 20.0–24.0)
O2 Saturation: 98 %
PCO2 ART: 44.6 mmHg (ref 35.0–45.0)
PH ART: 7.422 (ref 7.350–7.450)
PO2 ART: 99 mmHg (ref 80.0–100.0)
TCO2: 30 mmol/L (ref 0–100)

## 2014-07-09 LAB — CBC WITH DIFFERENTIAL/PLATELET
Basophils Absolute: 0 10*3/uL (ref 0.0–0.1)
Basophils Relative: 0 % (ref 0–1)
Eosinophils Absolute: 0 10*3/uL (ref 0.0–0.7)
Eosinophils Relative: 1 % (ref 0–5)
HCT: 34.9 % — ABNORMAL LOW (ref 36.0–46.0)
Hemoglobin: 12 g/dL (ref 12.0–15.0)
LYMPHS PCT: 12 % (ref 12–46)
Lymphs Abs: 0.7 10*3/uL (ref 0.7–4.0)
MCH: 29.9 pg (ref 26.0–34.0)
MCHC: 34.4 g/dL (ref 30.0–36.0)
MCV: 87 fL (ref 78.0–100.0)
MONOS PCT: 12 % (ref 3–12)
Monocytes Absolute: 0.8 10*3/uL (ref 0.1–1.0)
Neutro Abs: 4.8 10*3/uL (ref 1.7–7.7)
Neutrophils Relative %: 75 % (ref 43–77)
Platelets: 168 10*3/uL (ref 150–400)
RBC: 4.01 MIL/uL (ref 3.87–5.11)
RDW: 13.8 % (ref 11.5–15.5)
WBC: 6.4 10*3/uL (ref 4.0–10.5)

## 2014-07-09 LAB — BLOOD GAS, ARTERIAL
ACID-BASE EXCESS: 3.1 mmol/L — AB (ref 0.0–2.0)
BICARBONATE: 27.4 meq/L — AB (ref 20.0–24.0)
Drawn by: 270221
FIO2: 0.4 %
O2 Saturation: 98.3 %
PATIENT TEMPERATURE: 98.6
PCO2 ART: 44.1 mmHg (ref 35.0–45.0)
PEEP: 5 cmH2O
PH ART: 7.41 (ref 7.350–7.450)
RATE: 30 resp/min
TCO2: 28.8 mmol/L (ref 0–100)
VT: 460 mL
pO2, Arterial: 104 mmHg — ABNORMAL HIGH (ref 80.0–100.0)

## 2014-07-09 LAB — GLUCOSE, CAPILLARY
GLUCOSE-CAPILLARY: 143 mg/dL — AB (ref 65–99)
Glucose-Capillary: 144 mg/dL — ABNORMAL HIGH (ref 65–99)
Glucose-Capillary: 104 mg/dL — ABNORMAL HIGH (ref 65–99)
Glucose-Capillary: 90 mg/dL (ref 65–99)

## 2014-07-09 LAB — CULTURE, RESPIRATORY W GRAM STAIN

## 2014-07-09 LAB — PHOSPHORUS: Phosphorus: 1.9 mg/dL — ABNORMAL LOW (ref 2.5–4.6)

## 2014-07-09 LAB — HERPES SIMPLEX VIRUS(HSV) DNA BY PCR
HSV 1 DNA: NEGATIVE
HSV 2 DNA: NEGATIVE

## 2014-07-09 MED ORDER — DEXTROSE 5 % IV SOLN
1.0000 g | Freq: Three times a day (TID) | INTRAVENOUS | Status: DC
  Administered 2014-07-09 – 2014-07-10 (×3): 1 g via INTRAVENOUS
  Filled 2014-07-09 (×4): qty 1

## 2014-07-09 MED ORDER — POTASSIUM CHLORIDE 20 MEQ/15ML (10%) PO SOLN
30.0000 meq | ORAL | Status: AC
  Administered 2014-07-09 (×2): 30 meq
  Filled 2014-07-09 (×2): qty 30

## 2014-07-09 MED ORDER — IOHEXOL 350 MG/ML SOLN
50.0000 mL | Freq: Once | INTRAVENOUS | Status: AC | PRN
  Administered 2014-07-09: 50 mL via INTRAVENOUS

## 2014-07-09 MED ORDER — POTASSIUM CHLORIDE 10 MEQ/50ML IV SOLN
10.0000 meq | Freq: Once | INTRAVENOUS | Status: AC
  Administered 2014-07-09 (×2): 10 meq via INTRAVENOUS
  Filled 2014-07-09: qty 50

## 2014-07-09 MED ORDER — POTASSIUM CHLORIDE 10 MEQ/50ML IV SOLN
10.0000 meq | INTRAVENOUS | Status: AC
  Administered 2014-07-09 (×4): 10 meq via INTRAVENOUS
  Filled 2014-07-09 (×3): qty 50

## 2014-07-09 NOTE — Procedures (Signed)
**Note De-Identified [place] Obfuscation** Extubation Procedure Note  Patient Details:   Name: [name] DOB: 1951-01-20 MRN: [id number]   Airway Documentation:     Evaluation  O2 sats: stable throughout Complications: No apparent complications Patient did tolerate procedure well. Bilateral Breath Sounds: Clear, Diminished Suctioning: Airway Yes No stridor noted, + leak Raven Harmes, Megan SalonWendy Cooper 07/09/2014, 11:37 AM

## 2014-07-09 NOTE — Progress Notes (Signed)
PT Cancellation Note  Patient Details Name: Daisy FolksRebecca E Pham MRN: [id number] DOB: 1950-11-16   Cancelled Treatment:    Reason Eval/Treat Not Completed: Medical issues which prohibited therapy (Pt extubated this am.  Will evaluate pt in am 6/3.  Thanks. )   Cliffton AstersWhite, Aldeen Riga F 07/09/2014, 12:25 PM  Tillman Kazmierski,PT Acute Rehabilitation [phone] (pager)

## 2014-07-09 NOTE — Progress Notes (Signed)
[facility] FOR AM LAB REPLACEMENT ONLY  The patient does [facility] based on the criteria listed below:   1. Is GFR >/= 40 ml/min? Yes.    Patient's GFR today is >60 2. Is urine output >/= 0.5 ml/kg/hr for the last 6 hours? Yes.   Patient's UOP is 0.9 ml/kg/hr 3. Is BUN < 60 mg/dL? Yes.    Patient's BUN today is 12 4. Abnormal electrolyte(s):K3.0 5. Ordered repletion with: Per protocol  6. If a panic level lab has been reported, has the CCM MD in charge been notified? Yes.  .   Physician:  S Sommer,MD  Melrose NakayamaChisholm, Haitham Dolinsky William 07/09/2014 6:14 AM

## 2014-07-09 NOTE — Progress Notes (Signed)
Subjective: Still intubated but breathing on her own.  Much improved. Following all commands and able to communicate with white board.   Objective: Current vital signs: BP 135/74 mmHg  Pulse 95  Temp(Src) 98.2 F (36.8 C) (Oral)  Resp 18  Ht 5\' 7"  (1.702 m)  Wt 72.2 kg (159 lb 2.8 oz)  BMI 24.92 kg/m2  SpO2 100% Vital signs in last 24 hours: Temp:  [98.1 F (36.7 C)-100.1 F (37.8 C)] 98.2 F (36.8 C) (06/02 0745) Pulse Rate:  [54-128] 95 (06/02 0745) Resp:  [15-34] 18 (06/02 0745) BP: (102-165)/(46-74) 135/74 mmHg (06/02 0700) SpO2:  [100 %] 100 % (06/02 0745) FiO2 (%):  [40 %] 40 % (06/02 0847) Weight:  [72.2 kg (159 lb 2.8 oz)] 72.2 kg (159 lb 2.8 oz) (06/02 0400)  Intake/Output from previous day: 06/01 0701 - 06/02 0700 In: 2261.5 [I.V.:55.8; NG/GT:610.7; IV Piggyback:1595] Out: 3030 [Urine:3030] Intake/Output this shift:   Nutritional status: Diet NPO time specified  Neurologic Exam: General: Mental Status: Alert, oriented,, intubated but communicating via writing.   Able to follow 3 step commands without difficulty. Cranial Nerves: II: ; Visual fields grossly normal, pupils equal, round, reactive to light and accommodation III,IV, VI: ptosis not present, extra-ocular motions intact bilaterally V,VII: smile symmetric, facial light touch sensation normal bilaterally VIII: hearing normal bilaterally XI: bilateral shoulder shrug  Motor: Right : Upper extremity   5/5    Left:     Upper extremity   5/5  Lower extremity   5/5     Lower extremity   5/5 Tone and bulk:normal tone throughout; no atrophy noted Sensory: Pinprick and light touch intact throughout, bilaterally Deep Tendon Reflexes:  Right: Upper Extremity   Left: Upper extremity   biceps (C-5 to C-6) 2/4   biceps (C-5 to C-6) 2/4 tricep (C7) 2/4    triceps (C7) 2/4 Brachioradialis (C6) 2/4  Brachioradialis (C6) 2/4  Lower Extremity Lower Extremity  quadriceps (L-2 to L-4) 2/4   quadriceps (L-2 to L-4)  2/4 Achilles (S1) 2/4   Achilles (S1) 2/4  Plantars: Right: downgoing   Left: downgoing Cerebellar: normal finger-to-nose,  normal heel-to-shin test    Lab Results: Basic Metabolic Panel:  Recent Labs Lab 07/06/14 2345 07/07/14 0128 07/07/14 0325 07/08/14 0430 07/09/14 0400  NA 121* 127* 126* 126* 125*  K 3.6 3.1* 3.3* 3.4* 3.0*  CL 85* 88* 88* 90* 87*  CO2 25 27 26 25 30   GLUCOSE 129* 120* 127* 96 169*  BUN 6 <5* 6 14 12   CREATININE 0.48 0.53 0.72 0.66 0.54  CALCIUM 8.8* 8.5* 8.1* 7.9* 8.2*  MG  --   --   --  1.5*  --   PHOS  --   --   --  2.2*  --     Liver Function Tests:  Recent Labs Lab 07/06/14 0930 07/06/14 2345 07/07/14 0128 07/08/14 0430 07/09/14 0400  AST 22 31 33 25 26  ALT 18 21 19 15 17   ALKPHOS 49 54 56 39 37*  BILITOT 0.6 1.0 0.6 0.6 0.2*  PROT 6.3* 6.7 6.5 4.9* 5.0*  ALBUMIN 3.7 3.8 3.6 2.5* 2.5*   No results for input(s): LIPASE, AMYLASE in the last 168 hours.  Recent Labs Lab 07/07/14 0128  AMMONIA 26    CBC:  Recent Labs Lab 07/06/14 0930 07/06/14 2345 07/07/14 0325 07/08/14 0430 07/09/14 0400  WBC 11.8* 14.8* 15.0* 11.2* 6.4  NEUTROABS 9.7* 13.0*  --  9.6* 4.8  HGB 13.2 15.2* 14.9  12.8 12.0  HCT 40.0 45.4 44.6 38.1 34.9*  MCV 92.0 89.4 91.0 88.2 87.0  PLT 191 175 190 161 168    Cardiac Enzymes: No results for input(s): CKTOTAL, CKMB, CKMBINDEX, TROPONINI in the last 168 hours.  Lipid Panel:  Recent Labs Lab 07/07/14 1026  TRIG 99    CBG:  Recent Labs Lab 07/08/14 1220 07/08/14 1634 07/08/14 1953 07/08/14 2258 07/09/14 0352  GLUCAP 131* 145* 135* 117* 144*    Microbiology: Results for orders placed or performed during the hospital encounter of 07/06/14  Urine culture     Status: None   Collection Time: 07/06/14 11:16 AM  Result Value Ref Range Status   Specimen Description URINE, CLEAN CATCH  Final   Special Requests NONE  Final   Colony Count NO GROWTH Performed at [facility]   Final    Culture NO GROWTH Performed at [facility]   Final   Report Status 07/07/2014 FINAL  Final  Culture, blood (routine x 2)     Status: None (Preliminary result)   Collection Time: 07/06/14 11:30 AM  Result Value Ref Range Status   Specimen Description BLOOD RIGHT ANTECUBITAL  Final   Special Requests BOTTLES DRAWN AEROBIC AND ANAEROBIC 10CC  Final   Culture   Final           BLOOD CULTURE RECEIVED NO GROWTH TO DATE CULTURE WILL BE HELD FOR 5 DAYS BEFORE ISSUING A FINAL NEGATIVE REPORT Performed at [facility]    Report Status PENDING  Incomplete  Culture, blood (routine x 2)     Status: None (Preliminary result)   Collection Time: 07/06/14 11:40 AM  Result Value Ref Range Status   Specimen Description BLOOD RIGHT ARM  Final   Special Requests BOTTLES DRAWN AEROBIC AND ANAEROBIC 10CC  Final   Culture   Final           BLOOD CULTURE RECEIVED NO GROWTH TO DATE CULTURE WILL BE HELD FOR 5 DAYS BEFORE ISSUING A FINAL NEGATIVE REPORT Note: Culture results may be compromised due to an excessive volume of blood received in culture bottles. Performed at [facility]    Report Status PENDING  Incomplete  MRSA PCR Screening     Status: None   Collection Time: 07/06/14 11:51 PM  Result Value Ref Range Status   MRSA by PCR NEGATIVE NEGATIVE Final    Comment:        The GeneXpert MRSA Assay (FDA approved for NASAL specimens only), is one component of a comprehensive MRSA colonization surveillance program. It is not intended to diagnose MRSA infection nor to guide or monitor treatment for MRSA infections.   Culture, respiratory (NON-Expectorated)     Status: None   Collection Time: 07/07/14 12:58 PM  Result Value Ref Range Status   Specimen Description TRACHEAL ASPIRATE  Final   Special Requests NONE  Final   Gram Stain   Final    NO WBC SEEN RARE SQUAMOUS EPITHELIAL CELLS PRESENT FEW YEAST Performed at [facility]    Culture   Final    ABUNDANT  CANDIDA ALBICANS Performed at [facility]    Report Status 07/09/2014 FINAL  Final  Gram stain     Status: None   Collection Time: 07/07/14  8:15 PM  Result Value Ref Range Status   Specimen Description CSF  Final   Special Requests NONE  Final   Gram Stain   Final    CYTOSPUN WBC PRESENT,BOTH  PMN AND MONONUCLEAR NO ORGANISMS SEEN    Report Status 07/07/2014 FINAL  Final  CSF culture     Status: None (Preliminary result)   Collection Time: 07/07/14  8:15 PM  Result Value Ref Range Status   Specimen Description CSF  Final   Special Requests TUBE 2  Final   Gram Stain   Final    CYTOSPIN SLIDE WBC PRESENT,BOTH PMN AND MONONUCLEAR NO ORGANISMS SEEN Performed at [facility] Performed at [facility]    Culture PENDING  Incomplete   Report Status PENDING  Incomplete    Coagulation Studies:  Recent Labs  07/07/14 1910  LABPROT 15.3*  INR 1.20    Imaging: Dg Chest 1 View  07/07/2014   CLINICAL DATA:  Intubation  EXAM: CHEST  1 VIEW  COMPARISON:  Portable exam 1104 hours compared to 07/06/2014  FINDINGS: LEFT jugular central venous catheter tip projects over SVC.  Nasogastric tube extends into stomach.  Tip of endotracheal tube projects approximately 4.3 cm above carina.  Normal heart size and mediastinal contours.  Pulmonary vascular congestion.  Scattered accentuation of interstitial markings slightly greater on RIGHT than LEFT question pulmonary edema.  No pleural effusion or pneumothorax.  Bones unremarkable.  IMPRESSION: Suspect mild pulmonary edema.  Line and tube positions as above.   Electronically Signed   By: Ulyses Southward M.D.   On: 07/07/2014 11:55   Mr Brain Wo Contrast  07/07/2014   CLINICAL DATA:  Fever. Worsening confusion. Chronic back pain with recent spinal cord stimulator removal.  EXAM: MRI HEAD WITHOUT CONTRAST  TECHNIQUE: Multiplanar, multiecho pulse sequences of the brain and surrounding structures were obtained without intravenous  contrast.  COMPARISON:  Head CT 07/07/2014  FINDINGS: There is no evidence of acute infarct, intracranial hemorrhage, mass, midline shift, or extra-axial fluid collection. Ventricles and sulci are normal. Small foci of T2 hyperintensity throughout the subcortical and deep cerebral white matter bilaterally and patchy T2 hyperintensity in the pons are nonspecific but compatible with mild chronic small vessel ischemic disease. A dilated perivascular space is noted inferiorly in the right basal ganglia.  Prior bilateral cataract extraction is noted. Mild mucosal thickening is noted in the ethmoid air cells and maxillary sinuses bilaterally, and there is a trace right mastoid effusion. Moderate-sized joint effusions are noted at the left atlanto-occipital and left atlantoaxial articulations. Major intracranial vascular flow voids are preserved. Endotracheal and enteric tubes are partially visualized, and there is fluid in the nasopharynx and oropharynx.  IMPRESSION: 1. No evidence of acute intracranial abnormality. 2. Mild chronic small vessel ischemic disease.   Electronically Signed   By: Sebastian Ache   On: 07/07/2014 18:13   Mr Thoracic Spine W Wo Contrast  07/07/2014   ADDENDUM REPORT: 07/07/2014 19:09  ADDENDUM: These results were called by telephone at the time of interpretation on 07/07/2014 at 6:55 p.m. to Dr. Rory Percy, who verbally acknowledged these results.   Electronically Signed   By: Sebastian Ache   On: 07/07/2014 19:09   07/07/2014   CLINICAL DATA:  Fever. Worsening confusion. Chronic back pain with recent spinal cord stimulator removal.  EXAM: MRI THORACIC AND LUMBAR SPINE WITHOUT AND WITH CONTRAST  TECHNIQUE: Multiplanar and multiecho pulse sequences of the thoracic and lumbar spine were obtained without and with intravenous contrast.  CONTRAST:  15mL MULTIHANCE GADOBENATE DIMEGLUMINE 529 MG/ML IV SOLN  COMPARISON:  Lumbar spine MRI 03/25/2010  FINDINGS: MR THORACIC SPINE FINDINGS  Vertebral  alignment is within normal limits. There  is a very mild T4 superior endplate compression fracture with minimal edema along superior endplate. Posterior thoracolumbar fusion hardware is partially visualized extending from T10 caudally. The thoracic spinal cord is normal in caliber and signal. No epidural fluid collection is identified. No abnormal enhancement is identified.  At T6-7, there is a small left paracentral disc protrusion without stenosis. At T7-8, there is mild disc bulging without stenosis. At T8-9, there is disc bulging with superimposed right posterior lateral disc protrusion which partially effaces the ventral thecal sac but does not result significant spinal stenosis or mass effect on the spinal cord. There is mild left neural foraminal narrowing at T8-9 due to a foraminal disc protrusion. At T9-10, there is a small central disc protrusion without stenosis.  No paraspinal fluid collection is seen. An enteric tube is partially visualized, and there is fluid in the thoracic esophagus. Patchy foci of abnormal signal in the lower lobes of the lungs may represent mild pulmonary edema as suspected on recent radiographs.  MR LUMBAR SPINE FINDINGS  Slight retrolisthesis of L1 on L2 is unchanged. There is also trace anterolisthesis of L3 on L4. Sequelae of posterior fusion are identified from T11-S1, extended both cranially and caudally since the prior lumbar spine MRI. A fusions screw is partially visualized in the right ilium. Interbody spacers are present at L2-3 and L5-S1. The fusion hardware results in prominent susceptibility artifact which limits evaluation.  Lumbar vertebral body heights are preserved. Fluid collection dorsal to the spinal canal is suboptimally visualized due to susceptibility artifact but appears to extend from the lower thoracic spine caudally to the L4 level, where it measures 1.4 cm in AP thickness. The conus medullaris is obscured by artifact.  A small central disc protrusion at  L1-2 appear slightly less prominent than on the prior study. No lumbar spinal stenosis or neural foraminal stenosis is seen, however evaluation is limited by artifact. No abnormal enhancement is identified.  IMPRESSION: 1. Very mild T4 superior endplate compression fracture, likely subacute to chronic. 2. Mild disc degeneration in the mid and lower thoracic spine without stenosis. 3. Limited evaluation of the lumbar spine due to artifact from extensive posterior fusion. Dorsal epidural fluid collection at the operative levels may represent a postoperative seroma or pseudomeningocele, although infection cannot be excluded by imaging. These results will be called to the ordering clinician or representative by the Radiologist Assistant, and communication documented in the PACS or zVision Dashboard.  Electronically Signed: By: Sebastian Ache On: 07/07/2014 18:41   Mr Lumbar Spine W Wo Contrast  07/07/2014   ADDENDUM REPORT: 07/07/2014 19:09  ADDENDUM: These results were called by telephone at the time of interpretation on 07/07/2014 at 6:55 p.m. to Dr. Rory Percy, who verbally acknowledged these results.   Electronically Signed   By: Sebastian Ache   On: 07/07/2014 19:09   07/07/2014   CLINICAL DATA:  Fever. Worsening confusion. Chronic back pain with recent spinal cord stimulator removal.  EXAM: MRI THORACIC AND LUMBAR SPINE WITHOUT AND WITH CONTRAST  TECHNIQUE: Multiplanar and multiecho pulse sequences of the thoracic and lumbar spine were obtained without and with intravenous contrast.  CONTRAST:  15mL MULTIHANCE GADOBENATE DIMEGLUMINE 529 MG/ML IV SOLN  COMPARISON:  Lumbar spine MRI 03/25/2010  FINDINGS: MR THORACIC SPINE FINDINGS  Vertebral alignment is within normal limits. There is a very mild T4 superior endplate compression fracture with minimal edema along superior endplate. Posterior thoracolumbar fusion hardware is partially visualized extending from T10 caudally. The thoracic spinal cord  is normal in  caliber and signal. No epidural fluid collection is identified. No abnormal enhancement is identified.  At T6-7, there is a small left paracentral disc protrusion without stenosis. At T7-8, there is mild disc bulging without stenosis. At T8-9, there is disc bulging with superimposed right posterior lateral disc protrusion which partially effaces the ventral thecal sac but does not result significant spinal stenosis or mass effect on the spinal cord. There is mild left neural foraminal narrowing at T8-9 due to a foraminal disc protrusion. At T9-10, there is a small central disc protrusion without stenosis.  No paraspinal fluid collection is seen. An enteric tube is partially visualized, and there is fluid in the thoracic esophagus. Patchy foci of abnormal signal in the lower lobes of the lungs may represent mild pulmonary edema as suspected on recent radiographs.  MR LUMBAR SPINE FINDINGS  Slight retrolisthesis of L1 on L2 is unchanged. There is also trace anterolisthesis of L3 on L4. Sequelae of posterior fusion are identified from T11-S1, extended both cranially and caudally since the prior lumbar spine MRI. A fusions screw is partially visualized in the right ilium. Interbody spacers are present at L2-3 and L5-S1. The fusion hardware results in prominent susceptibility artifact which limits evaluation.  Lumbar vertebral body heights are preserved. Fluid collection dorsal to the spinal canal is suboptimally visualized due to susceptibility artifact but appears to extend from the lower thoracic spine caudally to the L4 level, where it measures 1.4 cm in AP thickness. The conus medullaris is obscured by artifact.  A small central disc protrusion at L1-2 appear slightly less prominent than on the prior study. No lumbar spinal stenosis or neural foraminal stenosis is seen, however evaluation is limited by artifact. No abnormal enhancement is identified.  IMPRESSION: 1. Very mild T4 superior endplate compression  fracture, likely subacute to chronic. 2. Mild disc degeneration in the mid and lower thoracic spine without stenosis. 3. Limited evaluation of the lumbar spine due to artifact from extensive posterior fusion. Dorsal epidural fluid collection at the operative levels may represent a postoperative seroma or pseudomeningocele, although infection cannot be excluded by imaging. These results will be called to the ordering clinician or representative by the Radiologist Assistant, and communication documented in the PACS or zVision Dashboard.  Electronically Signed: By: Sebastian Ache On: 07/07/2014 18:41   Dg Chest Port 1 View  07/09/2014   CLINICAL DATA:  COPD  EXAM: PORTABLE CHEST - 1 VIEW  COMPARISON:  07/08/2014  FINDINGS: Endotracheal tube, nasogastric catheter and left jugular central line are again identified in satisfactory position. Postsurgical changes are again noted in the lower thoracic spine. The lungs are well aerated bilaterally. The cardiac shadow is within normal limits.  IMPRESSION: No acute abnormality is noted.  Tubes and lines as described.   Electronically Signed   By: Alcide Clever M.D.   On: 07/09/2014 07:29   Dg Chest Port 1 View  07/08/2014   CLINICAL DATA:  Respiratory failure, history of COPD, hypertension  EXAM: PORTABLE CHEST - 1 VIEW  COMPARISON:  Portable chest x-ray of Jul 07, 2014  FINDINGS: The lungs remain hyperinflated. The pulmonary interstitial markings have improved. The cardiac silhouette is normal in size. The central pulmonary vascularity is less engorged. There is no significant pleural effusion and there is no pneumothorax.  The endotracheal tube tip lies 5.4 cm above the carina. The esophagogastric tube tip projects below the inferior margin of the image. The left internal jugular venous catheter tip projects  over the midportion of the SVC. There are Harrington rods extending from the lower thoracic spine inferiorly. No acute bony abnormality is observed.  IMPRESSION: COPD  with improving pulmonary edema. There is no pneumonia nor significant pleural effusion.   Electronically Signed   By: David  [place] M.D.   On: 07/08/2014 07:47    Medications:  Scheduled: . acyclovir  750 mg Intravenous 3 times per day  . antiseptic oral rinse  7 mL Mouth Rinse QID  . arformoterol  15 mcg Nebulization BID  . budesonide  0.5 mg Nebulization BID  . ceFEPime (MAXIPIME) IV  2 g Intravenous 3 times per day  . chlorhexidine  15 mL Mouth Rinse BID  . furosemide  40 mg Intravenous 3 times per day  . heparin  5,000 Units Subcutaneous 3 times per day  . insulin aspart  0-9 Units Subcutaneous 6 times per day  . ipratropium-albuterol  3 mL Nebulization Q6H  . metronidazole  500 mg Intravenous 4 times per day  . multivitamin with minerals  1 tablet Oral Daily  . naLOXone (NARCAN)  injection  0.4 mg Intravenous Once  . pantoprazole (PROTONIX) IV  40 mg Intravenous Q24H  . potassium chloride  30 mEq Per Tube Q4H  . pravastatin  40 mg Oral Daily  . sodium chloride  10-40 mL Intracatheter Q12H  . vancomycin  1,250 mg Intravenous Q12H    Assessment/Plan: 64 YO female with encephalopathy.  Significant improvement over last 24 hours and now able to follow all commands and communicate via white board. EEG showed no electrographic seizures.   At this time would recommend continue to follow NA and K. No further recommendations at this time.  Neurology will S/O.    Felicie Morn PA-C [facility] [phone]  07/09/2014, 9:04 AM  I personally participated in this patient's evaluation and management, including formulating above clinical impression and management recommendations.  Venetia Maxon M.D. [facility] [phone]

## 2014-07-09 NOTE — Progress Notes (Signed)
ANTIBIOTIC CONSULT NOTE - Follow-up  Pharmacy Consult for cefepime Indication: meningitis  No Known Allergies  Patient Measurements: Height: 5\' 7"  (170.2 cm) Weight: 159 lb 2.8 oz (72.2 kg) IBW/kg (Calculated) : 61.6   Vital Signs: Temp: 98.1 F (36.7 C) (06/02 0900) Temp Source: Oral (06/02 0745) BP: 147/60 mmHg (06/02 0900) Pulse Rate: 97 (06/02 0900) Intake/Output from previous day: 06/01 0701 - 06/02 0700 In: 2261.5 [I.V.:55.8; NG/GT:610.7; IV Piggyback:1595] Out: 3030 [Urine:3030] Intake/Output from this shift:    Labs:  Recent Labs  07/07/14 0325 07/08/14 0430 07/09/14 0400  WBC 15.0* 11.2* 6.4  HGB 14.9 12.8 12.0  PLT 190 161 168  CREATININE 0.72 0.66 0.54   Estimated Creatinine Clearance: 68.2 mL/min (by C-G formula based on Cr of 0.54).  Recent Labs  07/08/14 0430  VANCOTROUGH 12     Microbiology: Recent Results (from the past 720 hour(s))  Urine culture     Status: None   Collection Time: 07/06/14 11:16 AM  Result Value Ref Range Status   Specimen Description URINE, CLEAN CATCH  Final   Special Requests NONE  Final   Colony Count NO GROWTH Performed at [facility]   Final   Culture NO GROWTH Performed at [facility]   Final   Report Status 07/07/2014 FINAL  Final  Culture, blood (routine x 2)     Status: None (Preliminary result)   Collection Time: 07/06/14 11:30 AM  Result Value Ref Range Status   Specimen Description BLOOD RIGHT ANTECUBITAL  Final   Special Requests BOTTLES DRAWN AEROBIC AND ANAEROBIC 10CC  Final   Culture   Final           BLOOD CULTURE RECEIVED NO GROWTH TO DATE CULTURE WILL BE HELD FOR 5 DAYS BEFORE ISSUING A FINAL NEGATIVE REPORT Performed at [facility]    Report Status PENDING  Incomplete  Culture, blood (routine x 2)     Status: None (Preliminary result)   Collection Time: 07/06/14 11:40 AM  Result Value Ref Range Status   Specimen Description BLOOD RIGHT ARM  Final   Special  Requests BOTTLES DRAWN AEROBIC AND ANAEROBIC 10CC  Final   Culture   Final           BLOOD CULTURE RECEIVED NO GROWTH TO DATE CULTURE WILL BE HELD FOR 5 DAYS BEFORE ISSUING A FINAL NEGATIVE REPORT Note: Culture results may be compromised due to an excessive volume of blood received in culture bottles. Performed at [facility]    Report Status PENDING  Incomplete  MRSA PCR Screening     Status: None   Collection Time: 07/06/14 11:51 PM  Result Value Ref Range Status   MRSA by PCR NEGATIVE NEGATIVE Final    Comment:        The GeneXpert MRSA Assay (FDA approved for NASAL specimens only), is one component of a comprehensive MRSA colonization surveillance program. It is not intended to diagnose MRSA infection nor to guide or monitor treatment for MRSA infections.   Culture, respiratory (NON-Expectorated)     Status: None   Collection Time: 07/07/14 12:58 PM  Result Value Ref Range Status   Specimen Description TRACHEAL ASPIRATE  Final   Special Requests NONE  Final   Gram Stain   Final    NO WBC SEEN RARE SQUAMOUS EPITHELIAL CELLS PRESENT FEW YEAST Performed at [facility]    Culture   Final    ABUNDANT CANDIDA ALBICANS Performed at [facility]  Report Status 07/09/2014 FINAL  Final  Gram stain     Status: None   Collection Time: 07/07/14  8:15 PM  Result Value Ref Range Status   Specimen Description CSF  Final   Special Requests NONE  Final   Gram Stain   Final    CYTOSPUN WBC PRESENT,BOTH PMN AND MONONUCLEAR NO ORGANISMS SEEN    Report Status 07/07/2014 FINAL  Final  CSF culture     Status: None (Preliminary result)   Collection Time: 07/07/14  8:15 PM  Result Value Ref Range Status   Specimen Description CSF  Final   Special Requests TUBE 2  Final   Gram Stain   Final    CYTOSPIN SLIDE WBC PRESENT,BOTH PMN AND MONONUCLEAR NO ORGANISMS SEEN Performed at [facility] Performed at [facility]    Culture   Final     NO GROWTH 1 DAY Performed at [facility]    Report Status PENDING  Incomplete    Assessment: 64 yo female on Cefepime for fever and leukocytosis. WBC= 6.4, tmax= 101.1, SCr= 0.54 and CrCl ~ 70. Vancomycin and acyclovir discontinued today as no evidence for meningitis and unlikely with encephalitis per MD. Noted plans for likely discontinuing antibiotics in am.   5/30 blood x2- ngtd 5/30 urine - neg 5/31 CSF fluid - ngtd 5/31 resp- candida albicans  5/30 cefepime>> 5/30 vancomycin>> 6/2  VT 12 on 6/1. Dose changed to  IV q12h 5/31 Acyclovir>> 6/2  Goal of Therapy:  Vancomycin trough level 15-20 mcg/ml  Plan:  -Change cefepime to 1gm IV q8h -Will follow renal function, cultures and clinical progress  Harland German, Pharm D 07/09/2014 11:28 AM

## 2014-07-09 NOTE — Progress Notes (Addendum)
PULMONARY / CRITICAL CARE MEDICINE   Name: JAKE GOODSON MRN: [id number] DOB: Jun 01, 1950    ADMISSION DATE:  07/06/2014 CONSULTATION DATE:  07/09/2014  REFERRING MD :  TRH  CHIEF COMPLAINT:  AMS / agitation / tachypnea  INITIAL PRESENTATION:  63 y.o. F with chronic pain brought to [facility] ED 5/30 due to AMS.  She was admitted by [facility] and later that night became hypoxic, tachypneic and restless.  She was started on BiPAP and transferred to SDU.  PCCM was consulted for admission.   STUDIES:  CXR 5/30 >>> vascular congestion. CT 5/31>>>concern press Mri brain 5/31>>>No evidence of acute intracranial abnormality Mild chronic small vessel ischemic disease MRI thoracic / lumbar>>>Very mild T4 superior endplate compression fracture, likely subacute to chronic.Mild disc degeneration in the mid and lower thoracic spine without stenosis.Limited evaluation of the lumbar spine due to artifact from extensive posterior fusion. Dorsal epidural fluid collection at the operative levels may represent a postoperative seroma or pseudomeningocele, LP 5/31>>>high opening pressure,   SIGNIFICANT EVENTS: 5/30 - admitted with AMS.  PCCM consulted later for agitation and tachypnea. 5/31- intubated, LP, MRI, fever, empiric acyclovir 6/1- improved neuro  SUBJECTIVE: follows commands  VITAL SIGNS: Temp:  [98.1 F (36.7 C)-100.1 F (37.8 C)] 98.1 F (36.7 C) (06/02 0900) Pulse Rate:  [54-128] 97 (06/02 0900) Resp:  [15-34] 18 (06/02 0900) BP: (106-165)/(54-74) 147/60 mmHg (06/02 0900) SpO2:  [100 %] 100 % (06/02 0900) FiO2 (%):  [40 %] 40 % (06/02 0847) Weight:  [72.2 kg (159 lb 2.8 oz)] 72.2 kg (159 lb 2.8 oz) (06/02 0400) HEMODYNAMICS:   VENTILATOR SETTINGS: Vent Mode:  [-] CPAP;PSV FiO2 (%):  [40 %] 40 % Set Rate:  [30 bmp] 30 bmp Vt Set:  [460 mL] 460 mL PEEP:  [5 cmH20] 5 cmH20 Pressure Support:  [5 cmH20] 5 cmH20 Plateau Pressure:  [19 cmH20-28 cmH20] 20 cmH20 INTAKE / OUTPUT: Intake/Output       06/01 0701 - 06/02 0700 06/02 0701 - 06/03 0700   P.O.     I.V. (mL/kg) 55.8 (0.8)    NG/GT 610.7    IV Piggyback 1595    Total Intake(mL/kg) 2261.5 (31.3)    Urine (mL/kg/hr) 3030 (1.7)    Stool 0 (0)    Total Output 3030     Net -768.5          Stool Occurrence 1 x      PHYSICAL EXAMINATION: General: WDWN female, in NAD. Neuro: rass 0 currently, perr, moves all ext HEENT: ett, line clean Cardiovascular: s1 s2 RRR Lungs: ctA Abdomen: BS x 4, soft, NT/ND.  Musculoskeletal: No gross deformities, no edema.  Skin: Intact, warm, no rashes.  LABS:  CBC  Recent Labs Lab 07/07/14 0325 07/08/14 0430 07/09/14 0400  WBC 15.0* 11.2* 6.4  HGB 14.9 12.8 12.0  HCT 44.6 38.1 34.9*  PLT 190 161 168   Coag's  Recent Labs Lab 07/07/14 1910  APTT 33  INR 1.20   BMET  Recent Labs Lab 07/07/14 0325 07/08/14 0430 07/09/14 0400  NA 126* 126* 125*  K 3.3* 3.4* 3.0*  CL 88* 90* 87*  CO2 BUN CREATININE 0.72 0.66 0.54  GLUCOSE 127* 96 169*   Electrolytes  Recent Labs Lab 07/07/14 0325 07/08/14 0430 07/09/14 0400  CALCIUM 8.1* 7.9* 8.2*  MG  --  1.5*  --   PHOS  --  2.2*  --    Sepsis Markers  Recent  Labs Lab 07/06/14 2345 07/07/14 0128 07/07/14 0152  LATICACIDVEN 0.9  --  0.8  PROCALCITON  --  0.35  --    ABG  Recent Labs Lab 07/08/14 0325 07/08/14 1338 07/09/14 0416  PHART 7.386 7.410 7.422  PCO2ART 42.2 44.1 44.6  PO2ART 189* 104* 99.0   Liver Enzymes  Recent Labs Lab 07/07/14 0128 07/08/14 0430 07/09/14 0400  AST 33 25 26  ALT 19 15 17   ALKPHOS 56 39 37*  BILITOT 0.6 0.6 0.2*  ALBUMIN 3.6 2.5* 2.5*   Cardiac Enzymes No results for input(s): TROPONINI, PROBNP in the last 168 hours. Glucose  Recent Labs Lab 07/08/14 1220 07/08/14 1634 07/08/14 1953 07/08/14 2258 07/09/14 0352 07/09/14 0746  GLUCAP 131* 145* 135* 117* 144* 143*    Imaging Dg Chest Port 1 View  07/09/2014   CLINICAL DATA:  COPD   EXAM: PORTABLE CHEST - 1 VIEW  COMPARISON:  07/08/2014  FINDINGS: Endotracheal tube, nasogastric catheter and left jugular central line are again identified in satisfactory position. Postsurgical changes are again noted in the lower thoracic spine. The lungs are well aerated bilaterally. The cardiac shadow is within normal limits.  IMPRESSION: No acute abnormality is noted.  Tubes and lines as described.   Electronically Signed   By: Alcide CleverMark  Lukens M.D.   On: 07/09/2014 07:29     ASSESSMENT / PLAN:  NEUROLOGIC A:   Acute encephalopathy - unclear etiology as this point ? Opiate withdrawal - husband reports pt religiously takes her pain meds; however, UDS negative and narcan in ED with no effect Intermittent agitation Chronic pain syndrome Vent dyschrony LP with high opening pressure, xanthochromia, r/o SAH P:   Improved significantly, xanthochromia noted, consider CT angio brain WUA Correcting NA  PULMONARY A: Acute on chronic hypoxemic and hypercarbic respiratory failure COPD without evidence of exacerbation Tobacco use disorder pulm edema likely  P:   Lasix to neg balance improved edema noted, weaning Wean cpap 5 ps 5, goal 30 min  Upright Neuro status supports extubation  CARDIOVASCULAR A:  Hypotension following initiation of prop Hypertension - received hydralazine and lopressor  Sinus tach - likely due to fever - resolved P:  Hydralazine PRN Lasix maintain  RENAL A:   Hyponatremia Pseudoypocalcemia hypoK P:   bmet awaited kvo Osm 256, hypotonic, hypervolemic likley vs euvolemic and urine som slight inapprop concentrated, r/o siadh as well Lasix is mainstay either way  GASTROINTESTINAL A:  Nutrition P:  . Hold TF weaning well ppi  HEMATOLOGIC A:   VTE Prophylaxis Leukocytosis improved P:  SCD's / Heparin. CBC in AM coags wnl  INFECTIOUS A:   Fever with mild leukocytosis - though no clear evidence of infection thus far R.o encephalitis,  unlikely No evidence meningitis P:   BCx2 5/30 > UCx 5/30 > Sputum  5/31 candida (not a pathogen LP 5/31>>> Abx:  Vanc, start date 5/30>>>6/2 Abx: Cefepime, start date 5/30>>> Acyclovir 5/31>>>6/2  Dc acyclovir as rapid recovery, NOT c/w HSV clinically Dc vanc likely dc all ABX in am    ENDOCRINE A:   No acute issues  P:   No interventions required.  Family updated: Husband at bedside 5/31, 6/2 updated  Interdisciplinary Family Meeting v Palliative Care Meeting:  Due by: 07/13/14.  Ccm time 30 min  Mcarthur Rossettianiel J. Tyson AliasFeinstein, MD, FACP Pgr: [phone] [facility] 07/09/2014 10:35 AM   Family also now reports headahces prior and now For angio head  Mcarthur RossettiDaniel J. Tyson AliasFeinstein, MD, FACP Pgr:  [place]

## 2014-07-10 LAB — CBC WITH DIFFERENTIAL/PLATELET
BASOS PCT: 1 % (ref 0–1)
Basophils Absolute: 0 10*3/uL (ref 0.0–0.1)
EOS PCT: 1 % (ref 0–5)
Eosinophils Absolute: 0.1 10*3/uL (ref 0.0–0.7)
HCT: 37.3 % (ref 36.0–46.0)
HEMOGLOBIN: 12.6 g/dL (ref 12.0–15.0)
Lymphocytes Relative: 16 % (ref 12–46)
Lymphs Abs: 1.2 10*3/uL (ref 0.7–4.0)
MCH: 30.2 pg (ref 26.0–34.0)
MCHC: 33.8 g/dL (ref 30.0–36.0)
MCV: 89.4 fL (ref 78.0–100.0)
MONOS PCT: 13 % — AB (ref 3–12)
Monocytes Absolute: 1 10*3/uL (ref 0.1–1.0)
Neutro Abs: 5.2 10*3/uL (ref 1.7–7.7)
Neutrophils Relative %: 69 % (ref 43–77)
Platelets: 207 10*3/uL (ref 150–400)
RBC: 4.17 MIL/uL (ref 3.87–5.11)
RDW: 14.1 % (ref 11.5–15.5)
WBC: 7.5 10*3/uL (ref 4.0–10.5)

## 2014-07-10 LAB — GLUCOSE, CAPILLARY
GLUCOSE-CAPILLARY: 91 mg/dL (ref 65–99)
GLUCOSE-CAPILLARY: 92 mg/dL (ref 65–99)
GLUCOSE-CAPILLARY: 113 mg/dL — AB (ref 65–99)
Glucose-Capillary: 100 mg/dL — ABNORMAL HIGH (ref 65–99)
Glucose-Capillary: 108 mg/dL — ABNORMAL HIGH (ref 65–99)

## 2014-07-10 LAB — BASIC METABOLIC PANEL
Anion gap: 10 (ref 5–15)
BUN: 9 mg/dL (ref 6–20)
CHLORIDE: 90 mmol/L — AB (ref 101–111)
CO2: 34 mmol/L — ABNORMAL HIGH (ref 22–32)
Calcium: 8.9 mg/dL (ref 8.9–10.3)
Creatinine, Ser: 0.52 mg/dL (ref 0.44–1.00)
GFR calc non Af Amer: >60 mL/min (ref >60)
GLUCOSE: 88 mg/dL (ref 65–99)
Potassium: 3.5 mmol/L (ref 3.5–5.1)
SODIUM: 134 mmol/L — AB (ref 135–145)

## 2014-07-10 LAB — MAGNESIUM: MAGNESIUM: 1.7 mg/dL (ref 1.7–2.4)

## 2014-07-10 LAB — PHOSPHORUS: Phosphorus: 2.9 mg/dL (ref 2.5–4.6)

## 2014-07-10 MED ORDER — OXYCODONE HCL ER 15 MG PO T12A
15.0000 mg | EXTENDED_RELEASE_TABLET | Freq: Two times a day (BID) | ORAL | Status: DC
  Administered 2014-07-10 – 2014-07-13 (×7): 15 mg via ORAL
  Filled 2014-07-10 (×7): qty 1

## 2014-07-10 MED ORDER — FUROSEMIDE 10 MG/ML IJ SOLN
40.0000 mg | Freq: Two times a day (BID) | INTRAMUSCULAR | Status: AC
  Administered 2014-07-10 – 2014-07-11 (×2): 40 mg via INTRAVENOUS
  Filled 2014-07-10 (×2): qty 4

## 2014-07-10 MED ORDER — NICOTINE 7 MG/24HR TD PT24
7.0000 mg | MEDICATED_PATCH | Freq: Every day | TRANSDERMAL | Status: DC
  Administered 2014-07-10 – 2014-07-13 (×4): 7 mg via TRANSDERMAL
  Filled 2014-07-10 (×4): qty 1

## 2014-07-10 MED ORDER — ENSURE ENLIVE PO LIQD
237.0000 mL | Freq: Two times a day (BID) | ORAL | Status: DC
  Administered 2014-07-10 – 2014-07-13 (×4): 237 mL via ORAL

## 2014-07-10 MED ORDER — IPRATROPIUM-ALBUTEROL 0.5-2.5 (3) MG/3ML IN SOLN
3.0000 mL | Freq: Four times a day (QID) | RESPIRATORY_TRACT | Status: DC
  Administered 2014-07-10 – 2014-07-11 (×4): 3 mL via RESPIRATORY_TRACT
  Filled 2014-07-10 (×4): qty 3

## 2014-07-10 MED ORDER — OXYCODONE HCL 5 MG PO TABS
5.0000 mg | ORAL_TABLET | Freq: Four times a day (QID) | ORAL | Status: DC | PRN
  Administered 2014-07-10 – 2014-07-13 (×8): 5 mg via ORAL
  Filled 2014-07-10 (×9): qty 1

## 2014-07-10 NOTE — Evaluation (Signed)
Physical Therapy Evaluation Patient Details Name: [name] MRN: [id number] DOB: July 16, 1950 Today's Date: 07/10/2014   History of Present Illness  64 y.o. F with chronic pain [facility] due to AMS. [facility] and later that night became hypoxic, tachypneic and restless. ETT 5/31-6/2  Clinical Impression  Pt pleasant and sats 93% on RA at rest with sats 88-92% on RA with ambulation with cues for pursed lip breathing. Pt with decreased activity tolerance and balance currently who will benefit from acute therapy to maximize mobility, balance and safety for safe return home with spouse. Encouraged increased mobility with nursing staff.     Follow Up Recommendations Home health PT    Equipment Recommendations  None recommended by PT    Recommendations for Other Services       Precautions / Restrictions Precautions Precautions: Fall Precaution Comments: watch sats Restrictions Weight Bearing Restrictions: No      Mobility  Bed Mobility Overal bed mobility: Modified Independent             General bed mobility comments: with rail and increased time  Transfers Overall transfer level: Needs assistance   Transfers: Sit to/from Stand Sit to Stand: Min guard         General transfer comment: cues for hand placement, safety and sequence from bed and toilet  Ambulation/Gait Ambulation/Gait assistance: Min guard Ambulation Distance (Feet): 300 Feet Assistive device: Rolling walker (2 wheeled) Gait Pattern/deviations: Step-through pattern;Decreased stride length   Gait velocity interpretation: Below normal speed for age/gender General Gait Details: cues for posture, position in RW and safety with initial scissoring x 2 and then improved gait throughout  Stairs            Wheelchair Mobility    Modified Rankin (Stroke Patients Only)       Balance Overall balance assessment: Needs assistance   Sitting balance-Leahy Scale: Fair        Standing balance-Leahy Scale: Poor                               Pertinent Vitals/Pain Pain Assessment: No/denies pain  HR 97 BP 148/58 after activity    Home Living Family/patient expects to be discharged to:: Private residence Living Arrangements: Spouse/significant other Available Help at Discharge: Family;Available 24 hours/day Type of Home: House Home Access: Stairs to enter   [employer] CorporationEntrance Stairs-Number of Steps: 1 Home Layout: One level Home Equipment: Walker - 2 wheels;Cane - single point      Prior Function Level of Independence: Independent               Hand Dominance        Extremity/Trunk Assessment   Upper Extremity Assessment: Overall WFL for tasks assessed           Lower Extremity Assessment: Overall WFL for tasks assessed      Cervical / Trunk Assessment: Normal  Communication   Communication: No difficulties  Cognition Arousal/Alertness: Awake/alert Behavior During Therapy: WFL for tasks assessed/performed Overall Cognitive Status: Impaired/Different from baseline Area of Impairment: Orientation;Safety/judgement Orientation Level: Time       Safety/Judgement: Decreased awareness of deficits          General Comments      Exercises        Assessment/Plan    PT Assessment Patient needs continued PT services  PT Diagnosis Difficulty walking   PT Problem List Decreased activity tolerance;Decreased  balance;Decreased mobility;Decreased knowledge of use of DME;Cardiopulmonary status limiting activity  PT Treatment Interventions Gait training;DME instruction;Stair training;Functional mobility training;Therapeutic activities;Balance training;Patient/family education   PT Goals (Current goals can be found in the Care Plan section) Acute Rehab PT Goals Patient Stated Goal: return home to quilting PT Goal Formulation: With patient Time For Goal Achievement: 07/24/14 Potential to Achieve Goals: Good    Frequency  Min 3X/week   Barriers to discharge Decreased caregiver support      Co-evaluation               End of Session Equipment Utilized During Treatment: Gait belt Activity Tolerance: Patient tolerated treatment well Patient left: in chair;with call bell/phone within reach;with chair alarm set Nurse Communication: Mobility status;Precautions         Time: 1610-9604 PT Time Calculation (min) (ACUTE ONLY): 26 min   Charges:   PT Evaluation $Initial PT Evaluation Tier I: 1 Procedure PT Treatments $Gait Training: 8-22 mins   PT G CodesDelorse Lek 07/10/2014, 9:54 AM Delaney Meigs, PT [phone]

## 2014-07-10 NOTE — Progress Notes (Signed)
Pt arrived to 4N29 via bed family present.  A/O X 4.  Pt welcomed and settled into the room.  Call bell within reach.  Will continue to monitor. Sondra ComeSilva, Hildred Mollica M, RN

## 2014-07-10 NOTE — Progress Notes (Signed)
Nutrition Follow-up  INTERVENTION:  Ensure Enlive (each supplement provides 350kcal and 20 grams of protein) BID  NUTRITION DIAGNOSIS:  Increased nutrient needs related to acute illness as evidenced by estimated needs.  GOAL:  Patient will meet greater than or equal to 90% of their needs  MONITOR:  PO intake, Labs, Weight trends, I & O's  REASON FOR ASSESSMENT:  Consult Enteral/tube feeding initiation and management  ASSESSMENT: 64 y.o. female with past medical history of COPD, chronic respiratory failure on 2 L nasal cannula at baseline (noncompliant with it), still smoking, chronic back pain secondary to multiple lumbar surgeries in the past, Hypertension, hyperlipidemia. [facility] secondary to acute encephalopathy secondary to aspiration pneumonia  Pt was extubated 6/02, pt's diet just advanced to Heart Healthy, pt ordering lunch at time of visit, stating she is very hungry. Encouraged pt to order protein with every meal and eat it first as pt states she fills up quickly. Per pt she has had poor appetite for about a year and admits that she may be replacing her meals with excessive coffee intake. Encouraged pt to always eat before drinking coffee to increase PO. Pt states she likes Boost and drinks it at home, suggested trying Ensure while hospitalized as it has more protein, pt agreed. Will order BID.  Labs reviewed: Na 134     Height:  Ht Readings from Last 1 Encounters:  07/06/14 5\' 7"  (1.702 m)    Weight:  Wt Readings from Last 1 Encounters:  07/10/14 153 lb 14.1 oz (69.8 kg)    Ideal Body Weight:  61 kg  Wt Readings from Last 10 Encounters:  07/10/14 153 lb 14.1 oz (69.8 kg)  06/02/14 157 lb 6.5 oz (71.4 kg)  04/16/14 178 lb 3.2 oz (80.831 kg)  04/09/14 178 lb (80.74 kg)  04/02/14 171 lb (77.565 kg)  03/27/14 172 lb (78.019 kg)  02/13/12 185 lb (83.915 kg)  01/10/12 175 lb (79.379 kg)  10/28/11 218 lb 12.8 oz (99.247 kg)     BMI:  Body mass index is 24.1 kg/(m^2).  Estimated Nutritional Needs:  Kcal:  1600 - 1800  Protein:  75 - 90 g  Fluid:  2.0 L  Skin:  Wound (see comment) (Stage I pressure ulcer on coccyx)  Diet Order:  Diet Heart Room service appropriate?: Yes; Fluid consistency:: Thin  EDUCATION NEEDS:  Education needs addressed   Intake/Output Summary (Last 24 hours) at 07/10/14 1147 Last data filed at 07/10/14 0800  Gross per 24 hour  Intake 1122.5 ml  Output   3950 ml  Net -2827.5 ml    Last BM:  6/01  [name] A. Mayu Ronk Dietetic Intern Pager: [phone] 07/10/2014 11:53 AM

## 2014-07-10 NOTE — Progress Notes (Signed)
PULMONARY / CRITICAL CARE MEDICINE   Name: [name] MRN: [id number] DOB: 1950-05-12    ADMISSION DATE:  07/06/2014 CONSULTATION DATE:  07/10/2014  REFERRING MD :  TRH  CHIEF COMPLAINT:  AMS / agitation / tachypnea  INITIAL PRESENTATION:  64 y.o. F with chronic pain brought to [facility] ED 5/30 due to AMS.  She was admitted by [facility] and later that night became hypoxic, tachypneic and restless.  She was started on BiPAP and transferred to SDU.  PCCM was consulted for admission.   STUDIES:  CXR 5/30 >>> vascular congestion. CT 5/31>>>concern press Mri brain 5/31>>>No evidence of acute intracranial abnormality Mild chronic small vessel ischemic disease MRI thoracic / lumbar>>>Very mild T4 superior endplate compression fracture, likely subacute to chronic.Mild disc degeneration in the mid and lower thoracic spine without stenosis.Limited evaluation of the lumbar spine due to artifact from extensive posterior fusion. Dorsal epidural fluid collection at the operative levels may represent a postoperative seroma or pseudomeningocele, LP 5/31>>>high opening pressure,   SIGNIFICANT EVENTS: 5/30 - admitted with AMS.  PCCM consulted later for agitation and tachypnea. 5/31- intubated, LP, MRI, fever, empiric acyclovir 6/1- improved neuro  SUBJECTIVE:  Extubated successfully, up to chair this am, still on fentanyl infusion  VITAL SIGNS: Temp:  [97.9 F (36.6 C)-98.6 F (37 C)] 98 F (36.7 C) (06/03 0739) Pulse Rate:  [73-102] 102 (06/03 0800) Resp:  [12-25] 19 (06/03 0800) BP: (102-148)/(41-71) 148/58 mmHg (06/03 0800) SpO2:  [90 %-100 %] 96 % (06/03 0807) Weight:  [69.8 kg (153 lb 14.1 oz)] 69.8 kg (153 lb 14.1 oz) (06/03 0447) HEMODYNAMICS:   VENTILATOR SETTINGS:   INTAKE / OUTPUT: Intake/Output      06/02 0701 - 06/03 0700 06/03 0701 - 06/04 0700   I.V. (mL/kg) 587.5 (8.4) 35 (0.5)   NG/GT     IV Piggyback 550    Total Intake(mL/kg) 1137.5 (16.3) 35 (0.5)   Urine (mL/kg/hr) 4950  (3)    Stool     Total Output 4950     Net -3812.5 +35          PHYSICAL EXAMINATION: General: WDWN female, in NAD. Neuro: rass 0 currently, perr, moves all ext HEENT: Op clear Cardiovascular: s1 s2 RRR Lungs: ctA Abdomen: BS x 4, soft, NT/ND.  Musculoskeletal: No gross deformities, no edema.  Skin: Intact, warm, no rashes.  LABS:  CBC  Recent Labs Lab 07/08/14 0430 07/09/14 0400 07/10/14 0515  WBC 11.2* 6.4 7.5  HGB 12.8 12.0 12.6  HCT 38.1 34.9* 37.3  PLT 161 168 207   Coag's  Recent Labs Lab 07/07/14 1910  APTT 33  INR 1.20   BMET  Recent Labs Lab 07/09/14 0400 07/09/14 1800 07/10/14 0515  NA 125* 132* 134*  K 3.0* 3.7 3.5  CL 87* 91* 90*  CO2 30 31 34*  BUN CREATININE 0.54 0.45 0.52  GLUCOSE 169* 101* 88   Electrolytes  Recent Labs Lab 07/08/14 0430 07/09/14 0400 07/09/14 1800 07/10/14 0515  CALCIUM 7.9* 8.2* 8.6* 8.9  MG 1.5*  --  1.6* 1.7  PHOS 2.2*  --  1.9* 2.9   Sepsis Markers  Recent Labs Lab 07/06/14 2345 07/07/14 0128 07/07/14 0152  LATICACIDVEN 0.9  --  0.8  PROCALCITON  --  0.35  --    ABG  Recent Labs Lab 07/08/14 0325 07/08/14 1338 07/09/14 0416  PHART 7.386 7.410 7.422  PCO2ART 42.2 44.1 44.6  PO2ART 189* 104* 99.0   Liver  Enzymes  Recent Labs Lab 07/07/14 0128 07/08/14 0430 07/09/14 0400  AST 33 25 26  ALT 19 15 17   ALKPHOS 56 39 37*  BILITOT 0.6 0.6 0.2*  ALBUMIN 3.6 2.5* 2.5*   Cardiac Enzymes No results for input(s): TROPONINI, PROBNP in the last 168 hours. Glucose  Recent Labs Lab 07/09/14 1155 07/09/14 1625 07/09/14 2007 07/10/14 0007 07/10/14 0432 07/10/14 0739  GLUCAP 104* 90 108* 100* 91 92    Imaging Ct Angio Head W/cm &/or Wo Cm  07/09/2014   CLINICAL DATA:  Encephalopathy.  Xanthochromia on LP.  EXAM: CT ANGIOGRAPHY HEAD  TECHNIQUE: Multidetector CT imaging of the head was performed using the standard protocol during bolus administration of intravenous contrast.  Multiplanar CT image reconstructions and MIPs were obtained to evaluate the vascular anatomy.  CONTRAST:  50mL OMNIPAQUE IOHEXOL 350 MG/ML SOLN  COMPARISON:  Head MRI and CT 07/07/2014. No prior angiographic imaging available.  FINDINGS: CT HEAD  Brain: There is no evidence of acute cortical infarct, intracranial hemorrhage, mass, midline shift, or extra-axial fluid collection. Ventricles and sulci are normal for age.  Calvarium and skull base: No skull fracture or destructive osseous lesion.  Paranasal sinuses: Mild bilateral maxillary sinus and posterior right ethmoid air cell mucosal thickening. Trace right mastoid fluid.  Orbits: Prior bilateral cataract extraction.  CTA HEAD  Anterior circulation: Visualized distal cervical internal carotid arteries are tortuous bilaterally. Intracranial internal carotid arteries are patent with minimal atherosclerotic calcification bilaterally and no significant stenosis. The right A1 segment is absent. ACAs are otherwise unremarkable. MCAs are unremarkable. No intracranial aneurysm is identified.  Posterior circulation: The visualized distal vertebral arteries are patent without stenosis. The left vertebral artery is dominant, and the right vertebral artery is hypoplastic distal to the PICA origin. PICA origins are patent bilaterally, as are the AICA and SCA origins. Basilar artery is patent without stenosis. There is a fetal type origin of the right PCA with hypoplastic P1 segment. PCAs are otherwise unremarkable.  Venous sinuses: Patent.  Anatomic variants: Fetal type origin of the right PCA. Absent right A1 segment.  Delayed phase: No abnormal enhancement.  IMPRESSION: Unremarkable head CTA aside from normal variant anatomy.   Electronically Signed   By: Sebastian AcheAllen  Grady   On: 07/09/2014 15:41     ASSESSMENT / PLAN:  NEUROLOGIC A:   Acute encephalopathy - unclear etiology as this point but all eval points to toxic / metabolic ? Opiate withdrawal - husband reports pt  religiously takes her pain meds; however, UDS negative and narcan in ED with no effect Chronic pain syndrome LP with high opening pressure, xanthochromia, MRI brain reassuring, CT Head + angio unremarkable P:   continue to correct lytes Careful with narcotics, goal back to Po on 6/3  PULMONARY A: Acute on chronic hypoxemic and hypercarbic respiratory failure COPD without evidence of exacerbation Tobacco use disorder pulm edema likely  P:   Give nicotine patch, needs cessation counseling   CARDIOVASCULAR A:  Hypotension following initiation of propofol, resolved Hypertension - received hydralazine and lopressor P:  Restart home meds when able to take PO  RENAL A:   Hyponatremia Pseudoypocalcemia hypoK P:   Follow BMP Osm 256, hypotonic, hypervolemic likley vs euvolemic and urine som slight inapprop concentrated, r/o siadh as well Continue gentle lasix  GASTROINTESTINAL A:  Nutrition P:  . Start diet 6/3 ppi  HEMATOLOGIC A:   VTE Prophylaxis Leukocytosis improved P:  SCD's / Heparin. CBC in AM coags wnl  INFECTIOUS A:  Fever with mild leukocytosis - though no clear evidence of infection thus far R.o encephalitis, unlikely No evidence meningitis P:   BCx2 5/30 > UCx 5/30 > negative Sputum  5/31 candida (not a pathogen LP 5/31>>> Abx:  Vanc, start date 5/30>>>6/2 Abx: Cefepime, start date 5/30>>> 6/3 Acyclovir 5/31>>>6/2  Stop all abx on 6/3, follow clinically    ENDOCRINE A:   No acute issues  P:   No interventions required.  Family updated: Husband aand other family updated at bedside on 6/3  Interdisciplinary Family Meeting v Palliative Care Meeting:  Due by: 07/13/14.   Will move to floor bed on 6/3, to [facility] service as of 6/4.   Levy Pupa, MD, PhD 07/10/2014, 11:14 AM [place] [facility] [phone] or if no answer [phone]

## 2014-07-11 DIAGNOSIS — J432 Centrilobular emphysema: Secondary | ICD-10-CM

## 2014-07-11 LAB — BASIC METABOLIC PANEL
Anion gap: 11 (ref 5–15)
BUN: 14 mg/dL (ref 6–20)
CHLORIDE: 88 mmol/L — AB (ref 101–111)
CO2: 35 mmol/L — AB (ref 22–32)
Calcium: 9 mg/dL (ref 8.9–10.3)
Creatinine, Ser: 0.55 mg/dL (ref 0.44–1.00)
Glucose, Bld: 85 mg/dL (ref 65–99)
Potassium: 3.7 mmol/L (ref 3.5–5.1)
SODIUM: 134 mmol/L — AB (ref 135–145)

## 2014-07-11 LAB — CSF CULTURE W GRAM STAIN: Culture: NO GROWTH

## 2014-07-11 LAB — CSF CULTURE

## 2014-07-11 LAB — MAGNESIUM: Magnesium: 1.6 mg/dL — ABNORMAL LOW (ref 1.7–2.4)

## 2014-07-11 LAB — PHOSPHORUS: PHOSPHORUS: 3.3 mg/dL (ref 2.5–4.6)

## 2014-07-11 MED ORDER — IPRATROPIUM-ALBUTEROL 0.5-2.5 (3) MG/3ML IN SOLN
3.0000 mL | Freq: Four times a day (QID) | RESPIRATORY_TRACT | Status: DC | PRN
  Administered 2014-07-11: 3 mL via RESPIRATORY_TRACT
  Filled 2014-07-11: qty 3

## 2014-07-11 MED ORDER — HYDROMORPHONE HCL 1 MG/ML IJ SOLN
1.0000 mg | Freq: Once | INTRAMUSCULAR | Status: AC
  Administered 2014-07-11: 1 mg via INTRAVENOUS

## 2014-07-11 MED ORDER — GABAPENTIN 300 MG PO CAPS
300.0000 mg | ORAL_CAPSULE | Freq: Two times a day (BID) | ORAL | Status: DC
  Administered 2014-07-11 – 2014-07-13 (×4): 300 mg via ORAL
  Filled 2014-07-11 (×4): qty 1

## 2014-07-11 MED ORDER — HYDROMORPHONE HCL 1 MG/ML IJ SOLN
INTRAMUSCULAR | Status: AC
  Filled 2014-07-11: qty 1

## 2014-07-11 MED ORDER — GABAPENTIN 600 MG PO TABS: 300.0000 mg | ORAL_TABLET | Freq: Two times a day (BID) | ORAL | Status: DC

## 2014-07-11 NOTE — Progress Notes (Signed)
Patient states her pain is uncontrolled, stating 9/10 pain in back, patient practicing breathing exercises to decrease pain. Patient offered warm compress, ice pack, repositioning. Patient refused. MD aware, new order for one time dose of IV Dilaudid. Report given to night RN

## 2014-07-11 NOTE — Progress Notes (Signed)
eLink Physician-Brief Progress Note Patient Name: Daisy FolksRebecca E Turnage DOB: 1951/01/13 MRN: [id number]   Date of Service  07/11/2014  HPI/Events of Note  Patient with continuous lower back pain, sharp, chronic, home regiment not helping. 9/10  eICU Interventions  Dilaudid IV 1mg  once.      Intervention Category Intermediate Interventions: Other:  Aldea Avis 07/11/2014, 6:51 PM

## 2014-07-11 NOTE — Progress Notes (Signed)
eLink Physician-Brief Progress Note Patient Name: Daisy FolksRebecca E Pham DOB: [birth year] MRN: [id number]   Date of Service  07/11/2014  HPI/Events of Note  Patient with history of chronic back pain, on narcotics.  Currently, with 8/10 back discomfort, back on home regiment of narcotics without relief  eICU Interventions  Start Neurontin slowly. 300mg  BID, and inc to home dose slowly.      Intervention Category Intermediate Interventions: Other:  Chauntay Paszkiewicz 07/11/2014, 4:23 PM

## 2014-07-11 NOTE — Progress Notes (Signed)
PULMONARY / CRITICAL CARE MEDICINE   Name: [name] MRN: [id number] DOB: 06-16-1950    ADMISSION DATE:  07/06/2014 CONSULTATION DATE:  07/11/2014  REFERRING MD :  TRH  CHIEF COMPLAINT:  AMS / agitation / tachypnea  INITIAL PRESENTATION:  63 yowf with chronic pain brought to [facility] ED 5/30 due to AMS.  She was admitted by [facility] and later that night became hypoxic, tachypneic and restless.  She was started on BiPAP and transferred to SDU.  PCCM was consulted for admission.   STUDIES:  CXR 5/30 >>> vascular congestion. CT 5/31>>> concern PRESS Mri brain 5/31>>>No evidence of acute intracranial abnormality Mild chronic small vessel ischemic disease MRI thoracic / lumbar>>>Very mild T4 superior endplate compression fracture, likely subacute to chronic.Mild disc degeneration in the mid and lower thoracic spine without stenosis.Limited evaluation of the lumbar spine due to artifact from extensive posterior fusion. Dorsal epidural fluid collection at the operative levels may represent a postoperative seroma or pseudomeningocele, LP 5/31>>>high opening pressure,   SIGNIFICANT EVENTS: 5/30 - admitted with AMS.  PCCM consulted later for agitation and tachypnea. 5/31- intubated, LP, MRI, fever, empiric acyclovir 6/1- improved neuro  SUBJECTIVE:  Walking in room s 02/ using 4 pronged walker   VITAL SIGNS: Temp:  [97.4 F (36.3 C)-98.6 F (37 C)] 98.6 F (37 C) (06/04 0934) Pulse Rate:  [78-104] 104 (06/04 0934) Resp:  [14-21] 20 (06/04 0934) BP: (110-165)/(56-87) 117/62 mmHg (06/04 0934) SpO2:  [89 %-98 %] 89 % (06/04 0934) HEMODYNAMICS:   VENTILATOR SETTINGS:   INTAKE / OUTPUT: Intake/Output      06/03 0701 - 06/04 0700 06/04 0701 - 06/05 0700   I.V. (mL/kg) 35 (0.5) 10 (0.1)   IV Piggyback     Total Intake(mL/kg) 35 (0.5) 10 (0.1)   Urine (mL/kg/hr)     Total Output       Net +35 +10        Urine Occurrence 3 x 1 x     PHYSICAL EXAMINATION: General: thin wf  , in  NAD. Neuro: alert/ ambulatory no recall of recent events  HEENT: Op clear Cardiovascular: s1 s2 RRR Lungs: distant bs bilaterally  Abdomen: BS x 4, soft, NT/ND.  Musculoskeletal: No gross deformities, no edema.  Skin: Intact, warm, no rashes.  LABS:  CBC  Recent Labs Lab 07/08/14 0430 07/09/14 0400 07/10/14 0515  WBC 11.2* 6.4 7.5  HGB 12.8 12.0 12.6  HCT 38.1 34.9* 37.3  PLT 161 168 207   Coag's  Recent Labs Lab 07/07/14 1910  APTT 33  INR 1.20   BMET  Recent Labs Lab 07/09/14 1800 07/10/14 0515 07/11/14 0545  NA 132* 134* 134*  K 3.7 3.5 3.7  CL 91* 90* 88*  CO2 31 34* 35*  BUN CREATININE 0.45 0.52 0.55  GLUCOSE 101* 88 85   Electrolytes  Recent Labs Lab 07/09/14 1800 07/10/14 0515 07/11/14 0545  CALCIUM 8.6* 8.9 9.0  MG 1.6* 1.7 1.6*  PHOS 1.9* 2.9 3.3   Sepsis Markers  Recent Labs Lab 07/06/14 2345 07/07/14 0128 07/07/14 0152  LATICACIDVEN 0.9  --  0.8  PROCALCITON  --  0.35  --    ABG  Recent Labs Lab 07/08/14 0325 07/08/14 1338 07/09/14 0416  PHART 7.386 7.410 7.422  PCO2ART 42.2 44.1 44.6  PO2ART 189* 104* 99.0   Liver Enzymes  Recent Labs Lab 07/07/14 0128 07/08/14 0430 07/09/14 0400  AST 33 25 26  ALT 19 15 17  ALKPHOS 56 39 37*  BILITOT 0.6 0.6 0.2*  ALBUMIN 3.6 2.5* 2.5*   Cardiac Enzymes No results for input(s): TROPONINI, PROBNP in the last 168 hours. Glucose  Recent Labs Lab 07/09/14 1625 07/09/14 2007 07/10/14 0007 07/10/14 0432 07/10/14 0739 07/10/14 1206  GLUCAP 90 108* 100* 91 92 113*    Imaging No results found.   ASSESSMENT / PLAN:  NEUROLOGIC A:   Acute encephalopathy - unclear etiology as this point but all eval points to toxic / metabolic ? Opiate withdrawal - husband reports pt religiously takes her pain meds; however, UDS negative and narcan in ED with no effect Chronic pain syndrome LP with high opening pressure, xanthochromia, MRI brain reassuring, CT Head + angio  unremarkable P:    Careful with narcotics / tolerating back on maint rx and off IV's    PULMONARY A: Acute on chronic hypoxemic and hypercarbic respiratory failure COPD without evidence of exacerbation Tobacco use disorder pulm edema likely  P:   Give nicotine patch,   cessation counseling 6/4     CARDIOVASCULAR A:  Hypotension following initiation of propofol, resolved Hypertension - received hydralazine and lopressor P:  For now, drug holiday and add back meds as needed    RENAL A:   Hyponatremia, very mild and likely asymptomatic  Pseudoypocalcemia HypoK, resolved  P:   Follow BMP    GASTROINTESTINAL A:  Nutrition P:  . tol  Diet  ppi   HEMATOLOGIC A:   VTE Prophylaxis Leukocytosis improved P:   Heparin.[place]    INFECTIOUS A:   Fever with mild leukocytosis - though no clear evidence of infection thus far R.o encephalitis, unlikely No evidence meningitis   BCx2 5/30 > UCx 5/30 > negative Sputum  5/31 candida (not a pathogen) LP 5/31> few poly's No org seen > neg  Abx:  Vanc,   5/30>>>6/2 Abx: Cefepime,  5/30>>> 6/3 Acyclovir 5/31>>>6/2  rec D/c'd  all abx on 6/3, follow clinically    ENDOCRINE A:   No acute issues  P:   No interventions required.   Doing well so far on floor  no bp meds for now and use the KIS principle here    Sandrea HughsMichael Kerin Kren, MD Pulmonary and Critical Care Medicine [facility] Cell [phone] After 5:30 PM or weekends, call [phone]

## 2014-07-12 DIAGNOSIS — J9612 Chronic respiratory failure with hypercapnia: Secondary | ICD-10-CM | POA: Diagnosis present

## 2014-07-12 LAB — CULTURE, BLOOD (ROUTINE X 2)
CULTURE: NO GROWTH
Culture: NO GROWTH

## 2014-07-12 MED ORDER — SALINE SPRAY 0.65 % NA SOLN
1.0000 | NASAL | Status: DC | PRN
  Administered 2014-07-12: 1 via NASAL
  Filled 2014-07-12: qty 44

## 2014-07-12 MED ORDER — CLONIDINE HCL 0.1 MG PO TABS
0.1000 mg | ORAL_TABLET | Freq: Two times a day (BID) | ORAL | Status: DC
  Administered 2014-07-12 – 2014-07-13 (×3): 0.1 mg via ORAL
  Filled 2014-07-12 (×3): qty 1

## 2014-07-12 NOTE — Progress Notes (Signed)
Patient complained of nasal dryness provider made aware new order for ocean nasal spary given.

## 2014-07-12 NOTE — Progress Notes (Signed)
PULMONARY / CRITICAL CARE MEDICINE   Name: [name] MRN: [id number] DOB: Nov 16, 1950    ADMISSION DATE:  07/06/2014 CONSULTATION DATE:  07/12/2014  REFERRING MD :  TRH  CHIEF COMPLAINT:  AMS / agitation / tachypnea  INITIAL PRESENTATION:  63 yowf with chronic pain brought to [facility] ED 5/30 due to AMS.  She was admitted by [facility] and later that night became hypoxic, tachypneic and restless.  She was started on BiPAP and transferred to SDU.  PCCM was consulted for admission.   STUDIES:  CXR 5/30 >>> vascular congestion. CT 5/31>>> concern PRESS Mri brain 5/31>>>No evidence of acute intracranial abnormality Mild chronic small vessel ischemic disease MRI thoracic / lumbar>>>Very mild T4 superior endplate compression fracture, likely subacute to chronic.Mild disc degeneration in the mid and lower thoracic spine without stenosis.Limited evaluation of the lumbar spine due to artifact from extensive posterior fusion. Dorsal epidural fluid collection at the operative levels may represent a postoperative seroma or pseudomeningocele, LP 5/31>>>high opening pressure,   SIGNIFICANT EVENTS: 5/30 - admitted with AMS.  PCCM consulted later for agitation and tachypnea. 5/31- intubated, LP, MRI, fever, empiric acyclovir 6/1- improved neuro  SUBJECTIVE:  Lying almost flat on my arrival, c/o worsening back pain, usually monitored by [facility] who rx's with opioids   VITAL SIGNS: Temp:  [98.2 F (36.8 C)-98.9 F (37.2 C)] 98.5 F (36.9 C) (06/05 0958) Pulse Rate:  [72-91] 89 (06/05 0958) Resp:  [16-18] 18 (06/05 0958) BP: (115-128)/(48-63) 128/63 mmHg (06/05 0958) SpO2:  [83 %-97 %] 94 % (06/05 0958) HEMODYNAMICS:   VENTILATOR SETTINGS:   INTAKE / OUTPUT: Intake/Output      06/04 0701 - 06/05 0700 06/05 0701 - 06/06 0700   P.O.  240   I.V. (mL/kg) 10 (0.1) 10 (0.1)   Total Intake(mL/kg) 10 (0.1) 250 (3.6)   Net +10 +250        Urine Occurrence 1 x      PHYSICAL EXAMINATION: General:  thin wf  , in NAD. Neuro: alert/ approp without motor deficits HEENT: Op clear Cardiovascular: s1 s2 RRR Lungs: distant bs bilaterally  Abdomen: BS x 4, soft, NT/ND.  Musculoskeletal: No gross deformities, no edema.  Skin: Intact, warm, no rashes.  LABS:  CBC  Recent Labs Lab 07/08/14 0430 07/09/14 0400 07/10/14 0515  WBC 11.2* 6.4 7.5  HGB 12.8 12.0 12.6  HCT 38.1 34.9* 37.3  PLT 161 168 207   Coag's  Recent Labs Lab 07/07/14 1910  APTT 33  INR 1.20   BMET  Recent Labs Lab 07/09/14 1800 07/10/14 0515 07/11/14 0545  NA 132* 134* 134*  K 3.7 3.5 3.7  CL 91* 90* 88*  CO2 31 34* 35*  BUN CREATININE 0.45 0.52 0.55  GLUCOSE 101* 88 85   Electrolytes  Recent Labs Lab 07/09/14 1800 07/10/14 0515 07/11/14 0545  CALCIUM 8.6* 8.9 9.0  MG 1.6* 1.7 1.6*  PHOS 1.9* 2.9 3.3   Sepsis Markers  Recent Labs Lab 07/06/14 2345 07/07/14 0128 07/07/14 0152  LATICACIDVEN 0.9  --  0.8  PROCALCITON  --  0.35  --    ABG  Recent Labs Lab 07/08/14 0325 07/08/14 1338 07/09/14 0416  PHART 7.386 7.410 7.422  PCO2ART 42.2 44.1 44.6  PO2ART 189* 104* 99.0   Liver Enzymes  Recent Labs Lab 07/07/14 0128 07/08/14 0430 07/09/14 0400  AST 33 25 26  ALT ALKPHOS 56 39 37*  BILITOT 0.6 0.6 0.2*  ALBUMIN 3.6 2.5* 2.5*   Cardiac Enzymes No results for input(s): TROPONINI, PROBNP in the last 168 hours. Glucose  Recent Labs Lab 07/09/14 1625 07/09/14 2007 07/10/14 0007 07/10/14 0432 07/10/14 0739 07/10/14 1206  GLUCAP 90 108* 100* 91 92 113*    Imaging No results found.   ASSESSMENT / PLAN:  NEUROLOGIC A:   Acute encephalopathy - unclear etiology as this point but all eval points to toxic / metabolic ? Opiate withdrawal - husband reports pt religiously takes her pain meds; however, UDS negative and narcan in ED with no effect Chronic pain syndrome LP with high opening pressure, xanthochromia, MRI brain reassuring, CT Head +  angio unremarkable P:    Careful with narcotics /  Try low dose clonidine 6/5 as requiredprn IV dilaudid am 6/5    PULMONARY A: Acute on chronic hypoxemic and hypercarbic respiratory failure COPD without evidence of exacerbation Tobacco use disorder pulm edema likely  P:   Give nicotine patch,   cessation counseling 6/4     CARDIOVASCULAR A:  Hypotension following initiation of propofol, resolved Hypertension - received hydralazine and lopressor P:  Started clonidine 6/5 for ? narc w/d issues   RENAL A:   Hyponatremia, very mild and likely asymptomatic  Pseudoypocalcemia HypoK, resolved  P:   Follow BMP    GASTROINTESTINAL A:  Nutrition P:  . tol  Diet  ppi   HEMATOLOGIC A:   VTE Prophylaxis Leukocytosis improved P:   Heparin.[place]    INFECTIOUS A:   Fever with mild leukocytosis - though no clear evidence of infection thus far R.o encephalitis, unlikely No evidence meningitis   BCx2 5/30 > neg  UCx 5/30 > negative Sputum  5/31 candida (not a pathogen) LP 5/31> few poly's No org seen > neg  Abx:  Vanc,   5/30>>>6/2 Abx: Cefepime,  5/30>>> 6/3 Acyclovir 5/31>>>6/2  rec D/c'd  all abx on 6/3, follow clinically    ENDOCRINE A:   No acute issues  P:   No interventions required.   Doing well so far on floor    use the KIS principle here  Extended discussion with pt goal is to control pain, not eliminate it  Total time spent counseling 25 m   Sandrea HughsMichael Jeralyn Nolden, MD Pulmonary and Critical Care Medicine [facility] Cell [phone] After 5:30 PM or weekends, call [phone]

## 2014-07-13 DIAGNOSIS — J449 Chronic obstructive pulmonary disease, unspecified: Secondary | ICD-10-CM

## 2014-07-13 MED ORDER — MAGNESIUM SULFATE 2 GM/50ML IV SOLN
2.0000 g | Freq: Once | INTRAVENOUS | Status: AC
  Administered 2014-07-13: 2 g via INTRAVENOUS
  Filled 2014-07-13: qty 50

## 2014-07-13 MED ORDER — CLONIDINE HCL 0.1 MG PO TABS: 0.1000 mg | ORAL_TABLET | Freq: Two times a day (BID) | ORAL | Status: AC

## 2014-07-13 MED ORDER — POLYETHYLENE GLYCOL 3350 17 G PO PACK: 17.0000 g | PACK | Freq: Every day | ORAL | Status: DC | PRN

## 2014-07-13 MED ORDER — NICOTINE 7 MG/24HR TD PT24: 7.0000 mg | MEDICATED_PATCH | Freq: Every day | TRANSDERMAL | Status: DC

## 2014-07-13 MED ORDER — SALINE SPRAY 0.65 % NA SOLN: 1.0000 | NASAL | Status: AC | PRN

## 2014-07-13 NOTE — Progress Notes (Addendum)
PULMONARY / CRITICAL CARE MEDICINE   Name: [name] MRN: [id number] DOB: Oct 19, 1950    ADMISSION DATE:  07/06/2014 CONSULTATION DATE:  07/13/2014  REFERRING MD :  TRH  CHIEF COMPLAINT:  AMS / agitation / tachypnea  INITIAL PRESENTATION:  63 yowf with chronic pain brought to [facility] ED 5/30 due to AMS.  She was admitted by [facility] and later that night became hypoxic, tachypneic and restless.  She was started on BiPAP and transferred to SDU.  PCCM was consulted for admission.   STUDIES:  CXR 5/30 >>> vascular congestion. CT 5/31>>> concern PRESS Mri brain 5/31>>>No evidence of acute intracranial abnormality Mild chronic small vessel ischemic disease MRI thoracic / lumbar>>>Very mild T4 superior endplate compression fracture, likely subacute to chronic.Mild disc degeneration in the mid and lower thoracic spine without stenosis.Limited evaluation of the lumbar spine due to artifact from extensive posterior fusion. Dorsal epidural fluid collection at the operative levels may represent a postoperative seroma or pseudomeningocele, LP 5/31>>>high opening pressure,   SIGNIFICANT EVENTS: 5/30 - admitted with AMS.  PCCM consulted later for agitation and tachypnea. 5/31- intubated, LP, MRI, fever, empiric acyclovir 6/1- improved neuro 07/10/14: extubated -> move to floor. Stopped all abx. Seen by PT - recommended home PT with fall precautions and transfer assist and rolling walker 07/11/14: walking rin room with walker. Monitor off bp meds. Neurontin started. Dilaudid Given for prn 07/12/14: Lying almost flat on my arrival, c/o worsening back pain, usually monitored by [facility] who rx's with opioids . Patient counseled on pain control    SUBJECTIVE/OVERNIGHT/INTERVAL HX 07/13/14: In bed with make up on. Says ready to go home. Husband hx -> confusion cleared. Doing x-word puzzles which is beyond normal for her and him. The both feel stregnth is good enough to be at home; near baseline. Has walker at  home  VITAL SIGNS: Temp:  [97.8 F (36.6 C)-98.5 F (36.9 C)] 98.5 F (36.9 C) (06/06 1021) Pulse Rate:  [65-90] 82 (06/06 1021) Resp:  [16-20] 18 (06/06 1021) BP: (102-137)/(53-74) 127/53 mmHg (06/06 1021) SpO2:  [91 %-96 %] 91 % (06/06 1021) HEMODYNAMICS:   VENTILATOR SETTINGS:   INTAKE / OUTPUT: Intake/Output      06/05 0701 - 06/06 0700 06/06 0701 - 06/07 0700   P.O. 480    I.V. (mL/kg) 10 (0.1)    Total Intake(mL/kg) 490 (7)    Net +490          Urine Occurrence 3 x      PHYSICAL EXAMINATION: General: thin wf  , in NAD. Looks better than prior descriptions Neuro: alert/ approp without motor deficits. Sitting in bed. Doing x-word.  HEENT: Op clear O2 on Cardiovascular: s1 s2 RRR Lungs: distant bs bilaterally . Barrell chest +. Purse lip breathing + Abdomen: BS x 4, soft, NT/ND.  Musculoskeletal: No gross deformities, no edema.  Skin: Intact, warm, no rashes.  LABS: PULMONARY  Recent Labs Lab 07/06/14 2206 07/07/14 1304 07/08/14 0325 07/08/14 1338 07/09/14 0416  PHART 7.326* 7.220* 7.386 7.410 7.422  PCO2ART 58.4* 62.2* 42.2 44.1 44.6  PO2ART 71.6* 78.0* 189* 104* 99.0  HCO3 29.3* 25.5* 24.5* 27.4* 29.1*  TCO2 31.0 27 25.8 28.8 30  O2SAT 93.6 92.0 99.3 98.3 98.0    CBC  Recent Labs Lab 07/08/14 0430 07/09/14 0400 07/10/14 0515  HGB 12.8 12.0 12.6  HCT 38.1 34.9* 37.3  WBC 11.2* 6.4 7.5  PLT 161 168 207    COAGULATION  Recent Labs Lab 07/07/14 1910  INR  1.20    CARDIAC  No results for input(s): TROPONINI in the last 168 hours. No results for input(s): PROBNP in the last 168 hours.   CHEMISTRY  Recent Labs Lab 07/08/14 0430 07/09/14 0400 07/09/14 1800 07/10/14 0515 07/11/14 0545  NA 126* 125* 132* 134* 134*  K 3.4* 3.0* 3.7 3.5 3.7  CL 90* 87* 91* 90* 88*  CO2 25 30 31  34* 35*  GLUCOSE 96 169* 101* 88 85  BUN 14 12 9 9 14   CREATININE 0.66 0.54 0.45 0.52 0.55  CALCIUM 7.9* 8.2* 8.6* 8.9 9.0  MG 1.5*  --  1.6* 1.7 1.6*   PHOS 2.2*  --  1.9* 2.9 3.3   Estimated Creatinine Clearance: 68.2 mL/min (by C-G formula based on Cr of 0.55).   LIVER  Recent Labs Lab 07/06/14 2345 07/07/14 0128 07/07/14 1910 07/08/14 0430 07/09/14 0400  AST 31 33  --  25 26  ALT 21 19  --  15 17  ALKPHOS 54 56  --  39 37*  BILITOT 1.0 0.6  --  0.6 0.2*  PROT 6.7 6.5  --  4.9* 5.0*  ALBUMIN 3.8 3.6  --  2.5* 2.5*  INR  --   --  1.20  --   --      INFECTIOUS  Recent Labs Lab 07/06/14 2345 07/07/14 0128 07/07/14 0152  LATICACIDVEN 0.9  --  0.8  PROCALCITON  --  0.35  --      ENDOCRINE CBG (last 3)   Recent Labs  07/10/14 1206  GLUCAP 113*         IMAGING x48h  - image(s) personally visualized  -   highlighted in bold No results found.       ASSESSMENT / PLAN:  NEUROLOGIC A:   Acute encephalopathy - unclear etiology as this point but all eval points to toxic / metabolic ? Opiate withdrawal - husband reports pt religiously takes her pain meds; however, UDS negative and narcan in ED with no effect Chronic pain syndrome LP with high opening pressure, xanthochromia, MRI brain reassuring, CT Head + angio unremarkable  07/13/14: confusion cleared up. Still dealing with pain issues  P:   Opioids and other chronic pain med - but has been advised to be careful with dosing   PULMONARY A: Acute on chronic hypoxemic and hypercarbic respiratory failure COPD without evidence of exacerbation Tobacco use disorder pulm edema likely    - copd stable  P:   Give nicotine patch,   cessation counseling 6/4  BD per opd regimen    CARDIOVASCULAR A:  Hypotension following initiation of propofol, resolved Hypertension - received hydralazine and lopressor  - Started clonidine 6/5 for ? narc w/d issues  P Home BP meds ? Needs to be on clonidine at dc - not sure   RENAL  Recent Labs Lab 07/08/14 0430 07/09/14 0400 07/09/14 1800 07/10/14 0515 07/11/14 0545  NA 126* 125* 132* 134* 134*  K  3.4* 3.0* 3.7 3.5 3.7  CL 90* 87* 91* 90* 88*  CO2 25 30 31  34* 35*  GLUCOSE 96 169* 101* 88 85  BUN 14 12 9 9 14   CREATININE 0.66 0.54 0.45 0.52 0.55  CALCIUM 7.9* 8.2* 8.6* 8.9 9.0  MG 1.5*  --  1.6* 1.7 1.6*  PHOS 2.2*  --  1.9* 2.9 3.3    A:   Hyponatremia, very mild and likely asymptomatic   Pseudoypocalcemia HypoK, resolved   07/13/14: Hypomagnesemia 07/11/14 - not repleted  P:   2gm mag sulfate IV before discharge home 07/13/2014  Follow BMP    GASTROINTESTINAL A:  Nutrition P:  . tol  Diet  ppi   HEMATOLOGIC  Recent Labs Lab 07/08/14 0430 07/09/14 0400 07/10/14 0515  HGB 12.8 12.0 12.6  HCT 38.1 34.9* 37.3  WBC 11.2* 6.4 7.5  PLT 161 168 207    A:   VTE Prophylaxis Leukocytosis improved/esolved  - nil acute 07/13/2014     P:   Heparin.[place] while inpatient     INFECTIOUS  BCx2 5/30 > neg  UCx 5/30 > negative Sputum  5/31 candida (not a pathogen) LP 5/31> few poly's No org seen > neg    A:   Fever with mild leukocytosis - though no clear evidence of infection thus far R.o encephalitis, unlikely No evidence meningitis   - doing well without fever. OFf all abx sincd 07/10/14   Abx:  Vanc,   5/30>>>6/2 Abx: Cefepime,  5/30>>> 6/3 Acyclovir 5/31>>>6/2  Follow clinically   ENDOCRINE A:   No acute issues  P:   No interventions required.   GLOBAL A: physical deconditioning   P: PT recommending home health PT on their note 07/10/14   Aim DC 07/13/2014 with home PT after mag sulfate infusion OPD fu with Dr Sherene Sires COPD and pcp Delorse Lek, MD needed   Dr. Kalman Shan, M.D., [facility].C.P Pulmonary and Critical Care Medicine Staff Physician [facility] [place] Pulmonary and Critical Care Pager: [phone], If no answer or between  15:00h - 7:00h: call [phone]  07/13/2014 10:43 AM

## 2014-07-13 NOTE — Care Management Note (Addendum)
Case Management Note  Patient Details  Name: [name] MRN: [id number] Date of Birth: 08/25/1950  Subjective/Objective:                    Action/Plan:  Met with patient to discuss home health services. Patient has chosen [facility] for HHPT. Miranda with [facility] was notified and has accepted the referral for discharge home today. Preferred contact is husband Danny [phone].  Expected Discharge Date:                  Expected Discharge Plan:  Home w Home Health Services  In-House Referral:     Discharge planning Services     Post Acute Care Choice:    Choice offered to:     DME Arranged:   (Patient has a rollator at home.) DME Agency:     HH Arranged:  PT HH Agency:  [facility]  Status of Service:     Medicare Important Message Given:  Yes Date Medicare IM Given:  07/10/14 Medicare IM give by:  debbie dowell rn,bsn Date Additional Medicare IM Given:  07/13/14 Additional Medicare Important Message give by:    RN, MSN, CM  If discussed at Long Length of Stay Meetings, dates discussed:    Additional Comments:  ,  C, RN 07/13/2014, 4:08 PM  

## 2014-07-13 NOTE — Discharge Summary (Signed)
Physician Discharge Summary  Patient ID: SHYIA FILLINGIM MRN: [id number] DOB/AGE: [birth year] 64 y.o.  Admit date: 07/06/2014 Discharge date: 07/13/2014    Discharge Diagnoses:  Acute Encephalopathy  Opiate Withdrawal Chronic Pain Syndrome  Deconditioning  Acute on Chronic Hypoxemic / Hypercarbic Respiratory Failure COPD  Tobacco Abuse  Pulmonary Edema  Hypotension  Hypertension  Hyponatremia  Pseudohypocalcemia  Hypokalemia Hypomagnesemia  Fever  Leukocytosis                                                                        DISCHARGE PLAN BY DIAGNOSIS     Acute Encephalopathy - meningitis ruled out, MRI reassuring.  Suspected toxic / metabolic encephalopathy  Opiate Withdrawal - ? Withdrawal, UDS was negative on admission, narcan with no effect.   Chronic Pain Syndrome  Deconditioning   Discharge Plan: Home PT for discharge Arranged for rolling walker and shower stool  Continue current pain regimen, no new narcotics given at time of discharge.  Pt reports 6/6 that she has current medication regimen at home.   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 Melvyn Novas as scheduled for COPD review / hospital follow up  Smoking cessation counseling  Resume 2L O2 Q HS  Resume prior bronchodilators, spiriva   Hypotension  Hypertension   Discharge Plan: BP medications changed while inpatient to clonidine with concern for narcotic withdrawal issues.  This can be tapered back to prior regimen of amlodipine / benicar per primary MD.   Hyponatremia  Pseudohypocalcemia  Hypokalemia Hypomagnesemia   Discharge Plan: Assess BMP at time of hospital follow up  Fever / Leukocytosis - no acute infectious source identified.  Fever/leukocytosis resolved prior to discharge  Discharge Plan: Resolved, Follow up CBC  PRN                 DISCHARGE SUMMARY   LAQUEENA HINCHEY is a 64 y.o. y/o female with a PMH of HLD, 2L nocturnal O2 dependent COPD, HTN, and chronic pain syndrome secondary to multiple lumbar surgeries who presented to [facility] ER on 5/30 with altered mental status (drooling, hard to arouse, confused) and mild respiratory acidosis.  She was recently discharged from [facility] for a similar episode with acute encephalopathy secondary to aspiration PNA and excessive narcotics (reportedly reduced at discharge).   Initial ER work up was notable for fever of 101, mild leukocytosis of 11.8, negative UA, negative CXR and UDS was negative (even though husband confirmed he was giving medications as prescribed).  She also had a recent removal of a stimulator removed 10 days prior to admit (per Dr. Jesusita Oka, pain medication MD).  She was initially admitted per [facility] for further evaluation.  She was scheduled for MRI of lumbar and thoracic spine due to recent surgery for removal of spinal cord stimulator. Prior to MRI, she was noted to be hypertensive to 191/95 and was agitated. She was given antihypertensives as well as 22m Ativan and later became much less responsive. MRI was attempted but pt began flailing and was unable to lie flat therefore MRI not obtained.  Fever had spiked to 103.2 and pt was intermittently hypoxic and tachypneic; therefore, she was started on BiPAP and transferred to SDU for further evaluation.   In early AM hours of 5/31, pt became more restless but remained altered. BP remained elevated and only significant lab findings were sodium of 121. Lactic acid normal at 0.9. She developed fever to 103. UA and CXR clear. PCCM was consulted for further recs.  She was started on precedex for restlessness / agitation. CT head was assessed and concerning for PRESS. Hopes were that once on precedex, imaging could be obtained. Unfortunately after precedex was started, pt's SBP  dropped into 60's. Precedex stopped and pt bolused with IVF's.  Follow up MRI of Brain, thoracic and lumbar spine were negative for acute abnormality (see more detailed report below).  Given fever and leukocytosis, she underwent an LP to rule out meningitis which had an opening pressure of 29.  The LP was bloody but showed no evidence of acute infection.   She was maintained on mechanical ventilation in ICU until 6/03 at which time she was liberated from the vent.  EEG 6/1 was negative for seizure activity but showed mod-severe encephalopathy.  Her mental status slowly improved.  Antibiotics / antivirals were stopped.  She was evaluated by PT with recommendations as above.  Blood pressure regimen was altered during admission to clonidine out of concern for possible opiate withdrawal.  Home medications were held.  Patient confirmed prior to discharge that she does have narcotic supply at home.  Working diagnosis for metabolic encephalopathy at time of discharge was potential opiate withdrawal.  The patient was medically cleared for discharge with plans as above.    STUDIES:  5/30  CXR >> vascular congestion. 5/31  CT >> concern PRESS 5/31  MRI Brain >> No evidence of acute intracranial abnormality Mild chronic small vessel ischemic disease 5/31  MRI thoracic / lumbar spine >> very mild T4 superior endplate compression fracture, likely subacute to chronic. Mild disc degeneration in the mid and lower thoracic spine without stenosis.  Limited evaluation of the lumbar spine due to artifact from extensive posterior fusion. Dorsal epidural fluid collection at the operative levels may represent a postoperative seroma or pseudomeningocele 5/31  LP  >> opening pressure of 29, closing pressure 8   6/01  EEG >> abnormal EEG with moderately severe encephalopathy, no evidence of seizure  SIGNIFICANT EVENTS: 5/30  Admitted with AMS, respiratory failure. PCCM consulted later for agitation and tachypnea. 5/31   Intubated, LP, MRI, fever, empiric acyclovir 6/01  Improved neuro status 6/03  Extubated -> moved to floor. Stopped all abx. Seen by PT - recommended home PT with fall precautions and transfer assist and rolling walker 6/04  Walking rin room with walker. Monitor off bp meds. Neurontin started. Dilaudid Given for prn 6/05  Lying almost flat on arrival to room, c/o worsening back pain, sees [facility] who rx's opioids . Patient counseled on pain control    MICRO DATA  5/30  MRSA PCR >> neg 5/30  UC >> neg  5/31  Respiratory culture >> abundant candida  5/31  CSF >> neg  5/30  Blood cultures x2 >> neg   ANTIBIOTICS Vancomycin 5/30 >> 6/2 Cefepime 5/30 >> 6/2 Acyclovir 5/31 >> 6/2   CONSULTS Neurology - Dr. Dorian Pod  TUBES / LINES L IJ TLC 5/31 >> out OETT 5/31 >> 6/3      Discharge Exam: General: thin adult female, appears older than age in NAD  Neuro: AAOx4, speech clear, MAE CV: s1s2 rrr, no m/r/g PULM: resp's even/non-labored on RA, lungs bilaterally distant, barrel chest  GI: NTND, bsx4 active Extremities: warm/dry, no edema, no rashes / lesions  Filed Vitals:   07/13/14 0220 07/13/14 0654 07/13/14 1021 07/13/14 1346  BP: 102/56 116/64 127/53 118/63  Pulse: 66 65 82 71  Temp: 98.2 F (36.8 C) 98 F (36.7 C) 98.5 F (36.9 C) 97.5 F (36.4 C)  TempSrc: Oral Oral Oral Oral  Resp: 16 18 18 20   Height:      Weight:      SpO2: 96% 95% 91% 95%     Discharge Labs  BMET  Recent Labs Lab 07/08/14 0430 07/09/14 0400 07/09/14 1800 07/10/14 0515 07/11/14 0545  NA 126* 125* 132* 134* 134*  K 3.4* 3.0* 3.7 3.5 3.7  CL 90* 87* 91* 90* 88*  CO2 25 30 31  34* 35*  GLUCOSE 96 169* 101* 88 85  BUN 14 12 9 9 14   CREATININE 0.66 0.54 0.45 0.52 0.55  CALCIUM 7.9* 8.2* 8.6* 8.9 9.0  MG 1.5*  --  1.6* 1.7 1.6*  PHOS 2.2*  --  1.9* 2.9 3.3   CBC  Recent Labs Lab 07/08/14 0430 07/09/14 0400 07/10/14 0515  HGB 12.8 12.0 12.6  HCT 38.1 34.9* 37.3  WBC 11.2*  6.4 7.5  PLT 161 168 207   Anti-Coagulation  Recent Labs Lab 07/07/14 1910  INR 1.20    Discharge Instructions    Call MD for:  difficulty breathing, headache or visual disturbances    Complete by:  As directed      Call MD for:  extreme fatigue    Complete by:  As directed      Call MD for:  hives    Complete by:  As directed      Call MD for:  persistant dizziness or light-headedness    Complete by:  As directed      Call MD for:  persistant nausea and vomiting    Complete by:  As directed      Call MD for:  severe uncontrolled pain    Complete by:  As directed      Call MD for:  temperature >100.4    Complete by:  As directed      Diet - low sodium heart healthy    Complete by:  As directed      Discharge instructions    Complete by:  As directed   1.  Review your medications carefully as they have changed.   2.  Follow up with Dr. Elease Hashimoto and Dr. Melvyn Novas as scheduled 3.  Stop smoking!! 4.  Continue home O2 at 2L per Nasal Cannula at night     Increase activity slowly    Complete by:  As directed                 Follow-up Information    Follow up with Christinia Gully, MD On 07/20/2014.   Specialty:  Pulmonary Disease   Why:  Appt at 2:30 PM - [facility] across the road from [place], on the second floor.     Contact information:   [address] [place] [address] [phone]       Follow up with Stephens Shire, MD On 07/20/2014.   Specialty:  Family Medicine   Why:  Appt at 8:50 AM (please call if to reschedule if appt does not work)   [facility] information:  [address] [place] [address] [phone]       Schedule an appointment as soon as possible for a visit with Almedia Balls, MD.   Specialty:  Orthopedic Surgery   Why:  As needed for chronic pain needs   Contact information:   [place] [address] [phone]          Medication List    STOP taking these  medications        amLODipine 10 MG tablet  Commonly known as:  NORVASC     olmesartan 20 MG tablet  Commonly known as:  BENICAR     POTASSIUM PO     rOPINIRole 0.25 MG tablet  Commonly known as:  REQUIP      TAKE these medications        albuterol (2.5 MG/3ML) 0.083% nebulizer solution  Commonly known as:  PROVENTIL  Take 3 mLs (2.5 mg total) by nebulization every 6 (six) hours as needed for wheezing or shortness of breath.     albuterol 108 (90 BASE) MCG/ACT inhaler  Commonly known as:  PROAIR HFA  2 puffs every 4 hours as needed only  if your can't catch your breath     aspirin 81 MG tablet  Take 81 mg by mouth daily.     budesonide-formoterol 160-4.5 MCG/ACT inhaler  Commonly known as:  SYMBICORT  Inhale 2 puffs into the lungs 2 (two) times daily.     CALCIUM PO  Take 1 tablet by mouth daily.     cloNIDine 0.1 MG tablet  Commonly known as:  CATAPRES  Take 1 tablet (0.1 mg total) by mouth 2 (two) times daily.     gabapentin 300 MG capsule  Commonly known as:  NEURONTIN  Take 300 mg by mouth 4 (four) times daily.     multivitamin with minerals Tabs tablet  Take 1 tablet by mouth daily.     nicotine 7 mg/24hr patch  Commonly known as:  NICODERM CQ - dosed in mg/24 hr  Place 1 patch (7 mg total) onto the skin daily.     oxyCODONE 5 MG immediate release tablet  Commonly known as:  Oxy IR/ROXICODONE  Take 1 tablet (5 mg total) by mouth every 6 (six) hours as needed for severe pain or breakthrough pain.     OxyCODONE 15 mg T12a 12 hr tablet  Commonly known as:  OXYCONTIN  Take 1 tablet (15 mg total) by mouth every 12 (twelve) hours.     polyethylene glycol packet  Commonly known as:  MIRALAX / GLYCOLAX  Take 17 g by mouth daily as needed for mild constipation.     pravastatin 40 MG tablet  Commonly known as:  PRAVACHOL  Take 40 mg by mouth daily.     sodium chloride 0.65 % Soln nasal spray  Commonly known as:  OCEAN  Place 1 spray into both nostrils as  needed for congestion.     tiotropium 18 MCG inhalation capsule  Commonly known as:  SPIRIVA  Place 18 mcg into inhaler and inhale daily.          Disposition:  Home with home PT, RN, rolling walker, shower chair, & prior home O2.    Discharged Condition: LENNY BOUCHILLON has met maximum benefit of inpatient care and is medically stable and cleared for discharge.  Patient is pending follow up as above.      Time spent on disposition:  Greater  than 35 minutes.   Signed: Noe Gens, NP-C [facility] Pgr: [phone] Office: [phone]

## 2014-07-13 NOTE — Progress Notes (Signed)
Patient is discharged from room 4N29 at this time. Alert and in stable condition. IV site d/c'd and instructions read to patient and understanding verbalized. Left unit via wheelchair with all belongings and husband at side.

## 2014-07-13 NOTE — Progress Notes (Signed)
Chaplain initiated visit with pt. Pt spoke about how she wants to go home. Chaplain offered emotional support and to keep pt in prayers; gesture appreciated. Chaplain informed pt of her services should she need them. Page chaplain as needed.    07/13/14 0900  Clinical Encounter Type  Visited With Patient  Visit Type Initial;Spiritual support  Referral From Nurse  Spiritual Encounters  Spiritual Needs Emotional;Prayer  Stress Factors  Patient Stress Factors Health changes  Belenda Alviar, Mayer MaskerCourtney F, Chaplain 07/13/2014 9:57 AM

## 2014-07-14 IMAGING — CR DG CHEST 2V
2 series · 2 of 2 positions shown · non-contrast
Comparison: 11/29/2009

CLINICAL DATA: Cough.  Chest discomfort.  Hypertension.

CHEST - 2 VIEW

[w chest pa]
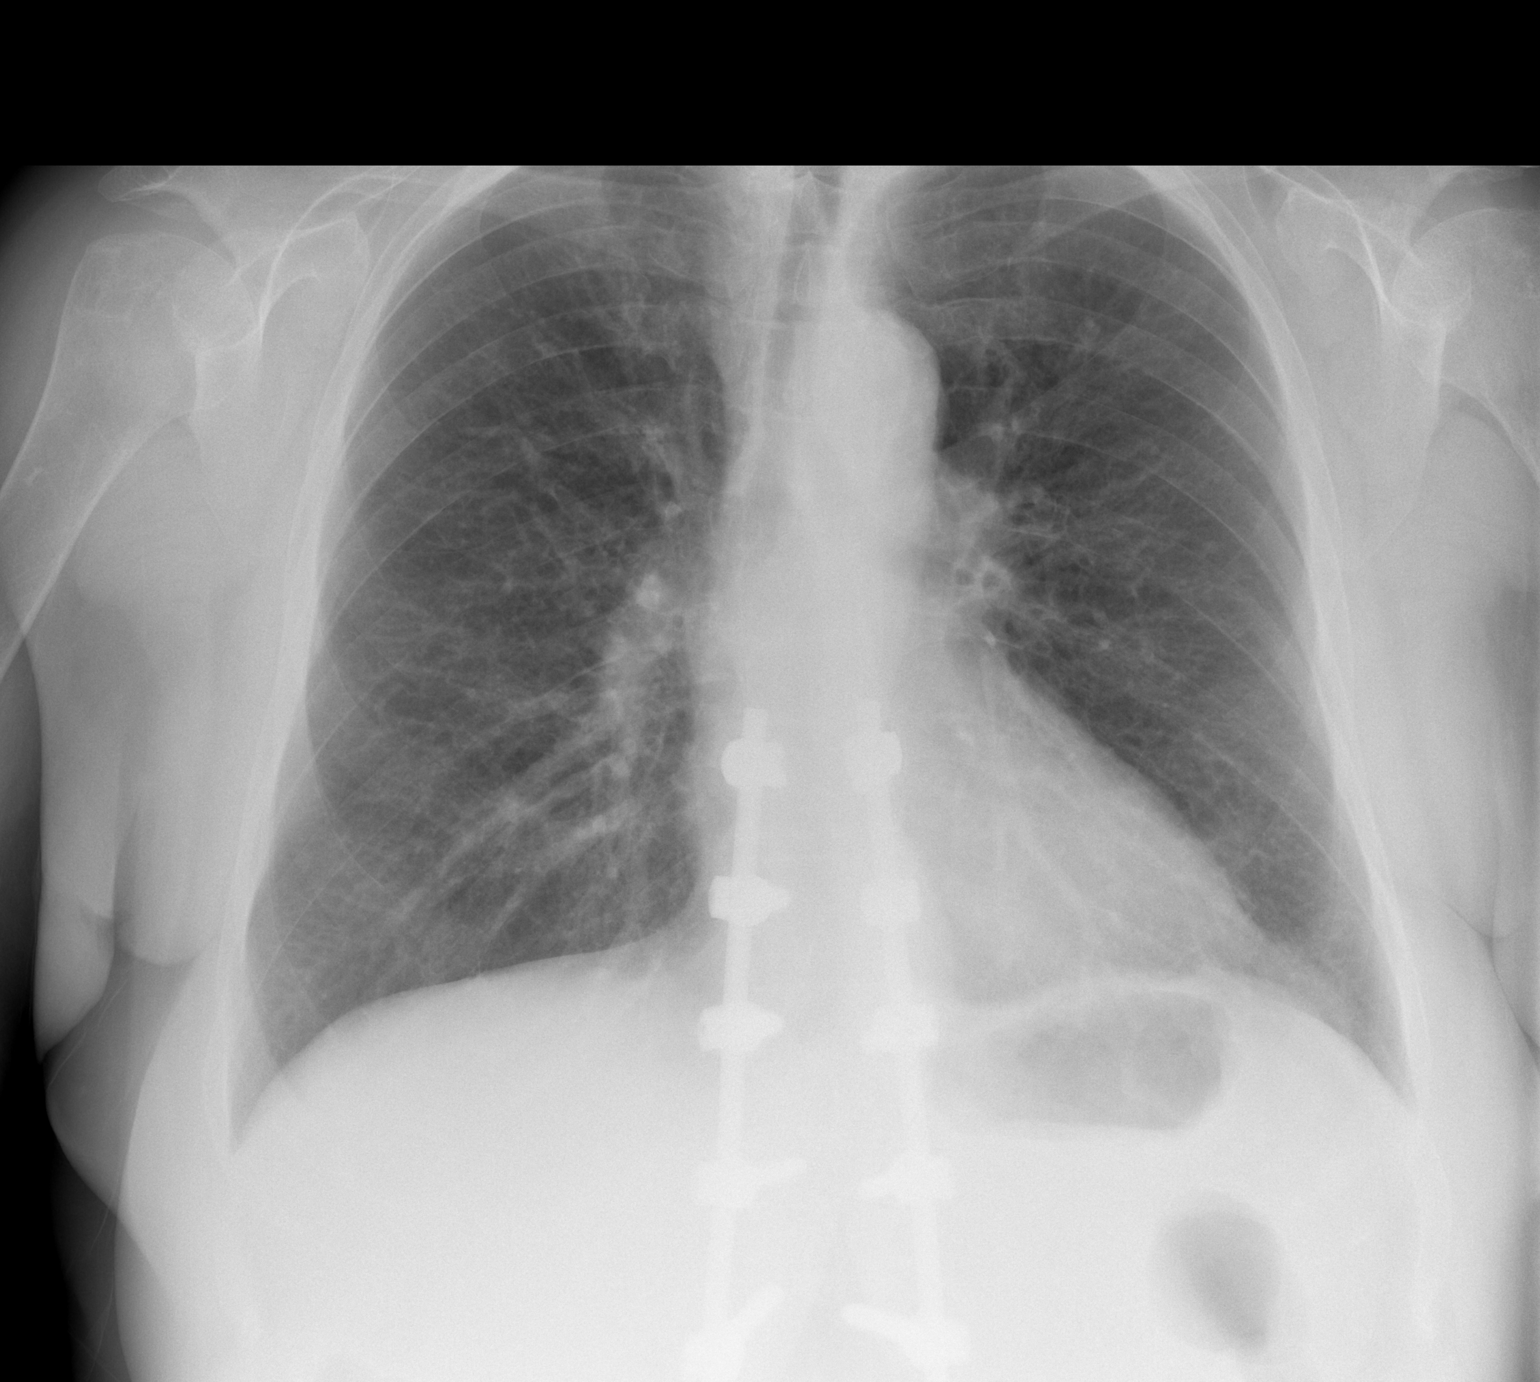

[w chest lat]
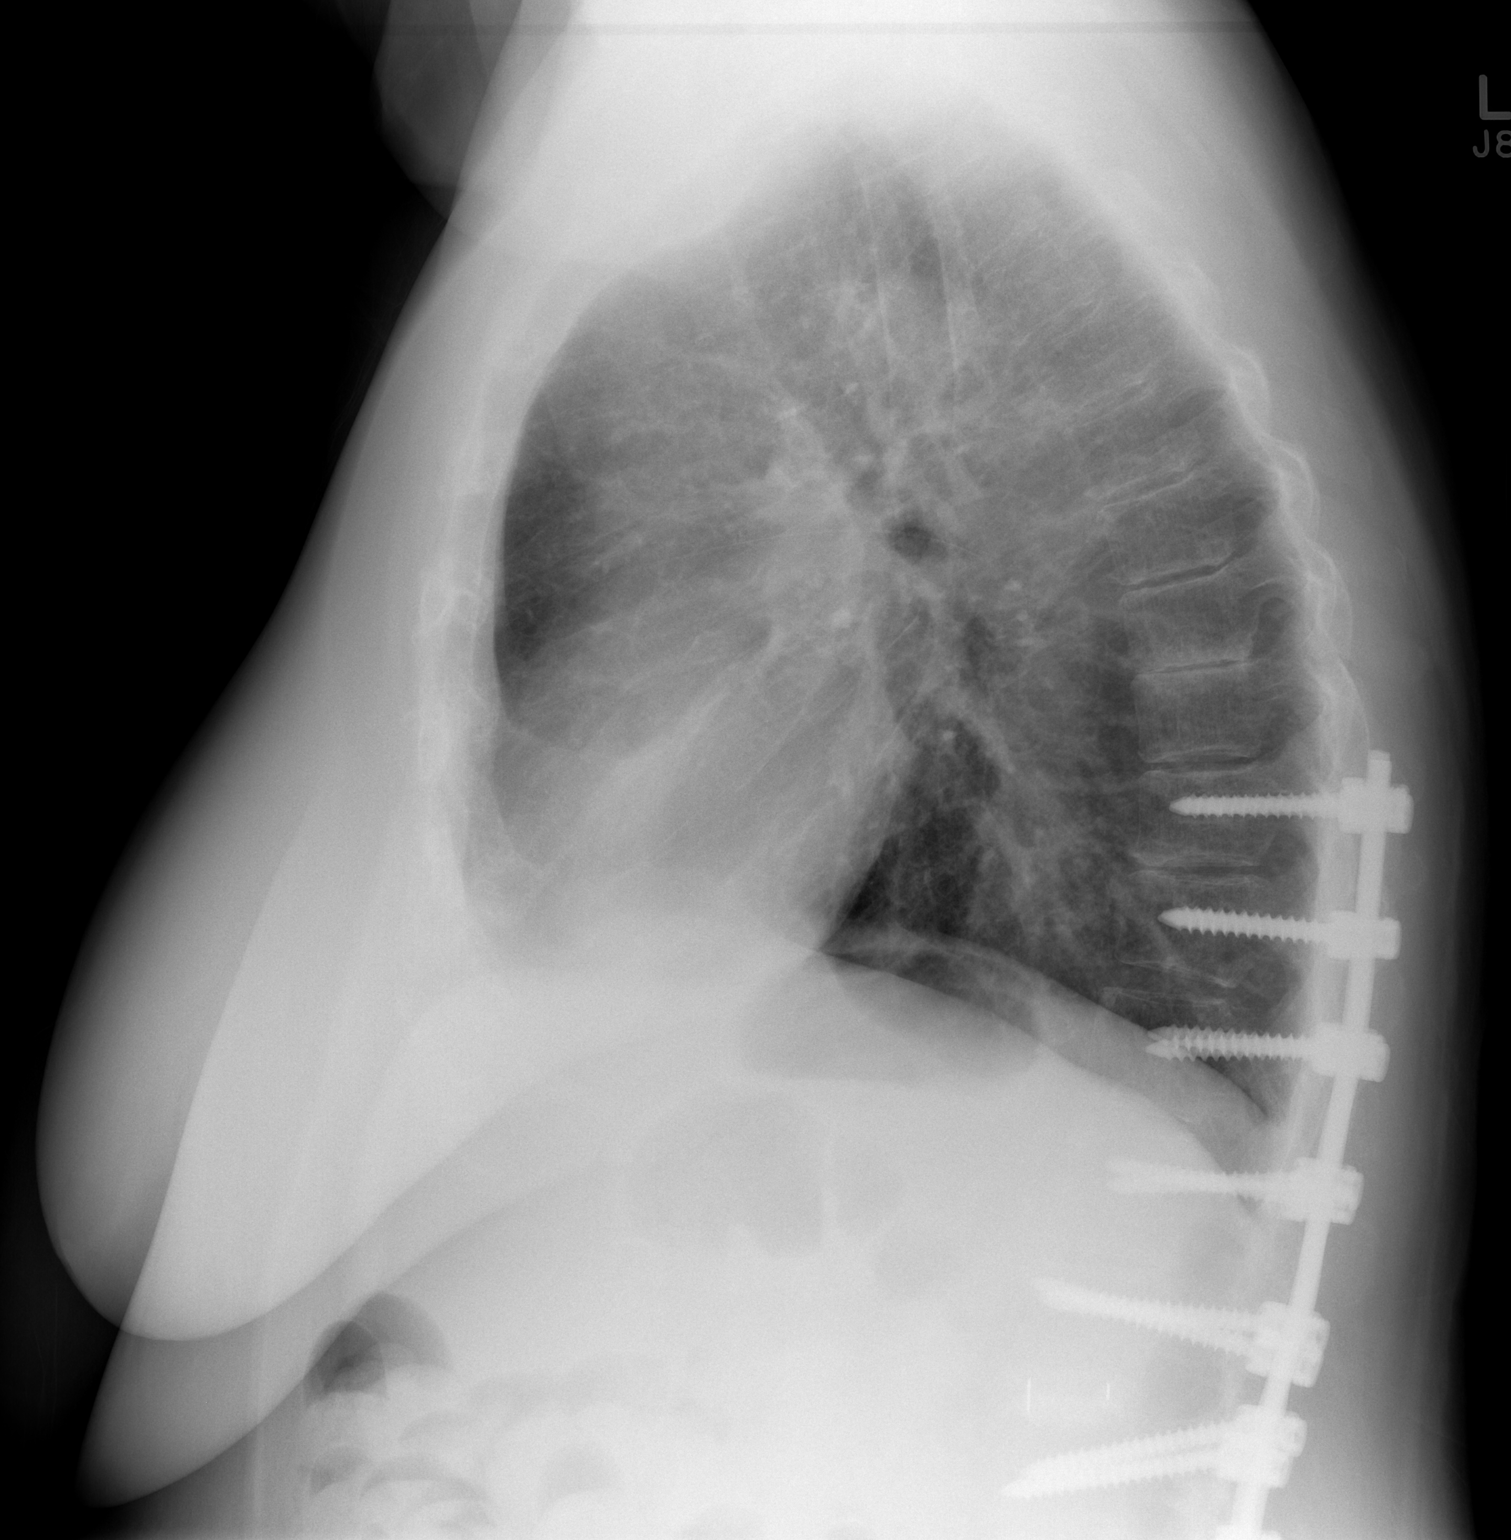

[2 of 2 positions shown; findings below may reference images not displayed]

FINDINGS: Heart size and mediastinal contours are normal.  Both
lungs are clear.  Chronic interstitial prominence is unchanged.  No
evidence of pleural effusion.  Harrington fixation rods are now
seen in the lower thoracic and lumbar spine.
IMPRESSION: No active cardiopulmonary disease.

## 2014-07-20 ENCOUNTER — Encounter: Payer: Self-pay | Admitting: Internal Medicine

## 2014-07-20 ENCOUNTER — Ambulatory Visit (INDEPENDENT_AMBULATORY_CARE_PROVIDER_SITE_OTHER): Payer: Medicare Other | Admitting: Internal Medicine

## 2014-07-20 VITALS — BP 136/80 | HR 88 | Ht 67.0 in | Wt 160.0 lb

## 2014-07-20 DIAGNOSIS — J449 Chronic obstructive pulmonary disease, unspecified: Secondary | ICD-10-CM | POA: Diagnosis not present

## 2014-07-20 DIAGNOSIS — J9601 Acute respiratory failure with hypoxia: Secondary | ICD-10-CM

## 2014-07-20 DIAGNOSIS — J9612 Chronic respiratory failure with hypercapnia: Secondary | ICD-10-CM | POA: Diagnosis not present

## 2014-07-20 MED ORDER — PREDNISONE 10 MG PO TABS: ORAL_TABLET | ORAL | Status: DC

## 2014-07-20 MED ORDER — FLUTTER DEVI: Status: DC

## 2014-07-20 NOTE — Patient Instructions (Addendum)
Use 2lpm at bedtime and as needed   Prednisone 10 mg Take 4 for three days 3 for three days 2 for three days 1 for three days and stop   For cough > mucinex dm 1200 mg every 12 hours  And add flutter valve as much as possible   Plan A =  Automatic = symbicort 160 Take 2 puffs first thing in am and then another 2 puffs about 12 hours later.                                       spriva each am only   Work on inhaler technique:  relax and gently blow all the way out then take a nice smooth deep breath back in, triggering the inhaler at same time you start breathing in.  Hold for up to 5 seconds if you can.  Rinse and gargle with water when done      Plan B = backup Only use your albuterol (proair)as a rescue medication to be used if you can't catch your breath by resting or doing a relaxed purse lip breathing pattern.  - The less you use it, the better it will work when you need it. - Ok to use up to 2 puffs  every 4 hours if you must but call for immediate appointment if use goes up over your usual need - Don't leave home without it !!  (think of it like the spare tire for your car)   Plan C = crisis = albuterol 2.5 every 4 hours if needed if plan B doesn't work (ok to use up your present neb solution  if you want)  See Tammy NP in  2 weeks with all your medications, even over the counter meds, separated in two separate bags, the ones you take no matter what vs the ones you stop once you feel better and take only as needed when you feel you need them.   Tammy  will generate for you a new user friendly medication calendar that will put [place] all on the same page re: your medication use.     Without this process, it simply isn't possible to assure that we are providing  your outpatient care  with  the attention to detail we feel you deserve.   If we cannot assure that you're getting that kind of care,  then we cannot manage your problem effectively from this clinic.  Once you have seen Tammy and  we are sure that we're all on the same page with your medication use she will arrange follow up with me with pfts

## 2014-07-20 NOTE — Progress Notes (Signed)
Subjective:   Patient ID: [name], female    DOB: 11/30/1950     MRN: [id number]    Brief patient profile:  64 yowf long smoking hx ? Asthma as child? But fine in HS onset of variable sob x 2010 on noct 02  And maint spiriva and symbicort  then much worse since Thx 2/015 referred by friend to   pulmonary clinic 04/02/2014 and did not return for pfts then required intubation 06/2014 and quit smoking at that point.     History of Present Illness  04/02/2014 1st [facility] office visit/ Sherene Sires  / on ACEi  Chief Complaint  Patient presents with  . Pulmonary Consult    Self referral. Pt c/o cough and SOB for the past 2 months. Her cough is prod with large amounts of yellow to green sputum.  She states feels SOB all of the time, but worse with any exertion. Her sats were 78% ra at rest--increased to 92%2lpm continuous o2.    Just finished cipro / typically much better in short run anyway p prednisone but not this most last flare. Sob x across the room.  Onset was abrupt at [place], pattern is variable but overall declining in terms of best days >>For now stay on 02 2lpm=  24/7 Prednisone 10 mg Take 4 for three days 3 for three days 2 for three days 1 for three days and stop  Stop lisinopril and start benicar 20 mg one half daily until return and your breathing should gradually improve  For cough > mucinex dm 1200 mg every 12 hours and supplement with tramadol  1 every 4 hours Plan A =  Automatic = symbicort 160 Take 2 puffs first thing in am and then another 2 puffs about 12 hours later.                                       spriva each am only Plan B = backup Only use your albuterol (proair)as a rescue medication Plan C = crisis = albuterol 2.5 every 4 hours if needed if plan B doesn't work (ok to use up your present neb solution  if you want)    04/16/2014 NP Follow up : Cough/ACE and Med Calendar Pt returns for a 2 week follow up and med review  Seen last ov for Cough and  Dypsnea.  Taken off ACE  Seen back in office for acute office visit on 3/3 -tx for bronchitis -tx w/ augmentin   Cough is much better, decreased congestion  Feels better . Swelling is better.  Does complain of left ear pain.  We reviewed all her medications and appears to be taking correctly.  System was down unable to complete med calendar  rec Continue on Symbicort and Spiriva . -rinse after use.  Mucinex DM Twice daily  As needed  Cough/congestion  Great job on not smoking.  Continue  On Benicar  1/2 daily .  Follow med calendar closely and bring to each visit.  follow up Dr. Sherene Sires  In 4 weeks with PFT and As needed  > did not return   Admit date: 07/06/2014 Discharge date: 07/13/2014 Discharge Diagnoses:  Acute Encephalopathy  Opiate Withdrawal Chronic Pain Syndrome  Deconditioning  Acute on Chronic Hypoxemic / Hypercarbic Respiratory Failure COPD  Tobacco Abuse  Pulmonary Edema  Hypotension  Hypertension  Hyponatremia  Pseudohypocalcemia  Hypokalemia Hypomagnesemia  Fever  Leukocytosis      DISCHARGE PLAN BY DIAGNOSIS    Acute Encephalopathy - meningitis ruled out, MRI reassuring. Suspected toxic / metabolic encephalopathy  Opiate Withdrawal - ? Withdrawal, UDS was negative on admission, narcan with no effect.  Chronic Pain Syndrome  Deconditioning   Discharge Plan: Home PT for discharge Arranged for rolling walker and shower stool  Continue current pain regimen, no new narcotics given at time of discharge. Pt reports 6/6 that she has current medication regimen at home.  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 Sherene Sires as scheduled for COPD review / hospital  follow up  Smoking cessation counseling  Resume 2L O2 Q HS  Resume prior bronchodilators, spiriva   Hypotension  Hypertension   Discharge Plan: BP medications changed while inpatient to clonidine with concern for narcotic withdrawal issues. This can be tapered back to prior regimen of amlodipine / benicar per primary MD.   Hyponatremia  Pseudohypocalcemia  Hypokalemia Hypomagnesemia   Discharge Plan: Assess BMP at time of hospital follow up  Fever / Leukocytosis - no acute infectious source identified. Fever/leukocytosis resolved prior to discharge  Discharge Plan: Resolved, Follow up CBC PRN     DISCHARGE SUMMARY  KALECIA HARTNEY is a 64 y.o. y/o female with a PMH of HLD, 2L nocturnal O2 dependent COPD, HTN, and chronic pain syndrome secondary to multiple lumbar surgeries who presented to [facility] ER on 5/30 with altered mental status (drooling, hard to arouse, confused) and mild respiratory acidosis. She was recently discharged from [facility] for a similar episode with acute encephalopathy secondary to aspiration PNA and excessive narcotics (reportedly reduced at discharge).   Initial ER work up was notable for fever of 101, mild leukocytosis of 11.8, negative UA, negative CXR and UDS was negative (even though husband confirmed he was giving medications as prescribed). She also had a recent removal of a stimulator removed 10 days prior to admit (per Dr. Verlan Friends, pain medication MD). She was initially admitted per [facility] for further evaluation. She was scheduled for MRI of lumbar and thoracic spine due to recent surgery for removal of spinal cord stimulator. Prior to MRI, she was noted to be hypertensive to 191/95 and was agitated. She was given antihypertensives as well as  Ativan and later became much less responsive. MRI was attempted but pt began flailing and was unable to lie flat  therefore MRI not obtained. Fever had spiked to 103.2 and pt was intermittently hypoxic and tachypneic; therefore, she was started on BiPAP and transferred to SDU for further evaluation.   In early AM hours of 5/31, pt became more restless but remained altered. BP remained elevated and only significant lab findings were sodium of 121. Lactic acid normal at 0.9. She developed fever to 103. UA and CXR clear. PCCM was consulted for further recs.  She was started on precedex for restlessness / agitation. CT head was assessed and concerning for PRESS. Hopes were that once on precedex, imaging could be obtained. Unfortunately after precedex was started, pt's SBP dropped into 60's. Precedex stopped and pt bolused with IVF's. Follow up MRI of Brain, thoracic and lumbar spine were negative for acute abnormality (see more detailed report below). Given fever and leukocytosis, she underwent an LP to rule out meningitis which had an opening pressure of 29.  The LP was bloody but showed no evidence of acute infection. She was maintained on mechanical ventilation in ICU until 6/03 at which time she was liberated from the vent. EEG 6/1 was negative for seizure activity but showed mod-severe encephalopathy. Her mental status slowly improved. Antibiotics / antivirals were stopped. She was evaluated by PT with recommendations as above. Blood pressure regimen was altered during admission to clonidine out of concern for possible opiate withdrawal. Home medications were held. Patient confirmed prior to discharge that she does have narcotic supply at home. Working diagnosis for metabolic encephalopathy at time of discharge was potential opiate withdrawal. The patient was medically cleared for discharge with plans as above.    STUDIES:  5/30 CXR >> vascular congestion. 5/31 CT >> concern PRESS 5/31 MRI Brain >> No evidence of acute intracranial abnormality Mild chronic small vessel ischemic  disease 5/31 MRI thoracic / lumbar spine >> very mild T4 superior endplate compression fracture, likely subacute to chronic. Mild disc degeneration in the mid and lower thoracic spine without stenosis. Limited evaluation of the lumbar spine due to artifact from extensive posterior fusion. Dorsal epidural fluid collection at the operative levels may represent a postoperative seroma or pseudomeningocele 5/31 LP >> opening pressure of 29, closing pressure 8  6/01 EEG >> abnormal EEG with moderately severe encephalopathy, no evidence of seizure  SIGNIFICANT EVENTS: 5/30 Admitted with AMS, respiratory failure. PCCM consulted later for agitation and tachypnea. 5/31 Intubated, LP, MRI, fever, empiric acyclovir 6/01 Improved neuro status 6/03 Extubated -> moved to floor. Stopped all abx. Seen by PT - recommended home PT with fall precautions and transfer assist and rolling walker 6/04 Walking rin room with walker. Monitor off bp meds. Neurontin started. Dilaudid Given for prn 6/05 Lying almost flat on arrival to room, c/o worsening back pain, sees [facility] who rx's opioids . Patient counseled on pain control    MICRO DATA  5/30 MRSA PCR >> neg 5/30 UC >> neg  5/31 Respiratory culture >> abundant candida  5/31 CSF >> neg  5/30 Blood cultures x2 >> neg   ANTIBIOTICS Vancomycin 5/30 >> 6/2 Cefepime 5/30 >> 6/2 Acyclovir 5/31 >> 6/2   CONSULTS Neurology - Dr. Wyatt Portela  TUBES / LINES L IJ TLC 5/31 >> out OETT 5/31 >> 6/3    Got worse 5 day discharge rx pred/ zpak and some  Better again       07/20/2014 ext transition of care/ post hosp  f/u ov/Wert re: noct 02 dep/ presumed copd / last pred 07/20/14 / maint on symb and spiriva and freq saba  Chief Complaint  Patient presents with  . HFU    Pt states her breathing is much improved, but not quite back to her normal baseline. She has had a sinus infection since she was d/c'ed from hospital- currently taking pred  taper and   confused with med names/ poor technique noted  Baseline only sob with exertion = MMRC 1 but now sob x across the room/ using 02 prn daytime    No obvious day to day or daytime variabilty or assoc excess or purulent sputum  or cp or chest tightness, subjective wheeze overt sinus or hb symptoms. No unusual exp hx or h/o childhood pna/ asthma or knowledge of premature birth.  Sleeping ok on 2lpm without nocturnal  or early am exacerbation  of respiratory  c/o's or need for noct saba. Also denies any obvious fluctuation of symptoms with weather or environmental changes or other aggravating  or alleviating factors except as outlined above   Current Medications, Allergies, Complete Past Medical History, Past Surgical History, Family History, and Social History were reviewed in [employer] record.  ROS  The following are not active complaints unless bolded sore throat, dysphagia, dental problems, itching, sneezing,  nasal congestion or excess/ purulent secretions, ear ache,   fever, chills, sweats, unintended wt loss, pleuritic or exertional cp, hemoptysis,  orthopnea pnd or leg swelling, presyncope, palpitations, heartburn, abdominal pain, anorexia, nausea, vomiting, diarrhea  or change in bowel or urinary habits, change in stools or urine, dysuria,hematuria,  rash, arthralgias, visual complaints, headache, numbness weakness or ataxia or problems with walking or coordination,  change in mood/affect or memory.           Objective:   Physical Exam    amb wf very  Congested/rattling  Cough but otherwise nad  Wt Readings from Last 3 Encounters:  07/20/14 160 lb (72.576 kg)  07/10/14 153 lb 14.1 oz (69.8 kg)  06/02/14 157 lb 6.5 oz (71.4 kg)    Vital signs reviewed    Vital signs reviewed  HEENT: full dentures, turbinates, and orophanx. Nl external ear canals without cough reflex   NECK :  without JVD/Nodes/TM/ nl carotid upstrokes bilaterally   LUNGS: no  acc muscle use,  insp and exp junky rhonchi bilaterally    CV:  RRR  no s3 or murmur or increase in P2, no edema   ABD:  soft and nontender with nl excursion in the supine position. No bruits or organomegaly, bowel sounds nl  MS:  warm without deformities, calf tenderness, cyanosis or clubbing  SKIN: warm and dry without lesions    NEURO:  alert, approp, no deficits     I personally reviewed images and agree with radiology impression as follows:  CXR:  07/09/14 No acute changes         Assessment & Plan:

## 2014-07-21 ENCOUNTER — Encounter: Payer: Self-pay | Admitting: Internal Medicine

## 2014-07-21 NOTE — Assessment & Plan Note (Addendum)
-   trial off acei 04/02/2014   Clearly very severe flare resulting in intubation but only off cigs for a few weeks and needs pfts to verify dx/ staging but did not return the last time we rec it.  DDX of  difficult airways management all start with A and  include Adherence, Ace Inhibitors, Acid Reflux, Active Sinus Disease, Alpha 1 Antitripsin deficiency, Anxiety masquerading as Airways dz,  ABPA,  allergy(esp in young), Aspiration (esp in elderly), Adverse effects of meds,  Active smokers, A bunch of PE's (a small clot burden can't cause this syndrome unless there is already severe underlying pulm or vascular dz with poor reserve) plus two Bs  = Bronchiectasis and Beta blocker use..and one C= CHF  Adherence is always the initial "prime suspect" and is a multilayered concern that requires a "trust but verify" approach in every patient - starting with knowing how to use medications, especially inhalers, correctly, keeping up with refills and understanding the fundamental difference between maintenance and prns vs those medications only taken for a very short course and then stopped and not refilled.  - The proper method of use, as well as anticipated side effects, of a metered-dose inhaler are discussed and demonstrated to the patient. 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  Please see instructions for details which were reviewed in writing and the patient given a copy.    She clearly has life threatening dz and needs to take "ownership" of her problem and learn how to use her meds.  - To keep things simple, I have asked the patient to first separate medicines that are perceived as maintenance, that is to be taken daily "no matter what", from those medicines that are taken on only on an as-needed basis and I have given the patient examples of both, and then return to see our NP to generate a  detailed  medication calendar which should be followed until the next physician sees the patient and updates it.

## 2014-07-21 NOTE — Assessment & Plan Note (Addendum)
02 sats at rest 78% RA 04/02/2014  Top 92 % on 2lpm - HC03  35 on 07/11/14  - 07/20/2014   Walked RA x one lap @ 185 stopped due to  Sob/ no desats     rec use 2lpm at hs and prn daytime

## 2014-07-24 LAB — BENZODIAZEPINES,MS,WB/SP RFX
7-AMINOCLONAZEPAM: NEGATIVE ng/mL
Alprazolam: 2.1 ng/mL
Benzodiazepines Confirm: POSITIVE
CHLORDIAZEPOXIDE: 88 ng/mL
CLONAZEPAM: NEGATIVE ng/mL
DESMETHYLDIAZEPAM: 107 ng/mL
Desalkylflurazepam: NEGATIVE ng/mL
Desmethylchlordiazepoxide: NEGATIVE ng/mL
Diazepam: NEGATIVE ng/mL
Flurazepam: NEGATIVE ng/mL
Lorazepam: NEGATIVE ng/mL
MIDAZOLAM: NEGATIVE ng/mL
Oxazepam: NEGATIVE ng/mL
TEMAZEPAM: NEGATIVE ng/mL
TRIAZOLAM: NEGATIVE ng/mL

## 2014-07-24 LAB — DRUG SCREEN 10 W/CONF, SERUM
Amphetamines, IA: NEGATIVE ng/mL
Barbiturates, IA: NEGATIVE ug/mL
Benzodiazepines, IA: POSITIVE ng/mL
COCAINE & METABOLITE, IA: NEGATIVE ng/mL
METHADONE, IA: NEGATIVE ng/mL
OPIATES, IA: NEGATIVE ng/mL
Oxycodones, IA: NEGATIVE ng/mL
Phencyclidine, IA: NEGATIVE ng/mL
Propoxyphene, IA: NEGATIVE ng/mL
THC(MARIJUANA) METABOLITE, IA: NEGATIVE ng/mL

## 2014-07-30 ENCOUNTER — Other Ambulatory Visit: Payer: Self-pay | Admitting: Orthopedic Surgery

## 2014-07-30 DIAGNOSIS — R52 Pain, unspecified: Secondary | ICD-10-CM

## 2014-08-14 ENCOUNTER — Encounter: Payer: Self-pay | Admitting: Adult Health

## 2014-08-14 ENCOUNTER — Ambulatory Visit
Admission: RE | Admit: 2014-08-14 | Discharge: 2014-08-14 | Disposition: A | Discharge status: Home / Self Care | Payer: Medicare Other | Source: Ambulatory Visit | Attending: Orthopedic Surgery | Admitting: Orthopedic Surgery

## 2014-08-14 ENCOUNTER — Ambulatory Visit (INDEPENDENT_AMBULATORY_CARE_PROVIDER_SITE_OTHER): Payer: Medicare Other | Admitting: Adult Health

## 2014-08-14 VITALS — BP 150/98 | HR 81 | Temp 98.0°F | Ht 67.0 in | Wt 160.0 lb

## 2014-08-14 DIAGNOSIS — R52 Pain, unspecified: Secondary | ICD-10-CM

## 2014-08-14 DIAGNOSIS — J449 Chronic obstructive pulmonary disease, unspecified: Secondary | ICD-10-CM | POA: Diagnosis not present

## 2014-08-14 NOTE — Patient Instructions (Addendum)
Continue on Symbicort and Spiriva . -rinse after use.  Mucinex DM Twice daily  As needed  Cough/congestion  Work on on not smoking.   Follow med calendar closely and bring to each visit.  follow up Dr. Sherene SiresWert  In 4 weeks with PFT and As needed   Please contact office for sooner follow up if symptoms do not improve or worsen or seek emergency care

## 2014-08-14 NOTE — Progress Notes (Signed)
Subjective:   Patient ID: [name], female    DOB: 11/30/1950     MRN: [id number]    Brief patient profile:  64 yowf long smoking hx ? Asthma as child? But fine in HS onset of variable sob x 2010 on noct 02  And maint spiriva and symbicort  then much worse since Thx 2/015 referred by friend to   pulmonary clinic 04/02/2014 and did not return for pfts then required intubation 06/2014 and quit smoking at that point.     History of Present Illness  04/02/2014 1st [facility] office visit/ Sherene Sires  / on ACEi  Chief Complaint  Patient presents with  . Pulmonary Consult    Self referral. Pt c/o cough and SOB for the past 2 months. Her cough is prod with large amounts of yellow to green sputum.  She states feels SOB all of the time, but worse with any exertion. Her sats were 78% ra at rest--increased to 92%2lpm continuous o2.    Just finished cipro / typically much better in short run anyway p prednisone but not this most last flare. Sob x across the room.  Onset was abrupt at [place], pattern is variable but overall declining in terms of best days >>For now stay on 02 2lpm=  24/7 Prednisone 10 mg Take 4 for three days 3 for three days 2 for three days 1 for three days and stop  Stop lisinopril and start benicar 20 mg one half daily until return and your breathing should gradually improve  For cough > mucinex dm 1200 mg every 12 hours and supplement with tramadol  1 every 4 hours Plan A =  Automatic = symbicort 160 Take 2 puffs first thing in am and then another 2 puffs about 12 hours later.                                       spriva each am only Plan B = backup Only use your albuterol (proair)as a rescue medication Plan C = crisis = albuterol 2.5 every 4 hours if needed if plan B doesn't work (ok to use up your present neb solution  if you want)    04/16/2014 NP Follow up : Cough/ACE and Med Calendar Pt returns for a 2 week follow up and med review  Seen last ov for Cough and  Dypsnea.  Taken off ACE  Seen back in office for acute office visit on 3/3 -tx for bronchitis -tx w/ augmentin   Cough is much better, decreased congestion  Feels better . Swelling is better.  Does complain of left ear pain.  We reviewed all her medications and appears to be taking correctly.  System was down unable to complete med calendar  rec Continue on Symbicort and Spiriva . -rinse after use.  Mucinex DM Twice daily  As needed  Cough/congestion  Great job on not smoking.  Continue  On Benicar  1/2 daily .  Follow med calendar closely and bring to each visit.  follow up Dr. Sherene Sires  In 4 weeks with PFT and As needed  > did not return   Admit date: 07/06/2014 Discharge date: 07/13/2014 Discharge Diagnoses:  Acute Encephalopathy  Opiate Withdrawal Chronic Pain Syndrome  Deconditioning  Acute on Chronic Hypoxemic / Hypercarbic Respiratory Failure COPD  Tobacco Abuse  Pulmonary Edema  Hypotension  Hypertension  Hyponatremia  Pseudohypocalcemia  Hypokalemia Hypomagnesemia  Fever  Leukocytosis      DISCHARGE PLAN BY DIAGNOSIS    Acute Encephalopathy - meningitis ruled out, MRI reassuring. Suspected toxic / metabolic encephalopathy  Opiate Withdrawal - ? Withdrawal, UDS was negative on admission, narcan with no effect.  Chronic Pain Syndrome  Deconditioning   Discharge Plan: Home PT for discharge Arranged for rolling walker and shower stool  Continue current pain regimen, no new narcotics given at time of discharge. Pt reports 6/6 that she has current medication regimen at home.  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 Sherene Sires as scheduled for COPD review / hospital  follow up  Smoking cessation counseling  Resume 2L O2 Q HS  Resume prior bronchodilators, spiriva   Hypotension  Hypertension   Discharge Plan: BP medications changed while inpatient to clonidine with concern for narcotic withdrawal issues. This can be tapered back to prior regimen of amlodipine / benicar per primary MD.   Hyponatremia  Pseudohypocalcemia  Hypokalemia Hypomagnesemia   Discharge Plan: Assess BMP at time of hospital follow up  Fever / Leukocytosis - no acute infectious source identified. Fever/leukocytosis resolved prior to discharge  Discharge Plan: Resolved, Follow up CBC PRN     DISCHARGE SUMMARY  [name] is a 64 y.o. y/o female with a PMH of HLD, 2L nocturnal O2 dependent COPD, HTN, and chronic pain syndrome secondary to multiple lumbar surgeries who presented to [facility] ER on 5/30 with altered mental status (drooling, hard to arouse, confused) and mild respiratory acidosis. She was recently discharged from [facility] for a similar episode with acute encephalopathy secondary to aspiration PNA and excessive narcotics (reportedly reduced at discharge).   Initial ER work up was notable for fever of 101, mild leukocytosis of 11.8, negative UA, negative CXR and UDS was negative (even though husband confirmed he was giving medications as prescribed). She also had a recent removal of a stimulator removed 10 days prior to admit (per Dr. Verlan Friends, pain medication MD). She was initially admitted per [facility] for further evaluation. She was scheduled for MRI of lumbar and thoracic spine due to recent surgery for removal of spinal cord stimulator. Prior to MRI, she was noted to be hypertensive to 191/95 and was agitated. She was given antihypertensives as well as  Ativan and later became much less responsive. MRI was attempted but pt began flailing and was unable to lie flat  therefore MRI not obtained. Fever had spiked to 103.2 and pt was intermittently hypoxic and tachypneic; therefore, she was started on BiPAP and transferred to SDU for further evaluation.   In early AM hours of 5/31, pt became more restless but remained altered. BP remained elevated and only significant lab findings were sodium of 121. Lactic acid normal at 0.9. She developed fever to 103. UA and CXR clear. PCCM was consulted for further recs.  She was started on precedex for restlessness / agitation. CT head was assessed and concerning for PRESS. Hopes were that once on precedex, imaging could be obtained. Unfortunately after precedex was started, pt's SBP dropped into 60's. Precedex stopped and pt bolused with IVF's. Follow up MRI of Brain, thoracic and lumbar spine were negative for acute abnormality (see more detailed report below). Given fever and leukocytosis, she underwent an LP to rule out meningitis which had an opening pressure of 29.  The LP was bloody but showed no evidence of acute infection. She was maintained on mechanical ventilation in ICU until 6/03 at which time she was liberated from the vent. EEG 6/1 was negative for seizure activity but showed mod-severe encephalopathy. Her mental status slowly improved. Antibiotics / antivirals were stopped. She was evaluated by PT with recommendations as above. Blood pressure regimen was altered during admission to clonidine out of concern for possible opiate withdrawal. Home medications were held. Patient confirmed prior to discharge that she does have narcotic supply at home. Working diagnosis for metabolic encephalopathy at time of discharge was potential opiate withdrawal. The patient was medically cleared for discharge with plans as above.    STUDIES:  5/30 CXR >> vascular congestion. 5/31 CT >> concern PRESS 5/31 MRI Brain >> No evidence of acute intracranial abnormality Mild chronic small vessel ischemic  disease 5/31 MRI thoracic / lumbar spine >> very mild T4 superior endplate compression fracture, likely subacute to chronic. Mild disc degeneration in the mid and lower thoracic spine without stenosis. Limited evaluation of the lumbar spine due to artifact from extensive posterior fusion. Dorsal epidural fluid collection at the operative levels may represent a postoperative seroma or pseudomeningocele 5/31 LP >> opening pressure of 29, closing pressure 8  6/01 EEG >> abnormal EEG with moderately severe encephalopathy, no evidence of seizure  SIGNIFICANT EVENTS: 5/30 Admitted with AMS, respiratory failure. PCCM consulted later for agitation and tachypnea. 5/31 Intubated, LP, MRI, fever, empiric acyclovir 6/01 Improved neuro status 6/03 Extubated -> moved to floor. Stopped all abx. Seen by PT - recommended home PT with fall precautions and transfer assist and rolling walker 6/04 Walking rin room with walker. Monitor off bp meds. Neurontin started. Dilaudid Given for prn 6/05 Lying almost flat on arrival to room, c/o worsening back pain, sees [facility] who rx's opioids . Patient counseled on pain control    MICRO DATA  5/30 MRSA PCR >> neg 5/30 UC >> neg  5/31 Respiratory culture >> abundant candida  5/31 CSF >> neg  5/30 Blood cultures x2 >> neg   ANTIBIOTICS Vancomycin 5/30 >> 6/2 Cefepime 5/30 >> 6/2 Acyclovir 5/31 >> 6/2   CONSULTS Neurology - Dr. Wyatt Portela  TUBES / LINES L IJ TLC 5/31 >> out OETT 5/31 >> 6/3    Got worse 5 day discharge rx pred/ zpak and some  Better again       07/20/2014 ext transition of care/ post hosp  f/u ov/Wert re: noct 02 dep/ presumed copd / last pred 07/20/14 / maint on symb and spiriva and freq saba  Chief Complaint  Patient presents with  . HFU    Pt states her breathing is much improved, but not quite back to her normal baseline. She has had a sinus infection since she was d/c'ed from hospital- currently taking pred  taper and   confused with med names/ poor technique noted  Baseline only sob with exertion = MMRC 1 but now sob x across the room/ using 02 prn daytime  >pred taper     08/14/2014 Follow up : Presumed COPD-PFT pending  Returns for follow up  Last ov with slow to resolve AECOPD . tx w/ pred taper  She is feeling better.    Pt states breathing is doing well and has no new complaints.  reveiwed all her meds and organized them into a med calendar with pt education  Appears to be correct.  Denies chest pain , orthopnea, edema or fever.  Current Medications, Allergies, Complete Past Medical History, Past Surgical History, Family History, and Social History were reviewed in [facility] electronic medical record.  ROS  The following are not active complaints unless bolded sore throat, dysphagia, dental problems, itching, sneezing,  nasal congestion or excess/ purulent secretions, ear ache,   fever, chills, sweats, unintended wt loss, pleuritic or exertional cp, hemoptysis,  orthopnea pnd or leg swelling, presyncope, palpitations, heartburn, abdominal pain, anorexia, nausea, vomiting, diarrhea  or change in bowel or urinary habits, change in stools or urine, dysuria,hematuria,  rash, arthralgias, visual complaints, headache, numbness weakness or ataxia or problems with walking or coordination,  change in mood/affect or memory.           Objective:   Physical Exam    amb wf     Vital signs reviewed  HEENT: full dentures, turbinates, and orophanx. Nl external ear canals without cough reflex   NECK :  without JVD/Nodes/TM/ nl carotid upstrokes bilaterally   LUNGS: no acc muscle use,decreased BS in bases    CV:  RRR  no s3 or murmur or increase in P2, no edema   ABD:  soft and nontender with nl excursion in the supine position. No bruits or organomegaly, bowel sounds nl  MS:  warm without deformities, calf tenderness, cyanosis or clubbing  SKIN: warm and dry without lesions     NEURO:  alert, approp, no deficits       CXR:  07/09/14 No acute changes         Assessment & Plan:

## 2014-08-20 NOTE — Assessment & Plan Note (Signed)
Continue on Symbicort and Spiriva . -rinse after use.  Mucinex DM Twice daily  As needed  Cough/congestion  Work on on not smoking.   Follow med calendar closely and bring to each visit.  follow up Dr. Sherene SiresWert  In 4 weeks with PFT and As needed   Please contact office for sooner follow up if symptoms do not improve or worsen or seek emergency care

## 2014-09-29 ENCOUNTER — Ambulatory Visit: Payer: Medicare Other | Admitting: Internal Medicine

## 2015-04-01 ENCOUNTER — Emergency Department (HOSPITAL_COMMUNITY)
Admission: EM | Admit: 2015-04-01 | Discharge: 2015-04-01 | Disposition: A | Discharge status: Home / Self Care | Payer: Medicare Other | Attending: Emergency Medicine | Admitting: Emergency Medicine

## 2015-04-01 ENCOUNTER — Emergency Department (HOSPITAL_COMMUNITY): Payer: Medicare Other

## 2015-04-01 ENCOUNTER — Encounter (HOSPITAL_COMMUNITY): Payer: Self-pay | Admitting: Emergency Medicine

## 2015-04-01 DIAGNOSIS — J439 Emphysema, unspecified: Secondary | ICD-10-CM | POA: Insufficient documentation

## 2015-04-01 DIAGNOSIS — R4182 Altered mental status, unspecified: Secondary | ICD-10-CM | POA: Insufficient documentation

## 2015-04-01 DIAGNOSIS — E785 Hyperlipidemia, unspecified: Secondary | ICD-10-CM | POA: Insufficient documentation

## 2015-04-01 DIAGNOSIS — Z87440 Personal history of urinary (tract) infections: Secondary | ICD-10-CM | POA: Insufficient documentation

## 2015-04-01 DIAGNOSIS — Z8669 Personal history of other diseases of the nervous system and sense organs: Secondary | ICD-10-CM | POA: Diagnosis not present

## 2015-04-01 DIAGNOSIS — I10 Essential (primary) hypertension: Secondary | ICD-10-CM | POA: Insufficient documentation

## 2015-04-01 DIAGNOSIS — Z79899 Other long term (current) drug therapy: Secondary | ICD-10-CM | POA: Insufficient documentation

## 2015-04-01 DIAGNOSIS — R197 Diarrhea, unspecified: Secondary | ICD-10-CM

## 2015-04-01 DIAGNOSIS — Z87891 Personal history of nicotine dependence: Secondary | ICD-10-CM | POA: Diagnosis not present

## 2015-04-01 DIAGNOSIS — Z79891 Long term (current) use of opiate analgesic: Secondary | ICD-10-CM | POA: Diagnosis not present

## 2015-04-01 DIAGNOSIS — Z7982 Long term (current) use of aspirin: Secondary | ICD-10-CM | POA: Insufficient documentation

## 2015-04-01 DIAGNOSIS — Z7951 Long term (current) use of inhaled steroids: Secondary | ICD-10-CM | POA: Diagnosis not present

## 2015-04-01 LAB — COMPREHENSIVE METABOLIC PANEL
ALT: 16 U/L (ref 14–54)
AST: 22 U/L (ref 15–41)
Albumin: 3.9 g/dL (ref 3.5–5.0)
Alkaline Phosphatase: 51 U/L (ref 38–126)
Anion gap: 9 (ref 5–15)
BUN: 18 mg/dL (ref 6–20)
CHLORIDE: 97 mmol/L — AB (ref 101–111)
CO2: 25 mmol/L (ref 22–32)
Calcium: 8.8 mg/dL — ABNORMAL LOW (ref 8.9–10.3)
Creatinine, Ser: 0.63 mg/dL (ref 0.44–1.00)
GFR calc Af Amer: >60 mL/min (ref >60)
Glucose, Bld: 99 mg/dL (ref 65–99)
POTASSIUM: 3.4 mmol/L — AB (ref 3.5–5.1)
Sodium: 131 mmol/L — ABNORMAL LOW (ref 135–145)
Total Bilirubin: 0.7 mg/dL (ref 0.3–1.2)
Total Protein: 6.3 g/dL — ABNORMAL LOW (ref 6.5–8.1)

## 2015-04-01 LAB — URINALYSIS, ROUTINE W REFLEX MICROSCOPIC
Bilirubin Urine: NEGATIVE
GLUCOSE, UA: NEGATIVE mg/dL
Ketones, ur: NEGATIVE mg/dL
Nitrite: NEGATIVE
PH: 7 (ref 5.0–8.0)
Protein, ur: >300 mg/dL — AB
SPECIFIC GRAVITY, URINE: 1.018 (ref 1.005–1.030)

## 2015-04-01 LAB — CBC
HEMATOCRIT: 43.6 % (ref 36.0–46.0)
Hemoglobin: 14.4 g/dL (ref 12.0–15.0)
MCH: 30.3 pg (ref 26.0–34.0)
MCHC: 33 g/dL (ref 30.0–36.0)
MCV: 91.8 fL (ref 78.0–100.0)
Platelets: 246 10*3/uL (ref 150–400)
RBC: 4.75 MIL/uL (ref 3.87–5.11)
RDW: 13.5 % (ref 11.5–15.5)
WBC: 29.5 10*3/uL — AB (ref 4.0–10.5)

## 2015-04-01 LAB — URINE MICROSCOPIC-ADD ON
Bacteria, UA: NONE SEEN
Squamous Epithelial / LPF: NONE SEEN

## 2015-04-01 LAB — I-STAT CG4 LACTIC ACID, ED
LACTIC ACID, VENOUS: 0.91 mmol/L (ref 0.5–2.0)
Lactic Acid, Venous: 1.05 mmol/L (ref 0.5–2.0)
Lactic Acid, Venous: 0.81 mmol/L (ref 0.5–2.0)

## 2015-04-01 LAB — CBG MONITORING, ED: Glucose-Capillary: 101 mg/dL — ABNORMAL HIGH (ref 65–99)

## 2015-04-01 LAB — LIPASE, BLOOD: LIPASE: 17 U/L (ref 11–51)

## 2015-04-01 MED ORDER — SODIUM CHLORIDE 0.9 % IV BOLUS (SEPSIS)
1000.0000 mL | INTRAVENOUS | Status: AC
  Administered 2015-04-01: 1000 mL via INTRAVENOUS

## 2015-04-01 MED ORDER — OXYCODONE-ACETAMINOPHEN 5-325 MG PO TABS
2.0000 | ORAL_TABLET | Freq: Once | ORAL | Status: AC
  Administered 2015-04-01: 2 via ORAL
  Filled 2015-04-01: qty 2

## 2015-04-01 MED ORDER — SODIUM CHLORIDE 0.9 % IV BOLUS (SEPSIS)
500.0000 mL | INTRAVENOUS | Status: AC
  Administered 2015-04-01: 500 mL via INTRAVENOUS

## 2015-04-01 NOTE — ED Notes (Signed)
Per family, states she was walking around the house using the bathroom on self-states diarrhea last night, early this am-OX3-patient lethargic, O2@ 89% on room air-husband states BP and HR was elevated and

## 2015-04-01 NOTE — Discharge Instructions (Signed)
Drink an extra quarter to water each day, while you are sick. Use Imodium, or Kaopectate as needed for diarrhea.   Diarrhea Diarrhea is frequent loose and watery bowel movements. It can cause you to feel weak and dehydrated. Dehydration can cause you to become tired and thirsty, have a dry mouth, and have decreased urination that often is dark yellow. Diarrhea is a sign of another problem, most often an infection that will not last long. In most cases, diarrhea typically lasts 2-3 days. However, it can last longer if it is a sign of something more serious. It is important to treat your diarrhea as directed by your caregiver to lessen or prevent future episodes of diarrhea. CAUSES  Some common causes include:  Gastrointestinal infections caused by viruses, bacteria, or parasites.  Food poisoning or food allergies.  Certain medicines, such as antibiotics, chemotherapy, and laxatives.  Artificial sweeteners and fructose.  Digestive disorders. HOME CARE INSTRUCTIONS  Ensure adequate fluid intake (hydration): Have 1 cup (8 oz) of fluid for each diarrhea episode. Avoid fluids that contain simple sugars or sports drinks, fruit juices, whole milk products, and sodas. Your urine should be clear or pale yellow if you are drinking enough fluids. Hydrate with an oral rehydration solution that you can purchase at pharmacies, retail stores, and online. You can prepare an oral rehydration solution at home by mixing the following ingredients together:   - tsp table salt.   tsp baking soda.   tsp salt substitute containing potassium chloride.  1  tablespoons sugar.  1 L (34 oz) of water.  Certain foods and beverages may increase the speed at which food moves through the gastrointestinal (GI) tract. These foods and beverages should be avoided and include:  Caffeinated and alcoholic beverages.  High-fiber foods, such as raw fruits and vegetables, nuts, seeds, and whole grain breads and  cereals.  Foods and beverages sweetened with sugar alcohols, such as xylitol, sorbitol, and mannitol.  Some foods may be well tolerated and may help thicken stool including:  Starchy foods, such as rice, toast, pasta, low-sugar cereal, oatmeal, grits, baked potatoes, crackers, and bagels.  Bananas.  Applesauce.  Add probiotic-rich foods to help increase healthy bacteria in the GI tract, such as yogurt and fermented milk products.  Wash your hands well after each diarrhea episode.  Only take over-the-counter or prescription medicines as directed by your caregiver.  Take a warm bath to relieve any burning or pain from frequent diarrhea episodes. SEEK IMMEDIATE MEDICAL CARE IF:   You are unable to keep fluids down.  You have persistent vomiting.  You have blood in your stool, or your stools are black and tarry.  You do not urinate in 6-8 hours, or there is only a small amount of very dark urine.  You have abdominal pain that increases or localizes.  You have weakness, dizziness, confusion, or light-headedness.  You have a severe headache.  Your diarrhea gets worse or does not get better.  You have a fever or persistent symptoms for more than 2-3 days.  You have a fever and your symptoms suddenly get worse. MAKE SURE YOU:   Understand these instructions.  Will watch your condition.  Will get help right away if you are not doing well or get worse.   This information is not intended to replace advice given to you by your health care provider. Make sure you discuss any questions you have with your health care provider.   Document Released: 01/13/2002 Document Revised: 02/13/2014  Document Reviewed: 10/01/2011 Elsevier Interactive Patient Education 2016 [employer].  Food Choices to Help Relieve Diarrhea, Adult When you have diarrhea, the foods you eat and your eating habits are very important. Choosing the right foods and drinks can help relieve diarrhea. Also, because  diarrhea can last up to 7 days, you need to replace lost fluids and electrolytes (such as sodium, potassium, and chloride) in order to help prevent dehydration.  WHAT GENERAL GUIDELINES DO I NEED TO FOLLOW?  Slowly drink 1 cup (8 oz) of fluid for each episode of diarrhea. If you are getting enough fluid, your urine will be clear or pale yellow.  Eat starchy foods. Some good choices include white rice, white toast, pasta, low-fiber cereal, baked potatoes (without the skin), saltine crackers, and bagels.  Avoid large servings of any cooked vegetables.  Limit fruit to two servings per day. A serving is  cup or 1 small piece.  Choose foods with less than 2 g of fiber per serving.  Limit fats to less than 8 tsp (38 g) per day.  Avoid fried foods.  Eat foods that have probiotics in them. Probiotics can be found in certain dairy products.  Avoid foods and beverages that may increase the speed at which food moves through the stomach and intestines (gastrointestinal tract). Things to avoid include:  High-fiber foods, such as dried fruit, raw fruits and vegetables, nuts, seeds, and whole grain foods.  Spicy foods and high-fat foods.  Foods and beverages sweetened with high-fructose corn syrup, honey, or sugar alcohols such as xylitol, sorbitol, and mannitol. WHAT FOODS ARE RECOMMENDED? Grains White rice. White, [place], or pita breads (fresh or toasted), including plain rolls, buns, or bagels. White pasta. Saltine, soda, or graham crackers. Pretzels. Low-fiber cereal. Cooked cereals made with water (such as cornmeal, farina, or cream cereals). Plain muffins. Matzo. Melba toast. Zwieback.  Vegetables Potatoes (without the skin). Strained tomato and vegetable juices. Most well-cooked and canned vegetables without seeds. Tender lettuce. Fruits Cooked or canned applesauce, apricots, cherries, fruit cocktail, grapefruit, peaches, pears, or plums. Fresh bananas, apples without skin, cherries, grapes,  cantaloupe, grapefruit, peaches, oranges, or plums.  Meat and Other Protein Products Baked or boiled chicken. Eggs. Tofu. Fish. Seafood. Smooth peanut butter. Ground or well-cooked tender beef, ham, veal, lamb, pork, or poultry.  Dairy Plain yogurt, kefir, and unsweetened liquid yogurt. Lactose-free milk, buttermilk, or soy milk. Plain hard cheese. Beverages Sport drinks. Clear broths. Diluted fruit juices (except prune). Regular, caffeine-free sodas such as ginger ale. Water. Decaffeinated teas. Oral rehydration solutions. Sugar-free beverages not sweetened with sugar alcohols. Other Bouillon, broth, or soups made from recommended foods.  The items listed above may not be a complete list of recommended foods or beverages. Contact your dietitian for more options. WHAT FOODS ARE NOT RECOMMENDED? Grains Whole grain, whole wheat, bran, or rye breads, rolls, pastas, crackers, and cereals. Wild or brown rice. Cereals that contain more than 2 g of fiber per serving. Corn tortillas or taco shells. Cooked or dry oatmeal. Granola. Popcorn. Vegetables Raw vegetables. Cabbage, broccoli, Brussels sprouts, artichokes, baked beans, beet greens, corn, kale, legumes, peas, sweet potatoes, and yams. Potato skins. Cooked spinach and cabbage. Fruits Dried fruit, including raisins and dates. Raw fruits. Stewed or dried prunes. Fresh apples with skin, apricots, mangoes, pears, raspberries, and strawberries.  Meat and Other Protein Products Chunky peanut butter. Nuts and seeds. Beans and lentils. Tomasa Blase.  Dairy High-fat cheeses. Milk, chocolate milk, and beverages made with milk, such as milk shakes.  Cream. Ice cream. Sweets and Desserts Sweet rolls, doughnuts, and sweet breads. Pancakes and waffles. Fats and Oils Butter. Cream sauces. Margarine. Salad oils. Plain salad dressings. Olives. Avocados.  Beverages Caffeinated beverages (such as coffee, tea, soda, or energy drinks). Alcoholic beverages. Fruit juices  with pulp. Prune juice. Soft drinks sweetened with high-fructose corn syrup or sugar alcohols. Other Coconut. Hot sauce. Chili powder. Mayonnaise. Gravy. Cream-based or milk-based soups.  The items listed above may not be a complete list of foods and beverages to avoid. Contact your dietitian for more information. WHAT SHOULD I DO IF I BECOME DEHYDRATED? Diarrhea can sometimes lead to dehydration. Signs of dehydration include dark urine and dry mouth and skin. If you think you are dehydrated, you should rehydrate with an oral rehydration solution. These solutions can be purchased at pharmacies, retail stores, or online.  Drink -1 cup (120-240 mL) of oral rehydration solution each time you have an episode of diarrhea. If drinking this amount makes your diarrhea worse, try drinking smaller amounts more often. For example, drink 1-3 tsp (5-15 mL) every 5-10 minutes.  A general rule for staying hydrated is to drink 1-2 L of fluid per day. Talk to your health care provider about the specific amount you should be drinking each day. Drink enough fluids to keep your urine clear or pale yellow.   This information is not intended to replace advice given to you by your health care provider. Make sure you discuss any questions you have with your health care provider.   Document Released: 04/15/2003 Document Revised: 02/13/2014 Document Reviewed: 12/16/2012 Elsevier Interactive Patient Education [employer]

## 2015-04-01 NOTE — ED Notes (Signed)
MD wentz aware of the patients time from start of Code Sepsis. At this time do to the Diarrhea the patient is not going to have IV antibiotics due to questionable Cdiff. Patient remains stable.

## 2015-04-01 NOTE — ED Notes (Addendum)
Per Husband, states she ate out yesterday and has not felt well since-husband states he regulates her narcotics and she has not taken more than perscribed

## 2015-04-01 NOTE — ED Provider Notes (Signed)
CSN: [id number]     Arrival date & time 04/01/15  4098 History   First MD Initiated Contact with Patient 04/01/15 1022     Chief Complaint  Patient presents with  . AMS-      (Consider location/radiation/quality/duration/timing/severity/associated sxs/prior Treatment) HPI  [name] is a 65 y.o. female who presents for evaluation of diarrhea and confusion, which started during the night. Patient is normally lucid. Yesterday, she ate some BBQ, and after that, she complained of stomach discomfort. No other preceding symptoms. No known fever. No known sick contacts.  Level V Caveat- altered mental status     Past Medical History  Diagnosis Date  . COPD (chronic obstructive pulmonary disease) (HCC)   . Emphysema   . Bronchitis   . Hypertension   . Asthma   . Hyperlipidemia   . Shortness of breath dyspnea   . Encephalopathy 06/2014  . UTI (urinary tract infection)    Past Surgical History  Procedure Laterality Date  . Back surgery    . Abdominal hysterectomy    . Appendectomy    . Insertion / placement / revision neurostimulator    . Removal of neurostimulator  06/2014   Family History  Problem Relation Age of Onset  . Emphysema Sister     smoked  . Heart disease Sister   . Ovarian cancer Sister    Social History  Substance Use Topics  . Smoking status: Former Smoker -- 1.50 packs/day for 30 years    Types: Cigarettes    Quit date: 07/06/2014  . Smokeless tobacco: Never Used  . Alcohol Use: No   OB History    No data available     Review of Systems  Unable to perform ROS: Mental status change      Allergies  Review of patient's allergies indicates no known allergies.  Home Medications   Prior to Admission medications   Medication Sig Start Date End Date Taking? Authorizing Provider  albuterol (PROAIR HFA) 108 (90 BASE) MCG/ACT inhaler 2 puffs every 4 hours as needed only  if your can't catch your breath Patient taking differently: 2 puffs every  4 hours as needed for wheezing and shortness of breath ((PLAN B)) 04/02/14  Yes Nyoka Cowden, MD  albuterol (PROVENTIL) (2.5 MG/3ML) 0.083% nebulizer solution Take 3 mLs (2.5 mg total) by nebulization every 6 (six) hours as needed for wheezing or shortness of breath. Patient taking differently: Take 2.5 mg by nebulization every 4 (four) hours as needed for wheezing or shortness of breath (((PLAN C))).  04/02/14  Yes Nyoka Cowden, MD  aspirin 81 MG tablet Take 81 mg by mouth every morning.    Yes Historical Provider, MD  baclofen (LIORESAL) 10 MG tablet Take 10 mg by mouth at bedtime as needed for muscle spasms.   Yes Historical Provider, MD  budesonide-formoterol (SYMBICORT) 160-4.5 MCG/ACT inhaler Inhale 2 puffs into the lungs 2 (two) times daily.   Yes Historical Provider, MD  citalopram (CELEXA) 20 MG tablet Take 1 tablet by mouth every morning. 08/12/14  Yes Historical Provider, MD  cloNIDine (CATAPRES) 0.1 MG tablet Take 1 tablet (0.1 mg total) by mouth 2 (two) times daily. 07/13/14  Yes Jeanella Craze, NP  diclofenac (VOLTAREN) 75 MG EC tablet Take 75 mg by mouth 2 (two) times daily as needed for mild pain.   Yes Historical Provider, MD  Multiple Vitamin (MULTIVITAMIN WITH MINERALS) TABS Take 1 tablet by mouth every morning.    Yes Historical  Provider, MD  oxyCODONE (OXY IR/ROXICODONE) 5 MG immediate release tablet Take 1 tablet (5 mg total) by mouth every 6 (six) hours as needed for severe pain or breakthrough pain. Patient taking differently: Take 5 mg by mouth 3 (three) times daily as needed for severe pain or breakthrough pain.  06/02/14  Yes Vassie Loll, MD  OxyCODONE (OXYCONTIN) 15 mg T12A 12 hr tablet Take 1 tablet (15 mg total) by mouth every 12 (twelve) hours. 06/02/14  Yes Vassie Loll, MD  OXYGEN Wear 2L at bedtime and with activity as needed for shortness of breath   Yes Historical Provider, MD  polyethylene glycol (MIRALAX / GLYCOLAX) packet Take 17 g by mouth daily as needed for mild  constipation. 07/13/14  Yes Jeanella Craze, NP  POTASSIUM GLUCONATE PO Take 1 tablet by mouth daily with breakfast. Take every morning   Yes Historical Provider, MD  pravastatin (PRAVACHOL) 40 MG tablet Take 40 mg by mouth at bedtime.    Yes Historical Provider, MD  promethazine (PHENERGAN) 25 MG tablet Take 25 mg by mouth every 6 (six) hours as needed for nausea or vomiting.   Yes Historical Provider, MD  ranitidine (ZANTAC) 150 MG tablet Take 150 mg by mouth at bedtime.   Yes Historical Provider, MD  rOPINIRole (REQUIP) 0.5 MG tablet Take 0.5 mg by mouth at bedtime.   Yes Historical Provider, MD  sodium chloride (OCEAN) 0.65 % SOLN nasal spray Place 1 spray into both nostrils as needed for congestion. Patient taking differently: Place 1 spray into both nostrils as needed (nasal stuffiness).  07/13/14  Yes Jeanella Craze, NP   BP 173/67 mmHg  Pulse 121  Temp(Src) 98.3 F (36.8 C) (Oral)  Resp 27  SpO2 100% Physical Exam  Constitutional: She is oriented to person, place, and time. She appears well-developed. No distress.  Appears older than stated age  HENT:  Head: Normocephalic and atraumatic.  Right Ear: External ear normal.  Left Ear: External ear normal.  Eyes: Conjunctivae and EOM are normal. Pupils are equal, round, and reactive to light.  Neck: Normal range of motion and phonation normal. Neck supple.  Cardiovascular: Normal rate, regular rhythm and normal heart sounds.   Pulmonary/Chest: Effort normal and breath sounds normal. She exhibits no bony tenderness.  Abdominal: Soft. There is no tenderness.  Musculoskeletal: Normal range of motion.  Neurological: She is alert and oriented to person, place, and time. No cranial nerve deficit or sensory deficit. She exhibits normal muscle tone. Coordination normal.  Skin: Skin is warm, dry and intact.  Psychiatric: She has a normal mood and affect. Her behavior is normal. Judgment and thought content normal.  Nursing note and vitals  reviewed.   ED Course  Procedures (including critical care time)  Medications  sodium chloride 0.9 % bolus 1,000 mL (0 mLs Intravenous Stopped 04/01/15 1325)    Followed by  sodium chloride 0.9 % bolus 500 mL (500 mLs Intravenous New Bag/Given 04/01/15 1349)  oxyCODONE-acetaminophen (PERCOCET/ROXICET) 5-325 MG per tablet 2 tablet (2 tablets Oral Given 04/01/15 1548)    Patient Vitals for the past 24 hrs:  BP Temp Temp src Pulse Resp SpO2  04/01/15 1546 173/67 mmHg 98.3 F (36.8 C) Oral (!) 121 (!) 27 100 %  04/01/15 1330 159/78 mmHg - - (!) 128 20 92 %  04/01/15 1230 174/74 mmHg - - 116 (!) 29 95 %  04/01/15 1200 183/75 mmHg - - 116 25 95 %  04/01/15 1130 160/80 mmHg - - Marland Kitchen)  122 23 95 %  04/01/15 1100 181/78 mmHg - - 119 (!) 35 95 %  04/01/15 1042 163/91 mmHg 102.4 F (39.1 C) Rectal (!) 123 22 97 %  04/01/15 1030 163/91 mmHg - - 120 (!) 28 95 %  04/01/15 0929 167/86 mmHg 99.1 F (37.3 C) Oral (!) 131 18 92 %    3:38 PM Reevaluation with update and discussion. After initial assessment and treatment, an updated evaluation reveals she is alert, states she feels better, heart rate is 115. Patient and husband updated on findings. Patient requests pain medicine for headache. Husband states that she is more alert than she was earlier. The patient does use home oxygen, so her hypoxia earlier, is not a new process. Camie Hauss L   16:45- discussed with orthopedics. Patient was treated last week and received IM Depo-Medrol Medrol, and IM Toradol. She was not discharged on any additional medications.  4:52 PM Reevaluation with update and discussion. After initial assessment and treatment, an updated evaluation reveals patient states she feels much better. Vital signs have normalized with normal blood pressure and pulse 102. Findings discussed with patient and husband, all questions were answered.Mancel Bale L   Labs Review Labs Reviewed  COMPREHENSIVE METABOLIC PANEL - Abnormal; Notable  for the following:    Sodium 131 (*)    Potassium 3.4 (*)    Chloride 97 (*)    Calcium 8.8 (*)    Total Protein 6.3 (*)    All other components within normal limits  CBC - Abnormal; Notable for the following:    WBC 29.5 (*)    All other components within normal limits  URINALYSIS, ROUTINE W REFLEX MICROSCOPIC (NOT AT [facility]) - Abnormal; Notable for the following:    Color, Urine AMBER (*)    Hgb urine dipstick TRACE (*)    Protein, ur >300 (*)    Leukocytes, UA TRACE (*)    All other components within normal limits  CBG MONITORING, ED - Abnormal; Notable for the following:    Glucose-Capillary 101 (*)    All other components within normal limits  CULTURE, BLOOD (ROUTINE X 2)  CULTURE, BLOOD (ROUTINE X 2)  GASTROINTESTINAL PANEL BY PCR, STOOL (REPLACES STOOL CULTURE)  LIPASE, BLOOD  URINE MICROSCOPIC-ADD ON  I-STAT CG4 LACTIC ACID, ED  I-STAT CG4 LACTIC ACID, ED  I-STAT CG4 LACTIC ACID, ED    Imaging Review Dg Chest Port 1 View  04/01/2015  CLINICAL DATA:  New increased weakness.  Fever and diarrhea. EXAM: PORTABLE CHEST 1 VIEW COMPARISON:  07/09/2014 FINDINGS: Coarse lung markings suggest chronic changes. Surgical screws and rods in the lower thoracic spine. Lungs are clear without airspace disease. Probable skin fold overlying the right upper lung. Heart size is stable without enlargement. Atherosclerotic calcifications at the aortic arch. IMPRESSION: Chronic lung changes without acute findings. Electronically Signed   By: Richarda Overlie M.D.   On: 04/01/2015 11:27   I have personally reviewed and evaluated these images and lab results as part of my medical decision-making.   EKG Interpretation None      MDM   Final diagnoses:  Diarrhea, unspecified type    Nonspecific diarrhea, onset after eating pork product yesterday. No evidence for tear. Spectral infection, metabolic instability or impending vascular collapse. Patient improved after treatment. White blood cell count is  high, she received IM Depo-Medrol Medrol, 80 mg last week. This likely accounts for the leukocytosis. Doubt sepsis. Stool panel testing is pending at the time of discharge. She's not  been on antibiotics recently.  Nursing Notes Reviewed/ Care Coordinated Applicable Imaging Reviewed Interpretation of Laboratory Data incorporated into ED treatment  The patient appears reasonably screened and/or stabilized for discharge and I doubt any other medical condition or other [facility] requiring further screening, evaluation, or treatment in the ED at this time prior to discharge.  Plan: Home Medications- usual, plus Imodium if needed; Home Treatments- rest, push fluids; return here if the recommended treatment, does not improve the symptoms; Recommended follow up- PCP checkup one week.     Mancel Bale, MD 04/01/15 [phone]

## 2015-04-02 ENCOUNTER — Telehealth (HOSPITAL_COMMUNITY): Payer: Self-pay

## 2015-04-02 LAB — GASTROINTESTINAL PANEL BY PCR, STOOL (REPLACES STOOL CULTURE)
ASTROVIRUS: NOT DETECTED
Adenovirus F40/41: NOT DETECTED
CAMPYLOBACTER SPECIES: NOT DETECTED
Cryptosporidium: NOT DETECTED
Cyclospora cayetanensis: NOT DETECTED
E. COLI O157: NOT DETECTED
ENTAMOEBA HISTOLYTICA: NOT DETECTED
ENTEROPATHOGENIC E COLI (EPEC): NOT DETECTED
ENTEROTOXIGENIC E COLI (ETEC): NOT DETECTED
Enteroaggregative E coli (EAEC): NOT DETECTED
Giardia lamblia: NOT DETECTED
NOROVIRUS GI/GII: NOT DETECTED
Plesimonas shigelloides: NOT DETECTED
Rotavirus A: NOT DETECTED
SAPOVIRUS (I, II, IV, AND V): NOT DETECTED
SHIGA LIKE TOXIN PRODUCING E COLI (STEC): NOT DETECTED
Salmonella species: NOT DETECTED
Shigella/Enteroinvasive E coli (EIEC): DETECTED — AB
Vibrio cholerae: NOT DETECTED
Vibrio species: NOT DETECTED
Yersinia enterocolitica: NOT DETECTED

## 2015-04-02 NOTE — Telephone Encounter (Signed)
Call rcvd form Lab stool (+) Shigella/Enteroinvasive E Coli.  04/02/2015 @ 15:45 MD Dr Bea Laura. Effie Shy pt saw during ED visit consulted check on pt if well or doing better f/u w/PCP Monday if still sickly return to the ED 04/02/2015 @ 15:55 Spoke w/pt.  Informed of dx.  Pt stated she is feeling better.  Instructed pt to f/u w/PCP Monday if starts to feel worse return to ED.  Pt voiced understanding.  Cx results faxed to Dr Fuller Mandril [phone].

## 2015-04-06 LAB — CULTURE, BLOOD (ROUTINE X 2)
CULTURE: NO GROWTH
Culture: NO GROWTH

## 2015-06-10 ENCOUNTER — Telehealth: Payer: Self-pay | Admitting: Pulmonary Disease

## 2015-06-10 ENCOUNTER — Ambulatory Visit (INDEPENDENT_AMBULATORY_CARE_PROVIDER_SITE_OTHER): Payer: Medicare Other | Admitting: Internal Medicine

## 2015-06-10 ENCOUNTER — Encounter: Payer: Self-pay | Admitting: Internal Medicine

## 2015-06-10 VITALS — BP 160/64 | HR 78 | Ht 64.0 in | Wt 135.6 lb

## 2015-06-10 DIAGNOSIS — Z720 Tobacco use: Secondary | ICD-10-CM | POA: Diagnosis not present

## 2015-06-10 DIAGNOSIS — J9612 Chronic respiratory failure with hypercapnia: Secondary | ICD-10-CM

## 2015-06-10 DIAGNOSIS — J449 Chronic obstructive pulmonary disease, unspecified: Secondary | ICD-10-CM | POA: Diagnosis not present

## 2015-06-10 DIAGNOSIS — F17210 Nicotine dependence, cigarettes, uncomplicated: Secondary | ICD-10-CM | POA: Diagnosis not present

## 2015-06-10 MED ORDER — AZITHROMYCIN 250 MG PO TABS: ORAL_TABLET | ORAL | Status: DC

## 2015-06-10 MED ORDER — VARENICLINE TARTRATE 0.5 MG X 11 & 1 MG X 42 PO MISC: ORAL | Status: DC

## 2015-06-10 MED ORDER — PREDNISONE 10 MG PO TABS: ORAL_TABLET | ORAL | Status: DC

## 2015-06-10 NOTE — Progress Notes (Signed)
Subjective:   Patient ID: [name], female    DOB: November 29, 1950     MRN: [id number]    Brief patient profile:  64 yowf long smoking hx ? Asthma as child? But fine in HS onset of variable sob x 2010 on noct 02  And maint spiriva and symbicort  then much worse since Thx 2/015 referred by friend to   pulmonary clinic 04/02/2014 and did not return for pfts then required intubation 06/2014  > dx as COPD GOLD III criteria 06/10/2015     History of Present Illness  04/02/2014 1st [facility] office visit/ Kwan Shellhammer  / on ACEi  Chief Complaint  Patient presents with  . Pulmonary Consult    Self referral. Pt c/o cough and SOB for the past 2 months. Her cough is prod with large amounts of yellow to green sputum.  She states feels SOB all of the time, but worse with any exertion. Her sats were 78% ra at rest--increased to 92%2lpm continuous o2.    Just finished cipro / typically much better in short run anyway p prednisone but not this most last flare. Sob x across the room.  Onset was abrupt at [place], pattern is variable but overall declining in terms of best days >>For now stay on 02 2lpm=  24/7 Prednisone 10 mg Take 4 for three days 3 for three days 2 for three days 1 for three days and stop  Stop lisinopril and start benicar 20 mg one half daily until return and your breathing should gradually improve  For cough > mucinex dm 1200 mg every 12 hours and supplement with tramadol  1 every 4 hours Plan A =  Automatic = symbicort 160 Take 2 puffs first thing in am and then another 2 puffs about 12 hours later.                                       spriva each am only Plan B = backup Only use your albuterol (proair)as a rescue medication Plan C = crisis = albuterol 2.5 every 4 hours if needed if plan B doesn't work (ok to use up your present neb solution  if you want)    04/16/2014 NP Follow up : Cough/ACE and Med Calendar Pt returns for a 2 week follow up and med review  Seen last ov for  Cough and Dypsnea.  Taken off ACE  Seen back in office for acute office visit on 3/3 -tx for bronchitis -tx w/ augmentin   Cough is much better, decreased congestion  Feels better . Swelling is better.  Does complain of left ear pain.  We reviewed all her medications and appears to be taking correctly.  System was down unable to complete med calendar  rec Continue on Symbicort and Spiriva . -rinse after use.  Mucinex DM Twice daily  As needed  Cough/congestion  Great job on not smoking.  Continue  On Benicar  1/2 daily .  Follow med calendar closely and bring to each visit.  follow up Dr. Sherene Sires  In 4 weeks with PFT and As needed  > did not return     08/14/2014 Follow up : Presumed COPD-PFT pending  Returns for follow up  Last ov with slow to resolve AECOPD . tx w/ pred taper  She is feeling better.    Pt states breathing is doing well and has  no new complaints.  reveiwed all her meds and organized them into a med calendar with pt education  Appears to be correct.  Denies chest pain , orthopnea, edema or fever rec Continue on Symbicort and Spiriva . -rinse after use.  Mucinex DM Twice daily  As needed  Cough/congestion  Work on on not smoking.   Follow med calendar closely and bring to each visit.  follow up Dr. Sherene Sires  In 4 weeks with PFT and As needed > did not return     06/10/2015 ext f/u ov/Colleen Donahoe re:  Copd GOLD III  Symb/still smoking   Chief Complaint  Patient presents with  . Follow-up    Wants to discuss smoking cessation- ? chantix. Her breathing is doing well. She states has had a cold for the past wk- prod with yellow sputum. She has been using proair 3 x daily on average and has not used neb.    more limited by back than by breathing at this point but wants POC [place] / following [facility] plan for copd rx but not using med calendar provided last ov   No obvious day to day or daytime variability or  cp or chest tightness, subjective wheeze or  overt sinus or hb symptoms. No unusual exp hx or h/o childhood pna/ asthma or knowledge of premature birth.  Sleeping ok without nocturnal  or early am exacerbation  of respiratory  c/o's or need for noct saba. Also denies any obvious fluctuation of symptoms with weather or environmental changes or other aggravating or alleviating factors except as outlined above   Current Medications, Allergies, Complete Past Medical History, Past Surgical History, Family History, and Social History were reviewed in [employer] record.  ROS  The following are not active complaints unless bolded sore throat, dysphagia, dental problems, itching, sneezing,  nasal congestion or excess/ purulent secretions, ear ache,   fever, chills, sweats, unintended wt loss, classically pleuritic or exertional cp, hemoptysis,  orthopnea pnd or leg swelling, presyncope, palpitations, abdominal pain, anorexia, nausea, vomiting, diarrhea  or change in bowel or bladder habits, change in stools or urine, dysuria,hematuria,  rash, arthralgias, visual complaints, headache, numbness, weakness or ataxia or problems with walking or coordination,  change in mood/affect or memory.            Objective:   Physical Exam    amb wf  Wt Readings from Last 3 Encounters:  06/10/15 135 lb 9.6 oz (61.508 kg)  08/14/14 160 lb (72.576 kg)  07/20/14 160 lb (72.576 kg)    Vital signs reviewed      HEENT: full dentures, turbinates, and orophanx. Nl external ear canals without cough reflex   NECK :  without JVD/Nodes/TM/ nl carotid upstrokes bilaterally   LUNGS: slt barrel chest, insp and exp rhonchi bilaterally    CV:  RRR  no s3 or murmur or increase in P2, no edema   ABD:  soft and nontender with nl excursion in the supine position. No bruits or organomegaly, bowel sounds nl  MS:  warm without deformities, calf tenderness, cyanosis or clubbing  SKIN: warm and dry without lesions    NEURO:  alert, approp, no  deficits        I personally reviewed images and agree with radiology impression as follows:  CXR:  04/01/15 Coarse lung markings suggest chronic changes. Surgical screws and rods in the lower thoracic spine. Lungs are clear without airspace disease. Probable skin fold overlying the  right upper lung. Heart size is stable without enlargement. Atherosclerotic calcifications at the aortic arch.        Assessment & Plan:

## 2015-06-10 NOTE — Telephone Encounter (Signed)
[phone]) about a severe interaction between Celexa and Azithromycin manifested by prolonged QT interval. Will change antibiotic Rx to Cefuroxime 250 mg PO BID for 10 day.

## 2015-06-10 NOTE — Patient Instructions (Addendum)
zpak Prednisone 10 mg take  4 each am x 2 days,   2 each am x 2 days,  1 each am x 2 days and stop   Plan A = Automatic = Symbicort 160 Take 2 puffs first thing in am and then another 2 puffs about 12 hours later and try off spiriva     Plan B = Backup Only use your albuterol as a rescue medication to be used if you can't catch your breath by resting or doing a relaxed purse lip breathing pattern.  - The less you use it, the better it will work when you need it. - Ok to use the inhaler up to 2 puffs  every 4 hours if you must but call for appointment if use goes up over your usual need - Don't leave home without it !!  (think of it like the spare tire for your car)   Plan C = Crisis - only use your albuterol nebulizer if you first try Plan B and it fails to help > ok to use the nebulizer up to every 4 hours but if start needing it regularly call for immediate appointment  Start chantix and stop smoking completely in 7 days and follow up with Dr Doristine CounterBurnett before you run out of chantix   Please see patient coordinator before you leave today  to schedule best fit for portable 02 to see if eligible for portable concentrator    If you are satisfied with your treatment plan,  let your doctor know and he/she can either refill your medications or you can return here when your prescription runs out.     If in any way you are not 100% satisfied,  please tell us.  If 100% better, tell your friends!  Pulmonary follow up is as needed

## 2015-06-11 NOTE — Assessment & Plan Note (Addendum)
-   trial off acei 04/02/2014  -  07/20/14 flutter added  -med calendar 08/14/14 > not using 06/10/2015  - Spirometry 06/10/2015  FEV1 1.04 (45%)  Ratio 67 - 06/10/2015  After extensive coaching HFA effectiveness =    75%    Having mild exac now > rx zpak/ Prednisone 10 mg take  4 each am x 2 days,   2 each am x 2 days,  1 each am x 2 days and stop   She would clearly be more symptomatic and might benefit from adding laba if she were more active but presently limited more by back problems  For now no change in meds needed and needs to focus on smoking cessation (see separate a/p)   I had an extended discussion with the patient reviewing all relevant studies completed to date and  lasting 25 minutes of a 40 minute re-establish office visit    Each maintenance medication was reviewed in detail including most importantly the difference between maintenance and prns and under what circumstances the prns are to be triggered using an action plan format that is not reflected in the computer generated alphabetically organized AVS.    Please see instructions for details which were reviewed in writing and the patient given a copy highlighting the part that I personally wrote and discussed at today's ov.

## 2015-06-11 NOTE — Assessment & Plan Note (Signed)
>   3 min Discussed the risks and costs (both direct and indirect)  of smoking relative to the benefits of quitting and pt willing to commit to quit so rec chantix and quit p 7 days if possible > Follow up per Primary Care planned

## 2015-06-11 NOTE — Assessment & Plan Note (Signed)
02 sats at rest 78% RA 04/02/2014  Top 92 % on 2lpm - HC03  35 on 07/11/14  - 07/20/2014   Walked RA x one lap @ 185 stopped due to  Sob/ no desats - 06/10/2015  Walked RA x 3 laps @ 185 ft each stopped due to  desat to 88% improved on 2lpm  so is qualified for portable 02 > referred for POC eval    rec as of 06/10/2015  use 2lpm at hs and prn daytime @ 2lpm

## 2015-06-22 ENCOUNTER — Telehealth: Payer: Self-pay | Admitting: Internal Medicine

## 2015-06-22 NOTE — Telephone Encounter (Signed)
Spoke with pt and she states that she is flying on the 29th and needs documentation for proof of O2 need. Advised pt to contact airline and see if they have a specific form that needs to be completed. Gave pt fax number if they can fax it or advised pt it can be dropped off for completion and we will contact her once completed. Once she has contacted airline she will call us to make sure we have received form.  Holding in triage until form received and/or pt calls.

## 2015-06-24 NOTE — Telephone Encounter (Signed)
Spoke with pt. States that she does not need us to fill a form out anymore. Her husband spoke with the airline and nothing is needing from us. Advised pt that if anything changes to let us know and we would be happy to help. Nothing further was needed.

## 2015-09-04 ENCOUNTER — Emergency Department (HOSPITAL_COMMUNITY)
Admission: EM | Admit: 2015-09-04 | Discharge: 2015-09-05 | Disposition: A | Discharge status: Home / Self Care | Payer: Medicare Other | Attending: Emergency Medicine | Admitting: Emergency Medicine

## 2015-09-04 ENCOUNTER — Encounter (HOSPITAL_COMMUNITY): Payer: Self-pay | Admitting: Emergency Medicine

## 2015-09-04 ENCOUNTER — Emergency Department (HOSPITAL_COMMUNITY): Payer: Medicare Other

## 2015-09-04 DIAGNOSIS — I10 Essential (primary) hypertension: Secondary | ICD-10-CM | POA: Diagnosis not present

## 2015-09-04 DIAGNOSIS — Y92009 Unspecified place in unspecified non-institutional (private) residence as the place of occurrence of the external cause: Secondary | ICD-10-CM | POA: Insufficient documentation

## 2015-09-04 DIAGNOSIS — S20212A Contusion of left front wall of thorax, initial encounter: Secondary | ICD-10-CM | POA: Diagnosis not present

## 2015-09-04 DIAGNOSIS — W1830XA Fall on same level, unspecified, initial encounter: Secondary | ICD-10-CM | POA: Insufficient documentation

## 2015-09-04 DIAGNOSIS — Y939 Activity, unspecified: Secondary | ICD-10-CM | POA: Diagnosis not present

## 2015-09-04 DIAGNOSIS — Z7982 Long term (current) use of aspirin: Secondary | ICD-10-CM | POA: Diagnosis not present

## 2015-09-04 DIAGNOSIS — W19XXXA Unspecified fall, initial encounter: Secondary | ICD-10-CM

## 2015-09-04 DIAGNOSIS — Z79899 Other long term (current) drug therapy: Secondary | ICD-10-CM | POA: Insufficient documentation

## 2015-09-04 DIAGNOSIS — J441 Chronic obstructive pulmonary disease with (acute) exacerbation: Secondary | ICD-10-CM

## 2015-09-04 DIAGNOSIS — S299XXA Unspecified injury of thorax, initial encounter: Secondary | ICD-10-CM | POA: Diagnosis present

## 2015-09-04 DIAGNOSIS — Y999 Unspecified external cause status: Secondary | ICD-10-CM | POA: Diagnosis not present

## 2015-09-04 DIAGNOSIS — F17210 Nicotine dependence, cigarettes, uncomplicated: Secondary | ICD-10-CM | POA: Diagnosis not present

## 2015-09-04 MED ORDER — IPRATROPIUM-ALBUTEROL 0.5-2.5 (3) MG/3ML IN SOLN
3.0000 mL | Freq: Once | RESPIRATORY_TRACT | Status: AC
  Administered 2015-09-05: 3 mL via RESPIRATORY_TRACT
  Filled 2015-09-04: qty 3

## 2015-09-04 MED ORDER — PREDNISONE 20 MG PO TABS
60.0000 mg | ORAL_TABLET | Freq: Once | ORAL | Status: AC
  Administered 2015-09-04: 60 mg via ORAL
  Filled 2015-09-04: qty 3

## 2015-09-04 MED ORDER — OXYCODONE HCL ER 15 MG PO T12A
15.0000 mg | EXTENDED_RELEASE_TABLET | Freq: Once | ORAL | Status: AC
  Administered 2015-09-05: 15 mg via ORAL
  Filled 2015-09-04: qty 1

## 2015-09-04 MED ORDER — IBUPROFEN 800 MG PO TABS
800.0000 mg | ORAL_TABLET | Freq: Once | ORAL | Status: AC
  Administered 2015-09-04: 800 mg via ORAL
  Filled 2015-09-04: qty 1

## 2015-09-04 MED ORDER — OXYCODONE HCL 5 MG PO TABS
5.0000 mg | ORAL_TABLET | Freq: Once | ORAL | Status: AC
  Administered 2015-09-04: 5 mg via ORAL
  Filled 2015-09-04: qty 1

## 2015-09-04 NOTE — ED Triage Notes (Signed)
Pt. fell at home last Thursday night , denies LOC /ambulatory , reports pain at left lateral ribcage worse with movement /deep inspiration , no respiratory distress/hypertensive at arrival .

## 2015-09-04 NOTE — ED Provider Notes (Signed)
[facility]-EMERGENCY DEPT Provider Note   CSN: [id number] Arrival date & time: 09/04/15  7829   First MD Initiated Contact with Patient 09/04/15 2335     By signing my name below, I, Jasmyn B. Alexander, attest that this documentation has been prepared under the direction and in the presence of Dione Booze, MD.  Electronically Signed: Gillis Ends. Lyn Hollingshead, ED Scribe. 09/04/15. 11:53 PM.   History   Chief Complaint Chief Complaint  Patient presents with  . Fall    HPI HPI Comments: [name] is a 65 y.o. female with PMHx of Asthma, HTN, COPD, and HLD who presents to the Emergency Department complaining of gradual worsening, constant, left lower ribcage pain s/p fall that occurred x 2 days. Pt states her lower extremities became numb causing her to fall onto rug. Denies any head injury or LOC. Pain is exacerbated with movement, cough and deep inhalation. No alleviating factors noted. Per pt's husband, pt takes Oxycodone and Oxycontin regularly for chronic pain. He also reports pt has had unexpected weight loss recently and will be visiting PCP for possible thyroid problems. Pt is a smoker.   The history is provided by the patient and the spouse. No language interpreter was used.    Past Medical History:  Diagnosis Date  . Asthma   . Bronchitis   . COPD (chronic obstructive pulmonary disease) (HCC)   . Emphysema   . Encephalopathy 06/2014  . Hyperlipidemia   . Hypertension   . Shortness of breath dyspnea   . UTI (urinary tract infection)     Patient Active Problem List   Diagnosis Date Noted  . Pressure ulcer 07/07/2014  . Altered mental state   . Change in mental state   . Fever   . Acute encephalopathy 07/06/2014  . Narcotic overdose   . Hyponatremia 05/31/2014  . Lethargic 05/31/2014  . Chronic pain 05/31/2014  . Sepsis (HCC) 05/31/2014  . COPD GOLD III/ still smoking  04/02/2014  . Chronic respiratory failure with hypercapnia (HCC) 04/02/2014  . Cigarette smoker  04/02/2014  . COPD exacerbation (HCC) 02/12/2012  . Essential hypertension 02/12/2012  . Tachycardia 02/12/2012    Past Surgical History:  Procedure Laterality Date  . ABDOMINAL HYSTERECTOMY    . APPENDECTOMY    . BACK SURGERY    . INSERTION / PLACEMENT / REVISION NEUROSTIMULATOR    . removal of neurostimulator  06/2014    OB History    No data available      Home Medications    Prior to Admission medications   Medication Sig Start Date End Date Taking? Authorizing Provider  albuterol (PROAIR HFA) 108 (90 BASE) MCG/ACT inhaler 2 puffs every 4 hours as needed only  if your can't catch your breath Patient taking differently: 2 puffs every 4 hours as needed for wheezing and shortness of breath ((PLAN B)) 04/02/14   Nyoka Cowden, MD  albuterol (PROVENTIL) (2.5 MG/3ML) 0.083% nebulizer solution Take 3 mLs (2.5 mg total) by nebulization every 6 (six) hours as needed for wheezing or shortness of breath. Patient taking differently: Take 2.5 mg by nebulization every 4 (four) hours as needed for wheezing or shortness of breath (((PLAN C))).  04/02/14   Nyoka Cowden, MD  aspirin 81 MG tablet Take 81 mg by mouth every morning.     Historical Provider, MD  azithromycin (ZITHROMAX) 250 MG tablet Take 2 on day one then 1 daily x 4 days 06/10/15   Nyoka Cowden, MD  baclofen (  LIORESAL) 10 MG tablet Take 10 mg by mouth at bedtime as needed for muscle spasms.    Historical Provider, MD  budesonide-formoterol (SYMBICORT) 160-4.5 MCG/ACT inhaler Inhale 2 puffs into the lungs 2 (two) times daily.    Historical Provider, MD  citalopram (CELEXA) 20 MG tablet Take 1 tablet by mouth every morning. 08/12/14   Historical Provider, MD  cloNIDine (CATAPRES) 0.1 MG tablet Take 1 tablet (0.1 mg total) by mouth 2 (two) times daily. 07/13/14   Jeanella Craze, NP  diclofenac (VOLTAREN) 75 MG EC tablet Take 75 mg by mouth 2 (two) times daily as needed for mild pain.    Historical Provider, MD  Multiple Vitamin  (MULTIVITAMIN WITH MINERALS) TABS Take 1 tablet by mouth every morning.     Historical Provider, MD  oxyCODONE (OXY IR/ROXICODONE) 5 MG immediate release tablet Take 1 tablet (5 mg total) by mouth every 6 (six) hours as needed for severe pain or breakthrough pain. Patient taking differently: Take 5 mg by mouth 3 (three) times daily as needed for severe pain or breakthrough pain.  06/02/14   Vassie Loll, MD  OxyCODONE (OXYCONTIN) 15 mg T12A 12 hr tablet Take 1 tablet (15 mg total) by mouth every 12 (twelve) hours. 06/02/14   Vassie Loll, MD  OXYGEN Wear 2L at bedtime and with activity as needed for shortness of breath    Historical Provider, MD  polyethylene glycol (MIRALAX / GLYCOLAX) packet Take 17 g by mouth daily as needed for mild constipation. 07/13/14   Jeanella Craze, NP  POTASSIUM GLUCONATE PO Take 1 tablet by mouth daily with breakfast. Take every morning    Historical Provider, MD  pravastatin (PRAVACHOL) 40 MG tablet Take 40 mg by mouth at bedtime.     Historical Provider, MD  predniSONE (DELTASONE) 10 MG tablet Take  4 each am x 2 days,   2 each am x 2 days,  1 each am x 2 days and stop 06/10/15   Nyoka Cowden, MD  promethazine (PHENERGAN) 25 MG tablet Take 25 mg by mouth every 6 (six) hours as needed for nausea or vomiting.    Historical Provider, MD  ranitidine (ZANTAC) 150 MG tablet Take 150 mg by mouth at bedtime.    Historical Provider, MD  rOPINIRole (REQUIP) 0.5 MG tablet Take 0.5 mg by mouth at bedtime.    Historical Provider, MD  sodium chloride (OCEAN) 0.65 % SOLN nasal spray Place 1 spray into both nostrils as needed for congestion. Patient taking differently: Place 1 spray into both nostrils as needed (nasal stuffiness).  07/13/14   Jeanella Craze, NP  varenicline (CHANTIX PAK) 0.5 MG X 11 & 1 MG X 42 tablet Take one 0.5 mg tablet by mouth once daily for 3 days, then increase to one 0.5 mg tablet twice daily for 4 days, then increase to one 1 mg tablet twice daily. 06/10/15    Nyoka Cowden, MD    Family History Family History  Problem Relation Age of Onset  . Emphysema Sister     smoked  . Heart disease Sister   . Ovarian cancer Sister     Social History Social History  Substance Use Topics  . Smoking status: Current Every Day Smoker    Packs/day: 1.50    Years: 30.00    Types: Cigarettes  . Smokeless tobacco: Never Used  . Alcohol use No     Allergies   Review of patient's allergies indicates no known allergies.  Review of Systems Review of Systems  Constitutional: Positive for unexpected weight change.  Musculoskeletal: Positive for arthralgias.  Neurological: Positive for weakness.  All other systems reviewed and are negative.  Physical Exam Updated Vital Signs BP 161/86 (BP Location: Left Arm)   Pulse 85   Temp 98.3 F (36.8 C) (Oral)   Resp 20   Ht 5\' 7"  (1.702 m)   Wt 136 lb (61.7 kg)   SpO2 95%   BMI 21.30 kg/m   Physical Exam  Constitutional: She is oriented to person, place, and time. She appears well-developed and well-nourished.  Appears uncomfortable  HENT:  Head: Normocephalic and atraumatic.  Eyes: EOM are normal. Pupils are equal, round, and reactive to light.  Neck: Normal range of motion. Neck supple. No JVD present.  Cardiovascular: Normal rate, regular rhythm and normal heart sounds.   No murmur heard. Pulmonary/Chest: Effort normal. No respiratory distress. She has wheezes. She has no rales. She exhibits tenderness.  Marked tenderness of left lateral chest wall. No crepitus Faint expiratory wheezes diffusely  Abdominal: Soft. Bowel sounds are normal. She exhibits no distension and no mass. There is no tenderness.  Musculoskeletal: Normal range of motion. She exhibits no edema.  Lymphadenopathy:    She has no cervical adenopathy.  Neurological: She is alert and oriented to person, place, and time. She displays normal reflexes. No cranial nerve deficit. She exhibits normal muscle tone. Coordination  normal.  Skin: Skin is warm and dry. No rash noted.  Psychiatric: She has a normal mood and affect. Her behavior is normal. Judgment and thought content normal.  Nursing note and vitals reviewed.  ED Treatments / Results  DIAGNOSTIC STUDIES: Oxygen Saturation is 95% on RA, adequate by my interpretation.    COORDINATION OF CARE: 11:42 PM-Discussed treatment plan which includes order of albuterol treatment, Oxycodone, Oxycontin, and Ibuprofen with pt at bedside and pt agreed to plan.   Radiology Dg Ribs Unilateral W/chest Left  Result Date: 09/04/2015 CLINICAL DATA:  Fall 2 days ago with persistent left chest pain, initial encounter EXAM: LEFT RIBS AND CHEST - 3+ VIEW COMPARISON:  04/01/2015 FINDINGS: Cardiac shadow is within normal limits. Postsurgical changes are noted in the thoracolumbar spine stable from the prior study. The lungs are well-aerated without focal infiltrate or sizable effusion. No pneumothorax is seen. No acute rib fracture is noted. IMPRESSION: No acute abnormality seen. Electronically Signed   By: Alcide Clever M.D.   On: 09/04/2015 20:29  Procedures Procedures (including critical care time)  Medications Ordered in ED Medications  ipratropium-albuterol (DUONEB) 0.5-2.5 (3) MG/3ML nebulizer solution 3 mL (not administered)  oxyCODONE (Oxy IR/ROXICODONE) immediate release tablet 5 mg (not administered)  oxyCODONE (OXYCONTIN) 12 hr tablet 15 mg (not administered)  predniSONE (DELTASONE) tablet 60 mg (not administered)  ibuprofen (ADVIL,MOTRIN) tablet 800 mg (not administered)   Initial Impression / Assessment and Plan / ED Course  I have reviewed the triage vital signs and the nursing notes.  Pertinent labs & imaging results that were available during my care of the patient were reviewed by me and considered in my medical decision making (see chart for details).  Clinical Course    Recent fall with chest wall contusion. She has been sent for x-rays which show no  evidence of fracture. She does show evidence of bronchospasm with wheezing and is given albuterol with ipratropium via nebulizer. She has missed scheduled doses of oxycodone and OxyContin and is given doses in the ED. Old records are reviewed and  she has prior ED visits and hospitalizations for COPD.  Following nebulizer treatment, lungs were completely clear. Patient is advised to continue her program of oxycodone and OxyContin and is given a new prescription for diclofenac. Advised to apply ice to the painful area. Follow-up with PCP.  Final Clinical Impressions(s) / ED Diagnoses   Final diagnoses:  Fall at home, initial encounter  Chest wall contusion, left, initial encounter  COPD exacerbation (HCC)    New Prescriptions New Prescriptions   No medications on file   I personally performed the services described in this documentation, which was scribed in my presence. The recorded information has been reviewed and is accurate.       Dione Booze, MD 09/05/15 [phone]

## 2015-09-05 MED ORDER — PREDNISONE 50 MG PO TABS: 50.0000 mg | ORAL_TABLET | Freq: Every day | ORAL | 0 refills | Status: DC

## 2015-09-05 MED ORDER — DICLOFENAC SODIUM 75 MG PO TBEC: 75.0000 mg | DELAYED_RELEASE_TABLET | Freq: Two times a day (BID) | ORAL | 0 refills | Status: AC

## 2015-09-05 NOTE — Discharge Instructions (Signed)
Continue taking your oxycodone and Oxycontin as prescribed.

## 2015-12-05 ENCOUNTER — Inpatient Hospital Stay (HOSPITAL_COMMUNITY): Payer: Medicare Other

## 2015-12-05 ENCOUNTER — Inpatient Hospital Stay (HOSPITAL_COMMUNITY): Payer: Medicare Other | Admitting: Anesthesiology

## 2015-12-05 ENCOUNTER — Encounter (HOSPITAL_COMMUNITY)
Admission: EM | Disposition: A | Discharge status: Skilled Nursing Facility | Payer: Self-pay | Source: Home / Self Care | Attending: Internal Medicine

## 2015-12-05 ENCOUNTER — Encounter (HOSPITAL_COMMUNITY): Payer: Self-pay | Admitting: *Deleted

## 2015-12-05 ENCOUNTER — Emergency Department (HOSPITAL_COMMUNITY): Payer: Medicare Other

## 2015-12-05 ENCOUNTER — Inpatient Hospital Stay (HOSPITAL_COMMUNITY)
Admission: EM | Admit: 2015-12-05 | Discharge: 2015-12-08 | DRG: 470 | Disposition: A | Discharge status: Skilled Nursing Facility | Payer: Medicare Other | Attending: Internal Medicine | Admitting: Internal Medicine

## 2015-12-05 DIAGNOSIS — I10 Essential (primary) hypertension: Secondary | ICD-10-CM | POA: Diagnosis present

## 2015-12-05 DIAGNOSIS — S72009A Fracture of unspecified part of neck of unspecified femur, initial encounter for closed fracture: Secondary | ICD-10-CM

## 2015-12-05 DIAGNOSIS — G894 Chronic pain syndrome: Secondary | ICD-10-CM

## 2015-12-05 DIAGNOSIS — Z79899 Other long term (current) drug therapy: Secondary | ICD-10-CM | POA: Diagnosis not present

## 2015-12-05 DIAGNOSIS — E785 Hyperlipidemia, unspecified: Secondary | ICD-10-CM | POA: Diagnosis present

## 2015-12-05 DIAGNOSIS — Z825 Family history of asthma and other chronic lower respiratory diseases: Secondary | ICD-10-CM

## 2015-12-05 DIAGNOSIS — Z981 Arthrodesis status: Secondary | ICD-10-CM

## 2015-12-05 DIAGNOSIS — S72001A Fracture of unspecified part of neck of right femur, initial encounter for closed fracture: Principal | ICD-10-CM | POA: Diagnosis present

## 2015-12-05 DIAGNOSIS — J9612 Chronic respiratory failure with hypercapnia: Secondary | ICD-10-CM | POA: Diagnosis present

## 2015-12-05 DIAGNOSIS — W010XXA Fall on same level from slipping, tripping and stumbling without subsequent striking against object, initial encounter: Secondary | ICD-10-CM | POA: Diagnosis present

## 2015-12-05 DIAGNOSIS — Z8781 Personal history of (healed) traumatic fracture: Secondary | ICD-10-CM

## 2015-12-05 DIAGNOSIS — F1120 Opioid dependence, uncomplicated: Secondary | ICD-10-CM | POA: Diagnosis present

## 2015-12-05 DIAGNOSIS — W19XXXA Unspecified fall, initial encounter: Secondary | ICD-10-CM

## 2015-12-05 DIAGNOSIS — Z8249 Family history of ischemic heart disease and other diseases of the circulatory system: Secondary | ICD-10-CM

## 2015-12-05 DIAGNOSIS — J439 Emphysema, unspecified: Secondary | ICD-10-CM | POA: Diagnosis present

## 2015-12-05 DIAGNOSIS — H6692 Otitis media, unspecified, left ear: Secondary | ICD-10-CM | POA: Diagnosis present

## 2015-12-05 DIAGNOSIS — D62 Acute posthemorrhagic anemia: Secondary | ICD-10-CM

## 2015-12-05 DIAGNOSIS — J449 Chronic obstructive pulmonary disease, unspecified: Secondary | ICD-10-CM | POA: Diagnosis not present

## 2015-12-05 DIAGNOSIS — M25551 Pain in right hip: Secondary | ICD-10-CM | POA: Diagnosis present

## 2015-12-05 DIAGNOSIS — E871 Hypo-osmolality and hyponatremia: Secondary | ICD-10-CM | POA: Diagnosis present

## 2015-12-05 DIAGNOSIS — F17210 Nicotine dependence, cigarettes, uncomplicated: Secondary | ICD-10-CM | POA: Diagnosis present

## 2015-12-05 DIAGNOSIS — Z7982 Long term (current) use of aspirin: Secondary | ICD-10-CM

## 2015-12-05 DIAGNOSIS — R262 Difficulty in walking, not elsewhere classified: Secondary | ICD-10-CM

## 2015-12-05 DIAGNOSIS — Z9981 Dependence on supplemental oxygen: Secondary | ICD-10-CM | POA: Diagnosis not present

## 2015-12-05 DIAGNOSIS — R627 Adult failure to thrive: Secondary | ICD-10-CM | POA: Diagnosis present

## 2015-12-05 DIAGNOSIS — Z9889 Other specified postprocedural states: Secondary | ICD-10-CM

## 2015-12-05 DIAGNOSIS — J9611 Chronic respiratory failure with hypoxia: Secondary | ICD-10-CM | POA: Diagnosis present

## 2015-12-05 DIAGNOSIS — G8929 Other chronic pain: Secondary | ICD-10-CM | POA: Diagnosis present

## 2015-12-05 DIAGNOSIS — Y92000 Kitchen of unspecified non-institutional (private) residence as  the place of occurrence of the external cause: Secondary | ICD-10-CM

## 2015-12-05 HISTORY — DX: Fracture of unspecified part of neck of right femur, initial encounter for closed fracture: S72.001A

## 2015-12-05 HISTORY — PX: HIP ARTHROPLASTY: SHX981

## 2015-12-05 LAB — BASIC METABOLIC PANEL
Anion gap: 7 (ref 5–15)
BUN: 10 mg/dL (ref 6–20)
CO2: 27 mmol/L (ref 22–32)
CREATININE: 0.5 mg/dL (ref 0.44–1.00)
Calcium: 9.1 mg/dL (ref 8.9–10.3)
Chloride: 93 mmol/L — ABNORMAL LOW (ref 101–111)
GFR calc Af Amer: >60 mL/min (ref >60)
Glucose, Bld: 87 mg/dL (ref 65–99)
Potassium: 4.5 mmol/L (ref 3.5–5.1)
SODIUM: 127 mmol/L — AB (ref 135–145)

## 2015-12-05 LAB — CBC WITH DIFFERENTIAL/PLATELET
BASOS ABS: 0 10*3/uL (ref 0.0–0.1)
Basophils Relative: 0 %
EOS ABS: 0.3 10*3/uL (ref 0.0–0.7)
EOS PCT: 2 %
HCT: 38.1 % (ref 36.0–46.0)
Hemoglobin: 13 g/dL (ref 12.0–15.0)
LYMPHS ABS: 1.8 10*3/uL (ref 0.7–4.0)
Lymphocytes Relative: 14 %
MCH: 30 pg (ref 26.0–34.0)
MCHC: 34.1 g/dL (ref 30.0–36.0)
MCV: 88 fL (ref 78.0–100.0)
Monocytes Absolute: 0.9 10*3/uL (ref 0.1–1.0)
Monocytes Relative: 7 %
Neutro Abs: 9.9 10*3/uL — ABNORMAL HIGH (ref 1.7–7.7)
Neutrophils Relative %: 77 %
PLATELETS: 222 10*3/uL (ref 150–400)
RBC: 4.33 MIL/uL (ref 3.87–5.11)
RDW: 12.8 % (ref 11.5–15.5)
WBC: 12.8 10*3/uL — AB (ref 4.0–10.5)

## 2015-12-05 LAB — TYPE AND SCREEN
ABO/RH(D): O POS
ANTIBODY SCREEN: NEGATIVE

## 2015-12-05 LAB — PROTIME-INR
INR: 0.99
PROTHROMBIN TIME: 13.1 s (ref 11.4–15.2)

## 2015-12-05 LAB — SURGICAL PCR SCREEN
MRSA, PCR: NEGATIVE
Staphylococcus aureus: POSITIVE — AB

## 2015-12-05 LAB — OSMOLALITY: OSMOLALITY: 266 mosm/kg — AB (ref 275–295)

## 2015-12-05 SURGERY — HEMIARTHROPLASTY, HIP, DIRECT ANTERIOR APPROACH, FOR FRACTURE
Anesthesia: General | Site: Hip | Laterality: Right

## 2015-12-05 MED ORDER — OXYCODONE HCL 5 MG PO TABS: 5.0000 mg | ORAL_TABLET | Freq: Four times a day (QID) | ORAL | Status: DC | PRN

## 2015-12-05 MED ORDER — BUPIVACAINE-EPINEPHRINE (PF) 0.5% -1:200000 IJ SOLN
INTRAMUSCULAR | Status: DC | PRN
  Administered 2015-12-05: 20 mL via PERINEURAL

## 2015-12-05 MED ORDER — CALCIUM CARBONATE-VITAMIN D 500-200 MG-UNIT PO TABS
1.0000 | ORAL_TABLET | Freq: Every day | ORAL | Status: DC
  Administered 2015-12-06 – 2015-12-07 (×2): 1 via ORAL
  Filled 2015-12-05 (×6): qty 1

## 2015-12-05 MED ORDER — CLONIDINE HCL 0.1 MG PO TABS
0.1000 mg | ORAL_TABLET | Freq: Two times a day (BID) | ORAL | Status: DC
  Administered 2015-12-05 – 2015-12-08 (×6): 0.1 mg via ORAL
  Filled 2015-12-05 (×8): qty 1

## 2015-12-05 MED ORDER — ONDANSETRON HCL 4 MG/2ML IJ SOLN: 4.0000 mg | Freq: Once | INTRAMUSCULAR | Status: DC | PRN

## 2015-12-05 MED ORDER — MORPHINE SULFATE (PF) 4 MG/ML IV SOLN: 0.5000 mg | INTRAVENOUS | Status: DC | PRN

## 2015-12-05 MED ORDER — CEFAZOLIN SODIUM-DEXTROSE 2-4 GM/100ML-% IV SOLN
2.0000 g | INTRAVENOUS | Status: DC
  Filled 2015-12-05: qty 100

## 2015-12-05 MED ORDER — ONDANSETRON HCL 4 MG/2ML IJ SOLN
4.0000 mg | Freq: Once | INTRAMUSCULAR | Status: AC
  Administered 2015-12-05: 4 mg via INTRAVENOUS
  Filled 2015-12-05: qty 2

## 2015-12-05 MED ORDER — POVIDONE-IODINE 10 % EX SWAB: 2.0000 "application " | Freq: Once | CUTANEOUS | Status: DC

## 2015-12-05 MED ORDER — CEFAZOLIN SODIUM-DEXTROSE 2-4 GM/100ML-% IV SOLN
2.0000 g | Freq: Four times a day (QID) | INTRAVENOUS | Status: AC
Start: 2015-12-05 — End: 2015-12-06
  Administered 2015-12-06 (×2): 2 g via INTRAVENOUS
  Filled 2015-12-05 (×2): qty 100

## 2015-12-05 MED ORDER — OXYCODONE HCL 5 MG PO TABS: 5.0000 mg | ORAL_TABLET | Freq: Once | ORAL | Status: DC | PRN

## 2015-12-05 MED ORDER — OMEGA-3-ACID ETHYL ESTERS 1 G PO CAPS
1.0000 g | ORAL_CAPSULE | Freq: Every day | ORAL | Status: DC
  Administered 2015-12-06 – 2015-12-08 (×3): 1 g via ORAL
  Filled 2015-12-05 (×3): qty 1

## 2015-12-05 MED ORDER — SODIUM CHLORIDE 0.9 % IV SOLN
INTRAVENOUS | Status: DC
  Administered 2015-12-05: 13:00:00 via INTRAVENOUS

## 2015-12-05 MED ORDER — CITALOPRAM HYDROBROMIDE 20 MG PO TABS
20.0000 mg | ORAL_TABLET | ORAL | Status: DC
  Administered 2015-12-06 – 2015-12-08 (×3): 20 mg via ORAL
  Filled 2015-12-05 (×3): qty 1

## 2015-12-05 MED ORDER — DOCUSATE SODIUM 100 MG PO CAPS
100.0000 mg | ORAL_CAPSULE | Freq: Two times a day (BID) | ORAL | Status: DC
  Administered 2015-12-06 – 2015-12-08 (×5): 100 mg via ORAL
  Filled 2015-12-05 (×6): qty 1

## 2015-12-05 MED ORDER — SUGAMMADEX SODIUM 200 MG/2ML IV SOLN
INTRAVENOUS | Status: DC | PRN
  Administered 2015-12-05: 200 mg via INTRAVENOUS

## 2015-12-05 MED ORDER — SALINE SPRAY 0.65 % NA SOLN
1.0000 | NASAL | Status: DC | PRN
  Filled 2015-12-05: qty 44

## 2015-12-05 MED ORDER — FERROUS SULFATE 325 (65 FE) MG PO TABS
325.0000 mg | ORAL_TABLET | Freq: Three times a day (TID) | ORAL | Status: DC
  Administered 2015-12-06 – 2015-12-08 (×9): 325 mg via ORAL
  Filled 2015-12-05 (×9): qty 1

## 2015-12-05 MED ORDER — MAGNESIUM CITRATE PO SOLN
1.0000 | Freq: Once | ORAL | Status: AC | PRN
  Administered 2015-12-08: 1 via ORAL
  Filled 2015-12-05: qty 296

## 2015-12-05 MED ORDER — NICOTINE 21 MG/24HR TD PT24
21.0000 mg | MEDICATED_PATCH | Freq: Every day | TRANSDERMAL | Status: DC
  Administered 2015-12-06 – 2015-12-08 (×3): 21 mg via TRANSDERMAL
  Filled 2015-12-05 (×3): qty 1

## 2015-12-05 MED ORDER — HYDROMORPHONE HCL 2 MG/ML IJ SOLN
1.0000 mg | INTRAMUSCULAR | Status: AC | PRN
  Administered 2015-12-05 (×2): 1 mg via INTRAVENOUS
  Filled 2015-12-05 (×2): qty 1

## 2015-12-05 MED ORDER — LIDOCAINE HCL (CARDIAC) 20 MG/ML IV SOLN
INTRAVENOUS | Status: DC | PRN
  Administered 2015-12-05: 100 mg via INTRATRACHEAL

## 2015-12-05 MED ORDER — FENTANYL CITRATE (PF) 100 MCG/2ML IJ SOLN
INTRAMUSCULAR | Status: AC
Start: 2015-12-05 — End: 2015-12-05
  Filled 2015-12-05: qty 2

## 2015-12-05 MED ORDER — IPRATROPIUM-ALBUTEROL 0.5-2.5 (3) MG/3ML IN SOLN: 3.0000 mL | Freq: Once | RESPIRATORY_TRACT | Status: DC

## 2015-12-05 MED ORDER — FAMOTIDINE 20 MG PO TABS
10.0000 mg | ORAL_TABLET | Freq: Every day | ORAL | Status: DC
  Administered 2015-12-06 – 2015-12-07 (×2): 10 mg via ORAL
  Filled 2015-12-05 (×3): qty 1

## 2015-12-05 MED ORDER — ALBUTEROL SULFATE (2.5 MG/3ML) 0.083% IN NEBU: 2.5000 mg | INHALATION_SOLUTION | RESPIRATORY_TRACT | Status: DC | PRN

## 2015-12-05 MED ORDER — HYDROMORPHONE HCL 1 MG/ML IJ SOLN
INTRAMUSCULAR | Status: AC
  Filled 2015-12-05: qty 0.5

## 2015-12-05 MED ORDER — MORPHINE SULFATE (PF) 2 MG/ML IV SOLN
2.0000 mg | INTRAVENOUS | Status: DC | PRN
  Administered 2015-12-06 (×3): 2 mg via INTRAVENOUS
  Filled 2015-12-05 (×3): qty 1

## 2015-12-05 MED ORDER — FENTANYL CITRATE (PF) 100 MCG/2ML IJ SOLN
100.0000 ug | INTRAMUSCULAR | Status: AC | PRN
  Administered 2015-12-05 (×3): 100 ug via INTRAVENOUS
  Filled 2015-12-05 (×3): qty 2

## 2015-12-05 MED ORDER — ZINC SULFATE 220 (50 ZN) MG PO CAPS
220.0000 mg | ORAL_CAPSULE | Freq: Every day | ORAL | Status: DC
  Administered 2015-12-06 – 2015-12-07 (×2): 220 mg via ORAL
  Filled 2015-12-05 (×3): qty 1

## 2015-12-05 MED ORDER — PROPOFOL 10 MG/ML IV BOLUS
INTRAVENOUS | Status: DC | PRN
  Administered 2015-12-05: 200 mg via INTRAVENOUS

## 2015-12-05 MED ORDER — ONDANSETRON HCL 4 MG/2ML IJ SOLN
INTRAMUSCULAR | Status: DC | PRN
  Administered 2015-12-05: 4 mg via INTRAVENOUS

## 2015-12-05 MED ORDER — PHENOL 1.4 % MT LIQD: 1.0000 | OROMUCOSAL | Status: DC | PRN

## 2015-12-05 MED ORDER — ENOXAPARIN SODIUM 40 MG/0.4ML ~~LOC~~ SOLN
40.0000 mg | SUBCUTANEOUS | Status: DC
  Administered 2015-12-06 – 2015-12-08 (×3): 40 mg via SUBCUTANEOUS
  Filled 2015-12-05 (×3): qty 0.4

## 2015-12-05 MED ORDER — MAGNESIUM-ZINC 133.33-5 MG PO TABS: ORAL_TABLET | Freq: Every day | ORAL | Status: DC

## 2015-12-05 MED ORDER — OXYCODONE HCL 5 MG PO TABS
5.0000 mg | ORAL_TABLET | Freq: Three times a day (TID) | ORAL | Status: DC | PRN
  Administered 2015-12-06 – 2015-12-08 (×5): 5 mg via ORAL
  Filled 2015-12-05 (×6): qty 1

## 2015-12-05 MED ORDER — ENOXAPARIN SODIUM 40 MG/0.4ML ~~LOC~~ SOLN: 40.0000 mg | SUBCUTANEOUS | 0 refills | Status: DC

## 2015-12-05 MED ORDER — GABAPENTIN 300 MG PO CAPS
300.0000 mg | ORAL_CAPSULE | Freq: Every morning | ORAL | Status: DC
  Administered 2015-12-06 – 2015-12-08 (×3): 300 mg via ORAL
  Filled 2015-12-05 (×3): qty 1

## 2015-12-05 MED ORDER — BISACODYL 10 MG RE SUPP: 10.0000 mg | Freq: Every day | RECTAL | Status: DC | PRN

## 2015-12-05 MED ORDER — PREDNISONE 20 MG PO TABS: 50.0000 mg | ORAL_TABLET | Freq: Every day | ORAL | Status: DC

## 2015-12-05 MED ORDER — MIDAZOLAM HCL 2 MG/2ML IJ SOLN
INTRAMUSCULAR | Status: AC
  Filled 2015-12-05: qty 2

## 2015-12-05 MED ORDER — HYDROMORPHONE HCL 1 MG/ML IJ SOLN
0.2500 mg | INTRAMUSCULAR | Status: DC | PRN
  Administered 2015-12-05 (×2): 0.5 mg via INTRAVENOUS

## 2015-12-05 MED ORDER — BACLOFEN 10 MG PO TABS: 10.0000 mg | ORAL_TABLET | Freq: Every evening | ORAL | Status: DC | PRN

## 2015-12-05 MED ORDER — ONDANSETRON HCL 4 MG/2ML IJ SOLN: 4.0000 mg | Freq: Four times a day (QID) | INTRAMUSCULAR | Status: DC | PRN

## 2015-12-05 MED ORDER — ACETAMINOPHEN 325 MG PO TABS: 650.0000 mg | ORAL_TABLET | Freq: Four times a day (QID) | ORAL | Status: DC | PRN

## 2015-12-05 MED ORDER — MAGNESIUM OXIDE 400 (241.3 MG) MG PO TABS
200.0000 mg | ORAL_TABLET | Freq: Every day | ORAL | Status: DC
  Administered 2015-12-06 – 2015-12-07 (×2): 200 mg via ORAL
  Filled 2015-12-05 (×3): qty 1

## 2015-12-05 MED ORDER — AMLODIPINE BESYLATE 10 MG PO TABS
10.0000 mg | ORAL_TABLET | Freq: Every day | ORAL | Status: DC
  Administered 2015-12-06 – 2015-12-07 (×2): 10 mg via ORAL
  Filled 2015-12-05 (×3): qty 1

## 2015-12-05 MED ORDER — POLYETHYLENE GLYCOL 3350 17 G PO PACK: 17.0000 g | PACK | Freq: Every day | ORAL | Status: DC | PRN

## 2015-12-05 MED ORDER — LABETALOL HCL 5 MG/ML IV SOLN: 10.0000 mg | INTRAVENOUS | Status: DC | PRN

## 2015-12-05 MED ORDER — CHLORHEXIDINE GLUCONATE CLOTH 2 % EX PADS: 6.0000 | MEDICATED_PAD | Freq: Every day | CUTANEOUS | Status: DC

## 2015-12-05 MED ORDER — CHLORHEXIDINE GLUCONATE 4 % EX LIQD
60.0000 mL | Freq: Once | CUTANEOUS | Status: AC
  Administered 2015-12-05: 4 via TOPICAL
  Filled 2015-12-05: qty 60

## 2015-12-05 MED ORDER — SENNA 8.6 MG PO TABS
1.0000 | ORAL_TABLET | Freq: Two times a day (BID) | ORAL | Status: DC
  Administered 2015-12-06 – 2015-12-08 (×5): 8.6 mg via ORAL
  Filled 2015-12-05 (×6): qty 1

## 2015-12-05 MED ORDER — MUPIROCIN 2 % EX OINT
1.0000 "application " | TOPICAL_OINTMENT | Freq: Two times a day (BID) | CUTANEOUS | Status: DC
  Administered 2015-12-06 – 2015-12-08 (×5): 1 via NASAL
  Filled 2015-12-05: qty 22

## 2015-12-05 MED ORDER — BUPIVACAINE-EPINEPHRINE (PF) 0.5% -1:200000 IJ SOLN
INTRAMUSCULAR | Status: AC
  Filled 2015-12-05: qty 30

## 2015-12-05 MED ORDER — LABETALOL HCL 5 MG/ML IV SOLN
10.0000 mg | INTRAVENOUS | Status: AC | PRN
  Administered 2015-12-05: 10 mg via INTRAVENOUS

## 2015-12-05 MED ORDER — SODIUM CHLORIDE 0.9 % IV SOLN
INTRAVENOUS | Status: DC | PRN
  Administered 2015-12-05 (×2): via INTRAVENOUS

## 2015-12-05 MED ORDER — HYDROCODONE-ACETAMINOPHEN 5-325 MG PO TABS
1.0000 | ORAL_TABLET | Freq: Four times a day (QID) | ORAL | Status: DC | PRN
  Administered 2015-12-06 – 2015-12-08 (×5): 1 via ORAL
  Administered 2015-12-08: 2 via ORAL
  Filled 2015-12-05: qty 1
  Filled 2015-12-05: qty 2
  Filled 2015-12-05 (×3): qty 1
  Filled 2015-12-05: qty 2
  Filled 2015-12-05: qty 1

## 2015-12-05 MED ORDER — ALUM & MAG HYDROXIDE-SIMETH 200-200-20 MG/5ML PO SUSP: 30.0000 mL | ORAL | Status: DC | PRN

## 2015-12-05 MED ORDER — ONDANSETRON HCL 4 MG PO TABS: 4.0000 mg | ORAL_TABLET | Freq: Four times a day (QID) | ORAL | Status: DC | PRN

## 2015-12-05 MED ORDER — GABAPENTIN 300 MG PO CAPS
600.0000 mg | ORAL_CAPSULE | Freq: Every day | ORAL | Status: DC
  Administered 2015-12-06 – 2015-12-07 (×2): 600 mg via ORAL
  Filled 2015-12-05 (×3): qty 2

## 2015-12-05 MED ORDER — SUCCINYLCHOLINE CHLORIDE 20 MG/ML IJ SOLN
INTRAMUSCULAR | Status: DC | PRN
  Administered 2015-12-05: 70 mg via INTRAVENOUS

## 2015-12-05 MED ORDER — CEFAZOLIN SODIUM-DEXTROSE 2-3 GM-% IV SOLR
INTRAVENOUS | Status: DC | PRN
  Administered 2015-12-05: 2 g via INTRAVENOUS

## 2015-12-05 MED ORDER — MOMETASONE FURO-FORMOTEROL FUM 200-5 MCG/ACT IN AERO
2.0000 | INHALATION_SPRAY | Freq: Two times a day (BID) | RESPIRATORY_TRACT | Status: DC
  Administered 2015-12-06 – 2015-12-08 (×5): 2 via RESPIRATORY_TRACT
  Filled 2015-12-05: qty 8.8

## 2015-12-05 MED ORDER — PROPOFOL 10 MG/ML IV BOLUS
INTRAVENOUS | Status: AC
  Filled 2015-12-05: qty 40

## 2015-12-05 MED ORDER — OXYCODONE HCL 5 MG/5ML PO SOLN: 5.0000 mg | Freq: Once | ORAL | Status: DC | PRN

## 2015-12-05 MED ORDER — CHLORHEXIDINE GLUCONATE 4 % EX LIQD: 60.0000 mL | Freq: Once | CUTANEOUS | Status: DC

## 2015-12-05 MED ORDER — SODIUM CHLORIDE 0.9 % IV SOLN
75.0000 mL/h | INTRAVENOUS | Status: DC
  Administered 2015-12-05 – 2015-12-06 (×2): 75 mL/h via INTRAVENOUS

## 2015-12-05 MED ORDER — SENNA-DOCUSATE SODIUM 8.6-50 MG PO TABS: 2.0000 | ORAL_TABLET | Freq: Every day | ORAL | 1 refills | Status: DC

## 2015-12-05 MED ORDER — ACETAMINOPHEN 650 MG RE SUPP: 650.0000 mg | Freq: Four times a day (QID) | RECTAL | Status: DC | PRN

## 2015-12-05 MED ORDER — DOCUSATE SODIUM 100 MG PO CAPS: 100.0000 mg | ORAL_CAPSULE | Freq: Two times a day (BID) | ORAL | Status: DC

## 2015-12-05 MED ORDER — PRAVASTATIN SODIUM 40 MG PO TABS
40.0000 mg | ORAL_TABLET | Freq: Every day | ORAL | Status: DC
  Administered 2015-12-06 – 2015-12-07 (×2): 40 mg via ORAL
  Filled 2015-12-05 (×3): qty 1

## 2015-12-05 MED ORDER — LABETALOL HCL 5 MG/ML IV SOLN
INTRAVENOUS | Status: AC
  Filled 2015-12-05: qty 4

## 2015-12-05 MED ORDER — OXYCODONE HCL ER 10 MG PO T12A
10.0000 mg | EXTENDED_RELEASE_TABLET | Freq: Two times a day (BID) | ORAL | Status: DC
  Administered 2015-12-06 – 2015-12-08 (×5): 10 mg via ORAL
  Filled 2015-12-05 (×5): qty 1

## 2015-12-05 MED ORDER — SODIUM CHLORIDE 0.9 % IR SOLN
Status: DC | PRN
  Administered 2015-12-05: 1000 mL

## 2015-12-05 MED ORDER — MENTHOL 3 MG MT LOZG: 1.0000 | LOZENGE | OROMUCOSAL | Status: DC | PRN

## 2015-12-05 MED ORDER — OXYCODONE HCL 5 MG PO TABS: 5.0000 mg | ORAL_TABLET | Freq: Three times a day (TID) | ORAL | Status: DC | PRN

## 2015-12-05 MED ORDER — OXYCODONE HCL ER 15 MG PO T12A: 15.0000 mg | EXTENDED_RELEASE_TABLET | Freq: Two times a day (BID) | ORAL | Status: DC

## 2015-12-05 MED ORDER — GABAPENTIN 300 MG PO CAPS: 300.0000 mg | ORAL_CAPSULE | ORAL | Status: DC

## 2015-12-05 MED ORDER — FENTANYL CITRATE (PF) 250 MCG/5ML IJ SOLN
INTRAMUSCULAR | Status: DC | PRN
  Administered 2015-12-05 (×2): 50 ug via INTRAVENOUS
  Administered 2015-12-05: 100 ug via INTRAVENOUS

## 2015-12-05 MED ORDER — FENTANYL CITRATE (PF) 100 MCG/2ML IJ SOLN
INTRAMUSCULAR | Status: AC
  Filled 2015-12-05: qty 2

## 2015-12-05 MED ORDER — ALBUTEROL SULFATE HFA 108 (90 BASE) MCG/ACT IN AERS: 2.0000 | INHALATION_SPRAY | RESPIRATORY_TRACT | Status: DC | PRN

## 2015-12-05 SURGICAL SUPPLY — 60 items
BENZOIN TINCTURE PRP APPL 2/3 (GAUZE/BANDAGES/DRESSINGS) ×2 IMPLANT
BLADE SAW SGTL 18.5X63.X.64 HD (BLADE) ×2 IMPLANT
BRUSH FEMORAL CANAL (MISCELLANEOUS) IMPLANT
CAPT HIP HEMI 1 ×2 IMPLANT
CLSR STERI-STRIP ANTIMIC 1/2X4 (GAUZE/BANDAGES/DRESSINGS) ×2 IMPLANT
COVER BACK TABLE 24X17X13 BIG (DRAPES) IMPLANT
COVER SURGICAL LIGHT HANDLE (MISCELLANEOUS) ×2 IMPLANT
DRAPE IMP U-DRAPE 54X76 (DRAPES) ×2 IMPLANT
DRAPE INCISE IOBAN 66X45 STRL (DRAPES) IMPLANT
DRAPE ORTHO SPLIT 77X108 STRL (DRAPES) ×2
DRAPE SURG ORHT 6 SPLT 77X108 (DRAPES) ×2 IMPLANT
DRAPE U-SHAPE 47X51 STRL (DRAPES) ×2 IMPLANT
DRILL BIT 5/64 (BIT) ×2 IMPLANT
DRSG MEPILEX BORDER 4X12 (GAUZE/BANDAGES/DRESSINGS) IMPLANT
DRSG MEPILEX BORDER 4X8 (GAUZE/BANDAGES/DRESSINGS) ×2 IMPLANT
DURAPREP 26ML APPLICATOR (WOUND CARE) ×6 IMPLANT
ELECT BLADE 6.5 EXT (BLADE) IMPLANT
ELECT CAUTERY BLADE 6.4 (BLADE) ×2 IMPLANT
ELECT REM PT RETURN 9FT ADLT (ELECTROSURGICAL) ×2
ELECTRODE REM PT RTRN 9FT ADLT (ELECTROSURGICAL) ×1 IMPLANT
FACESHIELD WRAPAROUND (MASK) ×4 IMPLANT
GLOVE BIOGEL PI ORTHO PRO SZ8 (GLOVE) ×1
GLOVE ORTHO TXT STRL SZ7.5 (GLOVE) ×2 IMPLANT
GLOVE PI ORTHO PRO STRL SZ8 (GLOVE) ×1 IMPLANT
GLOVE SURG ORTHO 8.0 STRL STRW (GLOVE) ×4 IMPLANT
GOWN STRL REUS W/ TWL XL LVL3 (GOWN DISPOSABLE) ×1 IMPLANT
GOWN STRL REUS W/TWL 2XL LVL3 (GOWN DISPOSABLE) ×2 IMPLANT
GOWN STRL REUS W/TWL XL LVL3 (GOWN DISPOSABLE) ×1
HANDPIECE INTERPULSE COAX TIP (DISPOSABLE)
KIT BASIN OR (CUSTOM PROCEDURE TRAY) ×2 IMPLANT
KIT ROOM TURNOVER OR (KITS) ×2 IMPLANT
MANIFOLD NEPTUNE II (INSTRUMENTS) IMPLANT
NDL SUT 6 .5 CRC .975X.05 MAYO (NEEDLE) IMPLANT
NEEDLE 22X1 1/2 (OR ONLY) (NEEDLE) ×2 IMPLANT
NEEDLE MAYO TAPER (NEEDLE)
NS IRRIG 1000ML POUR BTL (IV SOLUTION) ×2 IMPLANT
PACK TOTAL JOINT (CUSTOM PROCEDURE TRAY) ×2 IMPLANT
PACK UNIVERSAL I (CUSTOM PROCEDURE TRAY) ×2 IMPLANT
PAD ARMBOARD 7.5X6 YLW CONV (MISCELLANEOUS) ×4 IMPLANT
PILLOW ABDUCTION HIP (SOFTGOODS) ×2 IMPLANT
PRESSURIZER FEMORAL UNIV (MISCELLANEOUS) IMPLANT
RETRIEVER SUT HEWSON (MISCELLANEOUS) ×2 IMPLANT
SET HNDPC FAN SPRY TIP SCT (DISPOSABLE) IMPLANT
STRIP CLOSURE SKIN 1/2X4 (GAUZE/BANDAGES/DRESSINGS) ×4 IMPLANT
SUT FIBERWIRE #2 38 REV NDL BL (SUTURE) ×6
SUT MNCRL AB 4-0 PS2 18 (SUTURE) IMPLANT
SUT VIC AB 0 CT1 27 (SUTURE) ×2
SUT VIC AB 0 CT1 27XBRD ANBCTR (SUTURE) ×2 IMPLANT
SUT VIC AB 1 CT1 27 (SUTURE) ×2
SUT VIC AB 1 CT1 27XBRD ANBCTR (SUTURE) ×2 IMPLANT
SUT VIC AB 2-0 CT1 27 (SUTURE) ×2
SUT VIC AB 2-0 CT1 TAPERPNT 27 (SUTURE) ×2 IMPLANT
SUT VIC AB 3-0 SH 18 (SUTURE) ×2 IMPLANT
SUTURE FIBERWR#2 38 REV NDL BL (SUTURE) ×3 IMPLANT
SYR CONTROL 10ML LL (SYRINGE) ×2 IMPLANT
TOWEL OR 17X24 6PK STRL BLUE (TOWEL DISPOSABLE) ×2 IMPLANT
TOWEL OR 17X26 10 PK STRL BLUE (TOWEL DISPOSABLE) ×2 IMPLANT
TOWER CARTRIDGE SMART MIX (DISPOSABLE) IMPLANT
TRAY FOLEY CATH 16FRSI W/METER (SET/KITS/TRAYS/PACK) ×2 IMPLANT
WATER STERILE IRR 1000ML POUR (IV SOLUTION) ×8 IMPLANT

## 2015-12-05 NOTE — Discharge Instructions (Signed)

## 2015-12-05 NOTE — Anesthesia Postprocedure Evaluation (Signed)
Anesthesia Post Note  Patient: Daisy FolksRebecca E Pham  Procedure(s) Performed: Procedure(s) (LRB): ARTHROPLASTY BIPOLAR HIP (HEMIARTHROPLASTY) (Right)  Patient location during evaluation: PACU Anesthesia Type: General Level of consciousness: awake and alert Pain management: pain level controlled Vital Signs Assessment: post-procedure vital signs reviewed and stable Respiratory status: spontaneous breathing, nonlabored ventilation, respiratory function stable and patient connected to nasal cannula oxygen Cardiovascular status: blood pressure returned to baseline and stable Postop Assessment: no signs of nausea or vomiting Anesthetic complications: no    Last Vitals:  Vitals:   12/05/15 1538 12/05/15 1854  BP: (!) 153/62   Pulse: 86 99  Resp: 20 15  Temp: 37.1 C 36.6 C    Last Pain:  Vitals:   12/05/15 1854  TempSrc:   PainSc: Asleep                 Reino KentJudd, Deliyah Muckle J

## 2015-12-05 NOTE — ED Triage Notes (Signed)
Per EMS: pt coming from home with c/o fall. Husband got pt off the floor. Pt fell due to chronic pain, no LOC, pt c/o right leg/hip pain, outward rotation. Movement increases pain. Pt given 150 mcg fentanly, 4 mg zofran,

## 2015-12-05 NOTE — Anesthesia Procedure Notes (Signed)
Procedure Name: Intubation Date/Time: 12/05/2015 4:44 PM Performed by: Brien MatesMAHONY, Hulda D Pre-anesthesia Checklist: Patient identified, Emergency Drugs available, Suction available, Patient being monitored and Timeout performed Patient Re-evaluated:Patient Re-evaluated prior to inductionOxygen Delivery Method: Circle system utilized Preoxygenation: Pre-oxygenation with 100% oxygen Intubation Type: IV induction Ventilation: Mask ventilation without difficulty Laryngoscope Size: Miller and 2 Grade View: Grade I Tube type: Subglottic suction tube Tube size: 7.0 mm Number of attempts: 1 Airway Equipment and Method: Stylet Placement Confirmation: ETT inserted through vocal cords under direct vision,  positive ETCO2 and breath sounds checked- equal and bilateral Secured at: 21 cm Tube secured with: Tape Dental Injury: Teeth and Oropharynx as per pre-operative assessment

## 2015-12-05 NOTE — Consult Note (Signed)
ORTHOPAEDIC CONSULTATION  REQUESTING PHYSICIAN: Daisy M HobbHaydee Salters, MD  Chief Complaint: Right hip pain  HPI: [name] is a 65 y.o. female who complains of  acute severe right hip pain after what sounds like a mechanical fall yesterday. She takes a large quantity of OxyContin and oxycodone daily, and apparently "got a head" of her pain medications and this may have contributed to her fall. She complains of 10/10 pain over the right hip unable to move, difficulty with ambulation, IV pain medications improve her symptoms, and movement makes them worse. She has pre-existing severe spine problems, which has led to her opioid dependency. She is also a smoker.  Past Medical History:  Diagnosis Date  . Asthma   . Bronchitis   . COPD (chronic obstructive pulmonary disease) (HCC)   . Emphysema   . Encephalopathy 06/2014  . Hip fx, right, closed, initial encounter (HCC)   . Hyperlipidemia   . Hypertension   . Shortness of breath dyspnea   . UTI (urinary tract infection)    Past Surgical History:  Procedure Laterality Date  . ABDOMINAL HYSTERECTOMY    . APPENDECTOMY    . BACK SURGERY    . INSERTION / PLACEMENT / REVISION NEUROSTIMULATOR    . removal of neurostimulator  06/2014   Social History   Social History  . Marital status: Married    Spouse name: N/A  . Number of children: N/A  . Years of education: N/A   Occupational History  . Retired    Social History Main Topics  . Smoking status: Current Every Day Smoker    Packs/day: 1.50    Years: 30.00    Types: Cigarettes  . Smokeless tobacco: Never Used  . Alcohol use No  . Drug use: No  . Sexual activity: Not Asked   Other Topics Concern  . None   Social History Narrative  . None   Family History  Problem Relation Age of Onset  . Emphysema Sister     smoked  . Heart disease Sister   . Ovarian cancer Sister    No Known Allergies   Positive ROS: All other systems have been reviewed and were otherwise  negative with the exception of those mentioned in the HPI and as above.  Physical Exam: General: She appears slightly confused to me, in somewhat distress, laying on a gurney and writhing in pain to some degree. Cardiovascular: Mild bilateral pedal edema Respiratory: Mild cyanosis with chronic COPD-appearing changes, currently on nasal cannula GI exam: Soft, non-tender, no organomegaly  Skin: No lesions in the area of chief complaint Neurologic: Sensation intact distally Psychiatric:  patient seems to answer questions relatively appropriately, although I am not convinced that she is totally competent. Her husband is present and is helping with medical decision-making.  Lymphatic: No axillary or cervical lymphadenopathy  MUSCULOSKELETAL:  right leg is shortened and externally rotated, EHL and FHL are intact, positive log roll, positive pain to palpation proximally.  Assessment: Principal Problem:   Hip fx (HCC) Active Problems:   Essential hypertension   COPD GOLD III/ still smoking    Chronic respiratory failure with hypercapnia (HCC)   Cigarette smoker   Hyponatremia   Chronic pain   Hip fx, right, closed, initial encounter (HCC)   Right displaced femoral neck fracture.     Plan: This is an acute severe injury which threatens long-term function as well as her life.  Discussed nonsurgical versus surgical intervention. She is not adequately controlled from a pain  standpoint through nonsurgical measures and I recommended surgical intervention. Having said that, narcotic pain medications have high risk given her respiratory function, and she is at high risk for long-term intubation after surgery. I'm not sure if she is a candidate for spinal anesthetic given her previous lumbar fusion. I will defer to anesthesia. She has high risk of both for morbidity and mortality, and I discussed this with the patient and the family.   The risks benefits and alternatives were discussed with the  patient including but not limited to the risks of nonoperative treatment, versus surgical intervention including infection, bleeding, nerve injury, periprosthetic fracture, the need for revision surgery, dislocation, leg length discrepancy, blood clots, cardiopulmonary complications, morbidity, mortality, among others, and they were willing to proceed.    Eulas PostLANDAU,Bobby Barton P, MD Cell [phone]   12/05/2015 11:48 AM

## 2015-12-05 NOTE — Progress Notes (Signed)
Report given to Angie Collins-RN. All questions/concerns addressed. Will continue to monitor

## 2015-12-05 NOTE — ED Notes (Signed)
Pt back from XR c/o increased pain

## 2015-12-05 NOTE — Progress Notes (Signed)
Orthopedic Tech Progress Note Patient Details:  Daisy FolksRebecca E Pham 29-Dec-1950 [id number]  OHF w/ Trapeze applied. Patient ID: Daisy FolksRebecca E Pham, female   DOB: 29-Dec-1950, 65 y.o.   MRN: [id number]   Daisy Dupesvery S [name] 12/05/2015, 9:32 PM

## 2015-12-05 NOTE — Op Note (Signed)
12/05/2015  6:12 PM  PATIENT:  [name]   MRN: [id number]  PRE-OPERATIVE DIAGNOSIS:  Right displaced femoral neck fracture  POST-OPERATIVE DIAGNOSIS:  Same  PROCEDURE:  Procedure(s): Right hip hemiarthroplasty  PREOPERATIVE INDICATIONS:  [name] is an 65 y.o. female who was admitted 12/05/2015 with a diagnosis of Hip fx, right, closed, initial encounter [facility]) and elected for surgical management.  The risks benefits and alternatives were discussed with the patient including but not limited to the risks of nonoperative treatment, versus surgical intervention including infection, bleeding, nerve injury, periprosthetic fracture, the need for revision surgery, dislocation, leg length discrepancy, blood clots, cardiopulmonary complications, morbidity, mortality, among others, and they were willing to proceed.  Predicted outcome is good, although there will be at least a six to nine month expected recovery. She has extremely high risk factors including her end-stage COPD, as well as her extreme narcotic dependency, and also her progressive failure to thrive with weight loss and progressive weakness. She is high risk for mortality.  OPERATIVE REPORT     SURGEON:  Marchia Bond, MD    ASSISTANT:  Joya Gaskins, OPA-C  (Present throughout the entire procedure,  necessary for completion of procedure in a timely manner, assisting with retraction, instrumentation, and closure)     ANESTHESIA:  General    COMPLICATIONS:  None.      COMPONENTS:  Chemical engineer Femoral Fracture stem size 5, with a -3 spacer and a size 45 fracture head unipolar hip ball.    PROCEDURE IN DETAIL: The patient was met in the holding area and identified.  The appropriate hip  was marked at the operative site. The patient was then transported to the OR and  placed under general anesthesia.  At that point, the patient was  placed in the lateral decubitus position with the operative side up and   secured to the operating room table and all bony prominences padded.     The operative lower extremity was prepped from the iliac crest to the toes.  Sterile draping was performed.  Time out was performed prior to incision.      A routine posterolateral approach was utilized via sharp dissection  carried down to the subcutaneous tissue.  Gross bleeders were Bovie  coagulated.  The iliotibial band was identified and incised  along the length of the skin incision.  Self-retaining retractors were  inserted.  With the hip internally rotated, the short external rotators  were identified. The piriformis was tagged with FiberWire, and the hip capsule released in a T-type fashion.  The femoral neck was exposed, and I resected the femoral neck using the appropriate jig. This was performed at approximately a thumb's breadth above the lesser trochanter.    I then exposed the deep acetabulum, cleared out any tissue including the ligamentum teres, and included the hip capsule in the FiberWire used above and below the T.    I then prepared the proximal femur using the cookie-cutter, the lateralizing reamer, and then sequentially broached.  A trial utilized, and I reduced the hip and it was found to have excellent stability with functional range of motion. The trial components were then removed. I initially trialed with a size 4, which was loose after the trial, and I broached up to a 5 reduction of the trial was fairly challenging, and access to the posterior part of the hip was harder than I would've expected, but I did have excellent capsular control, I suspect the  superior capsule was fairly robust and made reduction of the prosthesis difficult.  I then placed the real implant and I impacted the real head ball into place. The hip was then reduced and taken through functional range of motion and found to have excellent stability. Leg lengths were restored.  I then used a 2 mm drill bits to pass the FiberWire  suture from the capsule and piriformis through the greater trochanter, and secured this. Excellent posterior capsular repair was achieved. I also closed the T in the capsule.  I then irrigated the hip copiously again with pulse lavage, and repaired the fascia with Vicryl, followed by Vicryl for the subcutaneous tissue, Monocryl for the skin, Steri-Strips and sterile gauze. The wounds were injected. The patient was then awakened and returned to PACU in stable and satisfactory condition. There were no complications.  Marchia Bond, MD Orthopedic Surgeon [phone]   12/05/2015 6:12 PM

## 2015-12-05 NOTE — ED Notes (Signed)
Patient transported to X-ray 

## 2015-12-05 NOTE — H&P (Signed)
History and Physical    Dustin FolksRebecca E Ojala MWU:[id number] DOB: [birth year] DOA: 12/05/2015  PCP: Delorse LekBURNETT,BRENT A, MD Patient coming from: home  Chief Complaint: hip pain after fall  HPI: Dustin FolksRebecca E Vanostrand is a 65 y.o. female with medical history significant COPD, chronic respiratory failure on 2 L nasal cannula at baseline, smoker chronic back pain secondary to multiple lumbar surgeries in the past, hypertension, hyperlipidemia presents to the emergency department chief complaint of right hip pain after a fall. Initial evaluation reveals right hip fracture.  Information is obtained from the husband who is at the bedside. He states he was asleep and heard patient call out to him. She was in the kitchen and had fallen. He reports her lower extremities have decreased sensation and she "frequently falls". He is unsure of how long she was down. He no she was up at 3 AM she was on the computer but found her at 5:30. He also speculates that she fell asleep standing indication of fell down. Of note she has a history of acute encephalopathy secondary to aspiration pneumonia in the setting of excessive lethargy due to narcotics x2. Husband denies any recent illness fever chills nausea vomiting diarrhea. Been no complaints abdominal pain dysuria hematuria frequency or urgency. Husband does report patient takes large amount of narcotics daily and that sometimes it "gets ahead of her" and this may be contributing to a fall. He states recently the dosages have been reduced by PCP. He reports OxyContin reduced from 20 mg every 12 hours to 15 and OxyIR reduced from 10 mg to 5 mg every 8 hours  ED Course: In the emergency department she's afebrile hemodynamically stable with oxygen saturation level 99% on 2 L. Somewhat lethargic but easily rales  Review of Systems: As per HPI otherwise 10 point review of systems negative.   Ambulatory Status: Uses a cane on occasion at home. Has unsteady gait due to chronic low back  pain in the setting of chronic narcotic use and falls frequently  Past Medical History:  Diagnosis Date  . Asthma   . Bronchitis   . COPD (chronic obstructive pulmonary disease) (HCC)   . Emphysema   . Encephalopathy 06/2014  . Hip fx, right, closed, initial encounter (HCC)   . Hyperlipidemia   . Hypertension   . Shortness of breath dyspnea   . UTI (urinary tract infection)     Past Surgical History:  Procedure Laterality Date  . ABDOMINAL HYSTERECTOMY    . APPENDECTOMY    . BACK SURGERY    . INSERTION / PLACEMENT / REVISION NEUROSTIMULATOR    . removal of neurostimulator  06/2014    Social History   Social History  . Marital status: Married    Spouse name: N/A  . Number of children: N/A  . Years of education: N/A   Occupational History  . Retired    Social History Main Topics  . Smoking status: Current Every Day Smoker    Packs/day: 1.50    Years: 30.00    Types: Cigarettes  . Smokeless tobacco: Never Used  . Alcohol use No  . Drug use: No  . Sexual activity: Not on file   Other Topics Concern  . Not on file   Social History Narrative  . No narrative on file    No Known Allergies  Family History  Problem Relation Age of Onset  . Emphysema Sister     smoked  . Heart disease Sister   . Ovarian cancer Sister  Prior to Admission medications   Medication Sig Start Date End Date Taking? Authorizing Provider  albuterol (PROAIR HFA) 108 (90 BASE) MCG/ACT inhaler 2 puffs every 4 hours as needed only  if your can't catch your breath Patient taking differently: 2 puffs every 4 hours as needed for wheezing and shortness of breath ((PLAN B)) 04/02/14   Nyoka CowdenMichael B Wert, MD  albuterol (PROVENTIL) (2.5 MG/3ML) 0.083% nebulizer solution Take 3 mLs (2.5 mg total) by nebulization every 6 (six) hours as needed for wheezing or shortness of breath. Patient taking differently: Take 2.5 mg by nebulization every 4 (four) hours as needed for wheezing or shortness of breath  (((PLAN C))).  04/02/14   Nyoka CowdenMichael B Wert, MD  aspirin 81 MG tablet Take 81 mg by mouth every morning.     Historical Provider, MD  azithromycin (ZITHROMAX) 250 MG tablet Take 2 on day one then 1 daily x 4 days 06/10/15   Nyoka CowdenMichael B Wert, MD  baclofen (LIORESAL) 10 MG tablet Take 10 mg by mouth at bedtime as needed for muscle spasms.    Historical Provider, MD  budesonide-formoterol (SYMBICORT) 160-4.5 MCG/ACT inhaler Inhale 2 puffs into the lungs 2 (two) times daily.    Historical Provider, MD  citalopram (CELEXA) 20 MG tablet Take 1 tablet by mouth every morning. 08/12/14   Historical Provider, MD  cloNIDine (CATAPRES) 0.1 MG tablet Take 1 tablet (0.1 mg total) by mouth 2 (two) times daily. 07/13/14   Jeanella CrazeBrandi L Ollis, NP  diclofenac (VOLTAREN) 75 MG EC tablet Take 1 tablet (75 mg total) by mouth 2 (two) times daily. 09/05/15   Dione Boozeavid Glick, MD  Multiple Vitamin (MULTIVITAMIN WITH MINERALS) TABS Take 1 tablet by mouth every morning.     Historical Provider, MD  oxyCODONE (OXY IR/ROXICODONE) 5 MG immediate release tablet Take 1 tablet (5 mg total) by mouth every 6 (six) hours as needed for severe pain or breakthrough pain. Patient taking differently: Take 5 mg by mouth 3 (three) times daily as needed for severe pain or breakthrough pain.  06/02/14   Vassie Lollarlos Madera, MD  OxyCODONE (OXYCONTIN) 15 mg T12A 12 hr tablet Take 1 tablet (15 mg total) by mouth every 12 (twelve) hours. 06/02/14   Vassie Lollarlos Madera, MD  OXYGEN Wear 2L at bedtime and with activity as needed for shortness of breath    Historical Provider, MD  polyethylene glycol (MIRALAX / GLYCOLAX) packet Take 17 g by mouth daily as needed for mild constipation. 07/13/14   Jeanella CrazeBrandi L Ollis, NP  POTASSIUM GLUCONATE PO Take 1 tablet by mouth daily with breakfast. Take every morning    Historical Provider, MD  pravastatin (PRAVACHOL) 40 MG tablet Take 40 mg by mouth at bedtime.     Historical Provider, MD  predniSONE (DELTASONE) 50 MG tablet Take 1 tablet (50 mg total) by  mouth daily. 09/05/15   Dione Boozeavid Glick, MD  promethazine (PHENERGAN) 25 MG tablet Take 25 mg by mouth every 6 (six) hours as needed for nausea or vomiting.    Historical Provider, MD  ranitidine (ZANTAC) 150 MG tablet Take 150 mg by mouth at bedtime.    Historical Provider, MD  rOPINIRole (REQUIP) 0.5 MG tablet Take 0.5 mg by mouth at bedtime.    Historical Provider, MD  sodium chloride (OCEAN) 0.65 % SOLN nasal spray Place 1 spray into both nostrils as needed for congestion. Patient taking differently: Place 1 spray into both nostrils as needed (nasal stuffiness).  07/13/14   Jeanella CrazeBrandi L Ollis, NP  varenicline (CHANTIX PAK) 0.5 MG X 11 & 1 MG X 42 tablet Take one 0.5 mg tablet by mouth once daily for 3 days, then increase to one 0.5 mg tablet twice daily for 4 days, then increase to one 1 mg tablet twice daily. 06/10/15   Nyoka Cowden, MD    Physical Exam: Vitals:   12/05/15 1030 12/05/15 1045 12/05/15 1100 12/05/15 1115  BP: 163/76 163/69 143/69 144/69  Pulse: 88 86 89 87  Resp:  14 11 11   Temp:      TempSrc:      SpO2: 98% 100% 99% 100%     General:  Appears Somewhat lethargic thin and frail chronically ill somewhat uncomfortable when awake Eyes:  PERRL, EOMI, normal lids, iris ENT:  grossly normal hearing, lips & tongue, mucous membranes of her mouth are moist and pink very dentition Neck:  no LAD, masses or thyromegaly Cardiovascular:  RRR, no m/r/g. No LE edema. Pedal pulses present and palpable Respiratory:  Normal effort breath sounds diminished and somewhat coarse throughout, no wheeze or crackles Abdomen:  soft, ntnd, positive bowel sounds no guarding or rebounding Skin:  no rash or induration seen on limited exam Musculoskeletal:  Leg externally rotated somewhat shorter right hip tender to the touch decreased range of motion secondary to pain Psychiatric:  grossly normal mood and affect, speech fluent and appropriate, AOx3 Neurologic:  Somewhat lethargic but easily aroused. Oriented  to self and place when awake. Fidgety when awake and some twitching of shoulders when sleeping attempts to follow commands  Labs on Admission: I have personally reviewed following labs and imaging studies  CBC:  Recent Labs Lab 12/05/15 0925  WBC 12.8*  NEUTROABS 9.9*  HGB 13.0  HCT 38.1  MCV 88.0  PLT 222   Basic Metabolic Panel:  Recent Labs Lab 12/05/15 0925  NA 127*  K 4.5  CL 93*  CO2 27  GLUCOSE 87  BUN 10  CREATININE 0.50  CALCIUM 9.1   GFR: CrCl cannot be calculated (Unknown ideal weight.). Liver Function Tests: No results for input(s): AST, ALT, ALKPHOS, BILITOT, PROT, ALBUMIN in the last 168 hours. No results for input(s): LIPASE, AMYLASE in the last 168 hours. No results for input(s): AMMONIA in the last 168 hours. Coagulation Profile:  Recent Labs Lab 12/05/15 0925  INR 0.99   Cardiac Enzymes: No results for input(s): CKTOTAL, CKMB, CKMBINDEX, TROPONINI in the last 168 hours. BNP (last 3 results) No results for input(s): PROBNP in the last 8760 hours. HbA1C: No results for input(s): HGBA1C in the last 72 hours. CBG: No results for input(s): GLUCAP in the last 168 hours. Lipid Profile: No results for input(s): CHOL, HDL, LDLCALC, TRIG, CHOLHDL, LDLDIRECT in the last 72 hours. Thyroid Function Tests: No results for input(s): TSH, T4TOTAL, FREET4, T3FREE, THYROIDAB in the last 72 hours. Anemia Panel: No results for input(s): VITAMINB12, FOLATE, FERRITIN, TIBC, IRON, RETICCTPCT in the last 72 hours. Urine analysis:    Component Value Date/Time   COLORURINE AMBER (A) 04/01/2015 1228   APPEARANCEUR CLEAR 04/01/2015 1228   LABSPEC 1.018 04/01/2015 1228   PHURINE 7.0 04/01/2015 1228   GLUCOSEU NEGATIVE 04/01/2015 1228   HGBUR TRACE (A) 04/01/2015 1228   BILIRUBINUR NEGATIVE 04/01/2015 1228   KETONESUR NEGATIVE 04/01/2015 1228   PROTEINUR >300 (A) 04/01/2015 1228   UROBILINOGEN 1.0 07/06/2014 1116   NITRITE NEGATIVE 04/01/2015 1228    LEUKOCYTESUR TRACE (A) 04/01/2015 1228    Creatinine Clearance: CrCl cannot be calculated (Unknown ideal  weight.).  Sepsis Labs: @LABRCNTIP (procalcitonin:4,lacticidven:4) )No results found for this or any previous visit (from the past 240 hour(s)).   Radiological Exams on Admission: Dg Pelvis 1-2 Views  Result Date: 12/05/2015 CLINICAL DATA:  Severe right hip pain following a fall today. EXAM: PELVIS - 1-2 VIEW COMPARISON:  Pelvis CT dated 07/04/2013. FINDINGS: Again demonstrated is lower lumbar spine and right pelvic fixation hardware. An interval right femoral neck fracture is demonstrated with varus angulation and mild proximal displacement of the distal fragment. Diffuse osteopenia is noted. Prominent stool in the colon. IMPRESSION: Acute right femoral neck fracture. Electronically Signed   By: Beckie Salts M.D.   On: 12/05/2015 10:11   Dg Femur, Min 2 Views Right  Result Date: 12/05/2015 CLINICAL DATA:  Fall EXAM: RIGHT FEMUR 2 VIEWS COMPARISON:  None. FINDINGS: There is a displaced fracture of the right femoral neck. Fracture occurs in the mid neck region. There is some rotation at the fracture site. IMPRESSION: Displaced acute right femoral neck fracture. Electronically Signed   By: Jolaine Click M.D.   On: 12/05/2015 10:09    EKG: Independently reviewed. Sinus rhythm Right atrial enlargement Right bundle branch block No significant change since last tracing  Assessment/Plan Principal Problem:   Hip fx (HCC) Active Problems:   Essential hypertension   COPD GOLD III/ still smoking    Chronic respiratory failure with hypercapnia (HCC)   Cigarette smoker   Hyponatremia   Chronic pain   Hip fx, right, closed, initial encounter (HCC)   #1. Right hip fracture presumably from mechanical fall in the setting of heavy narcotic use. X-ray shows displaced acute right femoral neck fracture. Orthopedics on board -Surgery today -Nothing by mouth -Pain management keeping in mind  respiratory status in setting of surgery. Will minimize sedating meds as able -hx copd Gold III. Will give nebulizer pre-operatively   #2. COPD Gold III. patient on home oxygen at baseline. Meds include inhaler and nebulizers.  Chest x-ray pending at time of admission.  -Oxygen supplementation -Continue inhalers and nebulizers -change nebulizers to scheduled for now -monitor oxygen saturation level -of note, last admission patient on vent related to narcotic use and husband indicates it took "3-4 days to get her off".  #3. Chronic respiratory failure with hypercapnia. See #2. Chart review indicates she saw pulmonology May of this year. At that time patient walked on room air 185 feet and descended 88%. He recommended oxygen 2 L when necessary daytime and at bedtime. Also provided prednisone taper at that time. Does not appear to be on chronic prednisone. Appears stable at baseline -See #2  #4. Hypertension. Fair control in the emergency department. Home medications include clonidine -continue clonidine -monitor  5. Hyponatremia. Sodium level 127. Chart review indicates some chronic hyponatremia however this level is a bit below baseline. May be related to decreased oral intake. -IV fluids -urine sodium and osmolality -serum osmolality -monitor  #6. Tobacco use. Cessation counseling offered  #7. Chronic pain. Home medications include oxycodone 5 mg every 8 hours as needed as well as OxyContin 15 mg every 12 hours. Patient appears quite lethargic on admission. Has a history of acute encephalopathy secondary to overmedication. Experienced withdrawals during recent hospitalization on the ventilator -We'll continue her home regimen at lower dosage -Monitor pain closely. Oversedation and/or withdrawal  DVT prophylaxis: scd Code Status: full  Family Communication: husband at bedside  Disposition Plan: snf  Consults called: dr Dion Saucier  Admission status: inpatient    Gwenyth Bender  MD [facility]  If 7PM-7AM, please contact night-coverage [email or web address] Password [email or web address]  12/05/2015, 11:58 AM

## 2015-12-05 NOTE — Transfer of Care (Signed)
Immediate Anesthesia Transfer of Care Note  Patient: Daisy FolksRebecca E Riolo  Procedure(s) Performed: Procedure(s): ARTHROPLASTY BIPOLAR HIP (HEMIARTHROPLASTY) (Right)  Patient Location: PACU  Anesthesia Type:General  Level of Consciousness: sedated  Airway & Oxygen Therapy: Patient Spontanous Breathing and Patient connected to face mask oxygen  Post-op Assessment: Report given to RN and Post -op Vital signs reviewed and stable  Post vital signs: Reviewed and stable  Last Vitals:  Vitals:   12/05/15 1538 12/05/15 1854  BP: (!) 153/62   Pulse: 86 (P) 99  Resp: 20 (P) 15  Temp: 37.1 C (P) 36.6 C    Last Pain:  Vitals:   12/05/15 1538  TempSrc: Oral  PainSc:          Complications: No apparent anesthesia complications

## 2015-12-05 NOTE — Anesthesia Preprocedure Evaluation (Addendum)
Anesthesia Evaluation  Patient identified by MRN, date of birth, ID band Patient awake    Reviewed: Allergy & Precautions, H&P , NPO status , Patient's Chart, lab work & pertinent test results  History of Anesthesia Complications Negative for: history of anesthetic complications  Airway Mallampati: II  TM Distance: >3 FB Neck ROM: full    Dental no notable dental hx. (+) Edentulous Upper, Edentulous Lower, Dental Advisory Given   Pulmonary asthma , COPD, Current Smoker,    Pulmonary exam normal breath sounds clear to auscultation       Cardiovascular hypertension, Normal cardiovascular exam Rhythm:regular Rate:Normal     Neuro/Psych PSYCHIATRIC DISORDERS chornic pain, narcotic dependencynegative neurological ROS     GI/Hepatic negative GI ROS, Neg liver ROS,   Endo/Other  negative endocrine ROS  Renal/GU negative Renal ROS     Musculoskeletal   Abdominal   Peds  Hematology negative hematology ROS (+)   Anesthesia Other Findings Chronically hyponatremic, currently 127 and was 131 in Feb 2017, will give normal saline and proceed with urgent case give chronicity of hyponatremia  Reproductive/Obstetrics negative OB ROS                           Anesthesia Physical Anesthesia Plan  ASA: III  Anesthesia Plan: General and Spinal   Post-op Pain Management:    Induction: Intravenous  Airway Management Planned: Oral ETT  Additional Equipment:   Intra-op Plan:   Post-operative Plan: Possible Post-op intubation/ventilation  Informed Consent: I have reviewed the patients History and Physical, chart, labs and discussed the procedure including the risks, benefits and alternatives for the proposed anesthesia with the patient or authorized representative who has indicated his/her understanding and acceptance.   Dental Advisory Given  Plan Discussed with: Anesthesiologist, CRNA and  Surgeon  Anesthesia Plan Comments: (May offer spinal depending on extent of previous back surgery)     Anesthesia Quick Evaluation

## 2015-12-05 NOTE — ED Provider Notes (Signed)
[facility]-EMERGENCY DEPT Provider Note   CSN: [id number] Arrival date & time: 12/05/15  0906     History   Chief Complaint Chief Complaint  Patient presents with  . Fall  . Hip Pain    HPI [name] is a 65 y.o. female.  HPI  65 year old female presents after a fall and with right hip pain. Patient states that she has trouble sleeping chronically and was up walking around early in the morning. While she was standing up at around 5:30 she thinks she fell asleep and fell down. Landed on her right hip on the floor. Did not hit her head or lose consciousness. Severe pain ever since. Husband had to get her up she cannot get up on her own. Tells me she has had a prior hip fracture on that side. Was given 150 g of fentanyl and 4 mg Zofran by EMS with partial relief. No numbness or weakness.  Past Medical History:  Diagnosis Date  . Asthma   . Bronchitis   . COPD (chronic obstructive pulmonary disease) (HCC)   . Emphysema   . Encephalopathy 06/2014  . Hip fx, right, closed, initial encounter (HCC)   . Hyperlipidemia   . Hypertension   . Shortness of breath dyspnea   . UTI (urinary tract infection)     Patient Active Problem List   Diagnosis Date Noted  . Hip fx (HCC)   . Pressure ulcer 07/07/2014  . Altered mental state   . Change in mental state   . Fever   . Acute encephalopathy 07/06/2014  . Narcotic overdose   . Hyponatremia 05/31/2014  . Lethargic 05/31/2014  . Chronic pain 05/31/2014  . Sepsis (HCC) 05/31/2014  . COPD GOLD III/ still smoking  04/02/2014  . Chronic respiratory failure with hypercapnia (HCC) 04/02/2014  . Cigarette smoker 04/02/2014  . COPD exacerbation (HCC) 02/12/2012  . Essential hypertension 02/12/2012  . Tachycardia 02/12/2012    Past Surgical History:  Procedure Laterality Date  . ABDOMINAL HYSTERECTOMY    . APPENDECTOMY    . BACK SURGERY    . INSERTION / PLACEMENT / REVISION NEUROSTIMULATOR    . removal of neurostimulator  06/2014      OB History    No data available       Home Medications    Prior to Admission medications   Medication Sig Start Date End Date Taking? Authorizing Provider  albuterol (PROAIR HFA) 108 (90 BASE) MCG/ACT inhaler 2 puffs every 4 hours as needed only  if your can't catch your breath Patient taking differently: 2 puffs every 4 hours as needed for wheezing and shortness of breath ((PLAN B)) 04/02/14   Nyoka CowdenMichael B Wert, MD  albuterol (PROVENTIL) (2.5 MG/3ML) 0.083% nebulizer solution Take 3 mLs (2.5 mg total) by nebulization every 6 (six) hours as needed for wheezing or shortness of breath. Patient taking differently: Take 2.5 mg by nebulization every 4 (four) hours as needed for wheezing or shortness of breath (((PLAN C))).  04/02/14   Nyoka CowdenMichael B Wert, MD  aspirin 81 MG tablet Take 81 mg by mouth every morning.     Historical Provider, MD  azithromycin (ZITHROMAX) 250 MG tablet Take 2 on day one then 1 daily x 4 days 06/10/15   Nyoka CowdenMichael B Wert, MD  baclofen (LIORESAL) 10 MG tablet Take 10 mg by mouth at bedtime as needed for muscle spasms.    Historical Provider, MD  budesonide-formoterol (SYMBICORT) 160-4.5 MCG/ACT inhaler Inhale 2 puffs into the lungs 2 (  two) times daily.    Historical Provider, MD  citalopram (CELEXA) 20 MG tablet Take 1 tablet by mouth every morning. 08/12/14   Historical Provider, MD  cloNIDine (CATAPRES) 0.1 MG tablet Take 1 tablet (0.1 mg total) by mouth 2 (two) times daily. 07/13/14   Jeanella Craze, NP  diclofenac (VOLTAREN) 75 MG EC tablet Take 1 tablet (75 mg total) by mouth 2 (two) times daily. 09/05/15   Dione Booze, MD  Multiple Vitamin (MULTIVITAMIN WITH MINERALS) TABS Take 1 tablet by mouth every morning.     Historical Provider, MD  oxyCODONE (OXY IR/ROXICODONE) 5 MG immediate release tablet Take 1 tablet (5 mg total) by mouth every 6 (six) hours as needed for severe pain or breakthrough pain. Patient taking differently: Take 5 mg by mouth 3 (three) times daily as needed  for severe pain or breakthrough pain.  06/02/14   Vassie Loll, MD  OxyCODONE (OXYCONTIN) 15 mg T12A 12 hr tablet Take 1 tablet (15 mg total) by mouth every 12 (twelve) hours. 06/02/14   Vassie Loll, MD  OXYGEN Wear 2L at bedtime and with activity as needed for shortness of breath    Historical Provider, MD  polyethylene glycol (MIRALAX / GLYCOLAX) packet Take 17 g by mouth daily as needed for mild constipation. 07/13/14   Jeanella Craze, NP  POTASSIUM GLUCONATE PO Take 1 tablet by mouth daily with breakfast. Take every morning    Historical Provider, MD  pravastatin (PRAVACHOL) 40 MG tablet Take 40 mg by mouth at bedtime.     Historical Provider, MD  predniSONE (DELTASONE) 50 MG tablet Take 1 tablet (50 mg total) by mouth daily. 09/05/15   Dione Booze, MD  promethazine (PHENERGAN) 25 MG tablet Take 25 mg by mouth every 6 (six) hours as needed for nausea or vomiting.    Historical Provider, MD  ranitidine (ZANTAC) 150 MG tablet Take 150 mg by mouth at bedtime.    Historical Provider, MD  rOPINIRole (REQUIP) 0.5 MG tablet Take 0.5 mg by mouth at bedtime.    Historical Provider, MD  sodium chloride (OCEAN) 0.65 % SOLN nasal spray Place 1 spray into both nostrils as needed for congestion. Patient taking differently: Place 1 spray into both nostrils as needed (nasal stuffiness).  07/13/14   Jeanella Craze, NP  varenicline (CHANTIX PAK) 0.5 MG X 11 & 1 MG X 42 tablet Take one 0.5 mg tablet by mouth once daily for 3 days, then increase to one 0.5 mg tablet twice daily for 4 days, then increase to one 1 mg tablet twice daily. 06/10/15   Nyoka Cowden, MD    Family History Family History  Problem Relation Age of Onset  . Emphysema Sister     smoked  . Heart disease Sister   . Ovarian cancer Sister     Social History Social History  Substance Use Topics  . Smoking status: Current Every Day Smoker    Packs/day: 1.50    Years: 30.00    Types: Cigarettes  . Smokeless tobacco: Never Used  . Alcohol use  No     Allergies   Review of patient's allergies indicates no known allergies.   Review of Systems Review of Systems  Cardiovascular: Negative for chest pain.  Musculoskeletal: Positive for arthralgias.  Neurological: Negative for dizziness, weakness, numbness and headaches.  All other systems reviewed and are negative.    Physical Exam Updated Vital Signs BP 144/69   Pulse 87   Temp 98.2 F (  36.8 C) (Oral)   Resp 11   SpO2 100%   Physical Exam  Constitutional: She is oriented to person, place, and time. She appears well-developed and well-nourished. No distress.  HENT:  Head: Normocephalic and atraumatic.  Right Ear: External ear normal.  Left Ear: External ear normal.  Nose: Nose normal.  Eyes: Right eye exhibits no discharge. Left eye exhibits no discharge.  Cardiovascular: Normal rate and regular rhythm.   Pulses:      Dorsalis pedis pulses are 2+ on the right side, and 2+ on the left side.  Pulmonary/Chest: Effort normal and breath sounds normal.  Abdominal: She exhibits no distension.  Musculoskeletal:       Right hip: She exhibits decreased range of motion and tenderness.       Right knee: No tenderness found.       Right upper leg: She exhibits no tenderness.       Right lower leg: She exhibits swelling (mild compared to left). She exhibits no tenderness.  Right leg is mildly shortened and externally rotated  Neurological: She is alert and oriented to person, place, and time.  Skin: Skin is warm and dry. She is not diaphoretic.  Nursing note and vitals reviewed.    ED Treatments / Results  Labs (all labs ordered are listed, but only abnormal results are displayed) Labs Reviewed  BASIC METABOLIC PANEL - Abnormal; Notable for the following:       Result Value   Sodium 127 (*)    Chloride 93 (*)    All other components within normal limits  CBC WITH DIFFERENTIAL/PLATELET - Abnormal; Notable for the following:    WBC 12.8 (*)    Neutro Abs 9.9 (*)     All other components within normal limits  PROTIME-INR  TYPE AND SCREEN    EKG  EKG Interpretation  Date/Time:  Sunday December 05 2015 10:11:12 EDT Ventricular Rate:  92 PR Interval:    QRS Duration: 140 QT Interval:  398 QTC Calculation: 493 R Axis:   56 Text Interpretation:  Sinus rhythm Right atrial enlargement Right bundle branch block No significant change since last tracing Confirmed by Aeon Koors MD, Jatniel Verastegui [id number]) on 12/05/2015 10:56:24 AM       Radiology Dg Pelvis 1-2 Views  Result Date: 12/05/2015 CLINICAL DATA:  Severe right hip pain following a fall today. EXAM: PELVIS - 1-2 VIEW COMPARISON:  Pelvis CT dated 07/04/2013. FINDINGS: Again demonstrated is lower lumbar spine and right pelvic fixation hardware. An interval right femoral neck fracture is demonstrated with varus angulation and mild proximal displacement of the distal fragment. Diffuse osteopenia is noted. Prominent stool in the colon. IMPRESSION: Acute right femoral neck fracture. Electronically Signed   By: Beckie Salts M.D.   On: 12/05/2015 10:11   Dg Femur, Min 2 Views Right  Result Date: 12/05/2015 CLINICAL DATA:  Fall EXAM: RIGHT FEMUR 2 VIEWS COMPARISON:  None. FINDINGS: There is a displaced fracture of the right femoral neck. Fracture occurs in the mid neck region. There is some rotation at the fracture site. IMPRESSION: Displaced acute right femoral neck fracture. Electronically Signed   By: Jolaine Click M.D.   On: 12/05/2015 10:09    Procedures Procedures (including critical care time)  Medications Ordered in ED Medications  fentaNYL (SUBLIMAZE) injection 100 mcg (100 mcg Intravenous Given 12/05/15 1045)  ondansetron (ZOFRAN) injection 4 mg (4 mg Intravenous Given 12/05/15 0923)  HYDROmorphone (DILAUDID) injection 1 mg (1 mg Intravenous Given 12/05/15 1003)  Initial Impression / Assessment and Plan / ED Course  I have reviewed the triage vital signs and the nursing notes.  Pertinent labs &  imaging results that were available during my care of the patient were reviewed by me and considered in my medical decision making (see chart for details).  Clinical Course  Comment By Time  My concern is for a right hip fracture. She is neurovascularly intact. Does not appear to have any other focal findings and did not hit her head or lose consciousness. Pricilla LovelessScott Jakylah Bassinger, MD 10/29 0913  Femoral neck fracture. Will try fentanyl since the dilaudid is not helping and she appeared much more calm on arrival after fentanyl. Consult ortho. Her orthopedist is Dr. Charlett BlakeVoytek. Pricilla LovelessScott Richmond Coldren, MD 10/29 1030  Dr. Dion SaucierLandau to come see. Keep NPO, try to do operation today. Pricilla LovelessScott Dyami Umbach, MD 10/29 1102  D/w Toya SmothersKaren Black, triad to admit, med surg inpatient. Pricilla LovelessScott Cassey Bacigalupo, MD 10/29 1126    Final Clinical Impressions(s) / ED Diagnoses   Final diagnoses:  Closed fracture of neck of right femur, initial encounter [facility](HCC)    New Prescriptions New Prescriptions   No medications on file     Pricilla LovelessScott Princess Karnes, MD 12/05/15 1127

## 2015-12-06 ENCOUNTER — Encounter (HOSPITAL_COMMUNITY): Payer: Self-pay | Admitting: Orthopedic Surgery

## 2015-12-06 LAB — CBC
HEMATOCRIT: 29.6 % — AB (ref 36.0–46.0)
Hemoglobin: 9.9 g/dL — ABNORMAL LOW (ref 12.0–15.0)
MCH: 29.6 pg (ref 26.0–34.0)
MCHC: 33.4 g/dL (ref 30.0–36.0)
MCV: 88.6 fL (ref 78.0–100.0)
Platelets: 174 10*3/uL (ref 150–400)
RBC: 3.34 MIL/uL — AB (ref 3.87–5.11)
RDW: 12.8 % (ref 11.5–15.5)
WBC: 10.6 10*3/uL — AB (ref 4.0–10.5)

## 2015-12-06 LAB — BASIC METABOLIC PANEL
ANION GAP: 6 (ref 5–15)
BUN: 7 mg/dL (ref 6–20)
CALCIUM: 8.3 mg/dL — AB (ref 8.9–10.3)
CO2: 27 mmol/L (ref 22–32)
Chloride: 97 mmol/L — ABNORMAL LOW (ref 101–111)
Creatinine, Ser: 0.48 mg/dL (ref 0.44–1.00)
Glucose, Bld: 102 mg/dL — ABNORMAL HIGH (ref 65–99)
POTASSIUM: 4.3 mmol/L (ref 3.5–5.1)
Sodium: 130 mmol/L — ABNORMAL LOW (ref 135–145)

## 2015-12-06 MED ORDER — IPRATROPIUM-ALBUTEROL 0.5-2.5 (3) MG/3ML IN SOLN
3.0000 mL | Freq: Four times a day (QID) | RESPIRATORY_TRACT | Status: DC
  Administered 2015-12-06 (×2): 3 mL via RESPIRATORY_TRACT
  Filled 2015-12-06 (×2): qty 3

## 2015-12-06 MED ORDER — POLYETHYLENE GLYCOL 3350 17 G PO PACK
17.0000 g | PACK | Freq: Every day | ORAL | Status: DC
  Administered 2015-12-06 – 2015-12-07 (×2): 17 g via ORAL
  Filled 2015-12-06 (×2): qty 1

## 2015-12-06 MED ORDER — KETOROLAC TROMETHAMINE 15 MG/ML IJ SOLN
15.0000 mg | Freq: Four times a day (QID) | INTRAMUSCULAR | Status: DC | PRN
  Administered 2015-12-07 – 2015-12-08 (×4): 15 mg via INTRAVENOUS
  Filled 2015-12-06 (×4): qty 1

## 2015-12-06 NOTE — Evaluation (Signed)
Occupational Therapy Evaluation Patient Details Name: Daisy FolksRebecca E Vannest MRN: [id number] DOB: [birth year] Today's Date: 12/06/2015    History of Present Illness Pt is a 65 y.o. female s/p R hip hemiarthroplasty secondary to R displaced femoral neck fracture due to a fall. Pt has a past medical history significant for COPD, chronic respiratory failure on @ L nasal cannula at baseline, HTN, hyperlipidemia. Pt with history of chronic pain and daily OxyContin and oxycodone use which may have impacted fall.   Clinical Impression   PTA, pt utilized cane and rollator at times for functional mobility. Pt was independent with ADL. Pt currently requires max assist with LB ADL and mod assist for functional mobility. Educated pt and son on hip precautions and potential use of AE to adhere to these while performing LB ADL. Pt lives with husband and has granddaughter who lives in her basement who may be able to provide 24 hour assistance but pt is unsure. At this time, recommend short term SNF placement for continued rehabilitation services post-acute D/C to improve independence and safety with ADL. Pt would benefit from continued OT services while admitted to improve independence with ADL and functional mobility. OT will continue to follow acutely.    Follow Up Recommendations  SNF;Supervision/Assistance - 24 hour;Other (comment) (If pt refuses SNF, will need HHOT)    Equipment Recommendations  Other (comment) (TBD at next venue; if home will need 3-in-1)    Recommendations for Other Services       Precautions / Restrictions Precautions Precautions: Posterior Hip;Other (comment) (Hip hemiarthroplasty) Precaution Booklet Issued: No Precaution Comments: Reviewed no bending past 90 degrees, no internal rotation, and no abduction of R hip. Restrictions Weight Bearing Restrictions: Yes RLE Weight Bearing: Weight bearing as tolerated Other Position/Activity Restrictions: posterior hip precautions       Mobility Bed Mobility               General bed mobility comments: Out of bed in chair on OT arrival.  Transfers Overall transfer level: Needs assistance   Transfers: Sit to/from Stand Sit to Stand: Mod assist         General transfer comment: Mod assist to stand from chair and BSC while adhering to hip precautions.    Balance Overall balance assessment: Needs assistance Sitting-balance support: Feet supported;No upper extremity supported Sitting balance-Leahy Scale: Fair     Standing balance support: During functional activity;Single extremity supported Standing balance-Leahy Scale: Poor Standing balance comment: Able to remove single UE from RW for toileting hygeine with min assist to maintain balance.                            ADL Overall ADL's : Needs assistance/impaired     Grooming: Set up;Sitting   Upper Body Bathing: Set up;Sitting   Lower Body Bathing: Maximal assistance;Sit to/from stand;Adhering to hip precautions   Upper Body Dressing : Set up;Sitting   Lower Body Dressing: Maximal assistance;Sit to/from stand;Adhering to hip precautions   Toilet Transfer: Moderate assistance;Ambulation;RW;BSC   Toileting- ArchitectClothing Manipulation and Hygiene: Sit to/from stand;Moderate assistance       Functional mobility during ADLs: Moderate assistance;Rolling walker General ADL Comments: Pt and son educated on hip precautions and use of AE to adhere to these while performing LB ADL.     Vision Vision Assessment?: No apparent visual deficits   Perception     Praxis      Pertinent Vitals/Pain Pain Assessment: 0-10 Pain Score: 5  Pain Location: R hip Pain Descriptors / Indicators: Aching;Grimacing;Guarding;Sore Pain Intervention(s): Limited activity within patient's tolerance;Monitored during session;Repositioned     Hand Dominance Right   Extremity/Trunk Assessment Upper Extremity Assessment Upper Extremity Assessment: Overall WFL  for tasks assessed   Lower Extremity Assessment Lower Extremity Assessment: RLE deficits/detail RLE Deficits / Details: Decreased strength and ROM as expected post-operatively and s/p R hip fracture.       Communication Communication Communication: No difficulties   Cognition Arousal/Alertness: Awake/alert Behavior During Therapy: WFL for tasks assessed/performed Overall Cognitive Status: Within Functional Limits for tasks assessed                     General Comments       Exercises       Shoulder Instructions      Home Living Family/patient expects to be discharged to:: Private residence Living Arrangements: Spouse/significant other Available Help at Discharge: Family;Available PRN/intermittently Type of Home: House Home Access: Stairs to enter Entergy CorporationEntrance Stairs-Number of Steps: 2 Entrance Stairs-Rails: None Home Layout: One level;Able to live on main level with bedroom/bathroom (Has a partial second level where sewing room is located)     Bathroom Shower/Tub: Walk-in shower;Door   Foot LockerBathroom Toilet: Standard     Home Equipment: Walker - standard;Cane - single point;Other (comment);Hand held shower head (Rollator)          Prior Functioning/Environment Level of Independence: Independent with assistive device(s)        Comments: Using rollator and cane at times for ambulation        OT Problem List: Decreased activity tolerance;Decreased strength;Decreased range of motion;Impaired balance (sitting and/or standing);Decreased safety awareness;Decreased knowledge of use of DME or AE;Decreased knowledge of precautions;Pain   OT Treatment/Interventions: Self-care/ADL training;Therapeutic exercise;Therapeutic activities;Balance training;Patient/family education    OT Goals(Current goals can be found in the care plan section) Acute Rehab OT Goals Patient Stated Goal: to go home OT Goal Formulation: With patient Time For Goal Achievement: 12/20/15 Potential  to Achieve Goals: Good ADL Goals Pt Will Perform Lower Body Bathing: with adaptive equipment;sit to/from stand;with supervision Pt Will Perform Lower Body Dressing: with adaptive equipment;sit to/from stand;with supervision Pt Will Transfer to Toilet: ambulating;bedside commode;with supervision Pt Will Perform Tub/Shower Transfer: with min guard assist;3 in 1;rolling walker;Shower transfer Additional ADL Goal #1: Pt will independently verbalize 3/3 hip precautions and adhere to these during LB dressing.  OT Frequency: Min 2X/week   Barriers to D/C:            Co-evaluation              End of Session Equipment Utilized During Treatment: Gait belt;Rolling walker;Oxygen (2 L O2)  Activity Tolerance: Patient tolerated treatment well Patient left: in chair;with call bell/phone within reach   Time: 4132-44011304-1324 OT Time Calculation (min): 20 min Charges:  OT General Charges $OT Visit: 1 Procedure OT Evaluation $OT Eval Moderate Complexity: 1 Procedure  Doristine SectionCharity A Edoardo Laforte, OTR/L [phone] 12/06/2015, 1:42 PM

## 2015-12-06 NOTE — Evaluation (Signed)
Physical Therapy Evaluation Patient Details Name: Daisy FolksRebecca E Pham MRN: [id number] DOB: [birth year] Today's Date: 12/06/2015   History of Present Illness  Pt is a 65 y.o. female s/p R hip hemiarthroplasty secondary to R displaced femoral neck fracture due to a fall. Pt has a past medical history significant for COPD, chronic respiratory failure on 2 L nasal cannula at baseline, HTN, hyperlipidemia. Pt with history of chronic pain and daily OxyContin and oxycodone use which may have impacted fall.  Clinical Impression  Pt was able to get up and walk a short distance around the room with RW and moderate assistance.  She is extremely motivated to do well enough to avoid SNF placement as she went to one before and did not have a good experience.  In her currently level of mobility, she is appropriate for SNF level rehab and PT will follow acutely and update recommendations as she progresses.      Follow Up Recommendations SNF    Equipment Recommendations  Rolling walker with 5" wheels    Recommendations for Other Services   NA     Precautions / Restrictions Precautions Precautions: Posterior Hip Precaution Booklet Issued: Yes (comment) Precaution Comments: precaution handout given and reviewed Restrictions Weight Bearing Restrictions: Yes RLE Weight Bearing: Weight bearing as tolerated      Mobility  Bed Mobility Overal bed mobility: Needs Assistance Bed Mobility: Sit to Supine       Sit to supine: Min assist   General bed mobility comments: Min assist to help lift legs back into bed.  Pt able to control trunk, slow speed due to pain.   Transfers Overall transfer level: Needs assistance Equipment used: Rolling walker (2 wheeled) Transfers: Sit to/from Stand Sit to Stand: Mod assist         General transfer comment: mod assist from low recliner chair, verbal cues for safe hand placement, extra time needed due to painful transitions  Ambulation/Gait Ambulation/Gait  assistance: Mod assist Ambulation Distance (Feet): 10 Feet Assistive device: Rolling walker (2 wheeled) Gait Pattern/deviations: Step-to pattern;Antalgic Gait velocity: decreased   General Gait Details: PT with moderately antalgic gait pattern, assist needed at trunk for balance, verbal cues for correct LE sequencing             Balance Overall balance assessment: Needs assistance Sitting-balance support: Feet supported;Bilateral upper extremity supported Sitting balance-Leahy Scale: Fair     Standing balance support: Bilateral upper extremity supported Standing balance-Leahy Scale: Poor                               Pertinent Vitals/Pain Pain Assessment: 0-10 Pain Score: 7  Pain Location: right hip Pain Descriptors / Indicators: Aching;Burning Pain Intervention(s): Limited activity within patient's tolerance;Monitored during session;Repositioned    Home Living Family/patient expects to be discharged to:: Private residence Living Arrangements: Spouse/significant other Available Help at Discharge: Family;Available 24 hours/day (husband works, but could take time off) Type of Home: House Home Access: Stairs to enter Entrance Stairs-Rails: None Entrance Stairs-Number of Steps: 2 Home Layout: One level;Able to live on main level with bedroom/bathroom (has partial second level and basement, but living areas on 1) Home Equipment: Walker - standard;Cane - single point;Other (comment);Hand held shower head;Walker - 4 wheels;Bedside commode      Prior Function Level of Independence: Independent with assistive device(s)         Comments: Using rollator and cane at times for ambulation due to h/o multiple  back surgeries     Hand Dominance   Dominant Hand: Right    Extremity/Trunk Assessment   Upper Extremity Assessment: Defer to OT evaluation           Lower Extremity Assessment: RLE deficits/detail RLE Deficits / Details: right leg with normal  post op pain and weakness.  At least 3/5 ankle DF, 3-/5 knee extension, 2/5 hip    Cervical / Trunk Assessment: Other exceptions  Communication   Communication: No difficulties  Cognition Arousal/Alertness: Awake/alert Behavior During Therapy: WFL for tasks assessed/performed Overall Cognitive Status: Within Functional Limits for tasks assessed                      General Comments General comments (skin integrity, edema, etc.): O2 [place] used throughout mobility.     Exercises Total Joint Exercises Ankle Circles/Pumps: AROM;Both;20 reps   Assessment/Plan    PT Assessment Patient needs continued PT services  PT Problem List Decreased strength;Decreased range of motion;Decreased activity tolerance;Decreased balance;Decreased mobility;Decreased knowledge of use of DME;Decreased knowledge of precautions;Pain          PT Treatment Interventions DME instruction;Gait training;Stair training;Functional mobility training;Therapeutic exercise;Therapeutic activities;Balance training;Patient/family education;Manual techniques;Modalities    PT Goals (Current goals can be found in the Care Plan section)  Acute Rehab PT Goals Patient Stated Goal: to get mobile enough to go home instead of SNF rehab.  PT Goal Formulation: With patient Time For Goal Achievement: 12/13/15 Potential to Achieve Goals: Good    Frequency Min 5X/week (would benefit from more)           End of Session Equipment Utilized During Treatment: Gait belt;Oxygen Activity Tolerance: Patient limited by pain Patient left: in bed;with call bell/phone within reach;with family/visitor present           Time: 1610-96041822-1846 PT Time Calculation (min) (ACUTE ONLY): 24 min   Charges:   PT Evaluation $PT Eval Moderate Complexity: 1 Procedure PT Treatments $Therapeutic Activity: 8-22 mins        Evanthia B. Sirr Kabel, PT, DPT [phone]   12/06/2015, 11:22 PM

## 2015-12-06 NOTE — Progress Notes (Signed)
PT Cancellation Note  Patient Details Name: Daisy FolksRebecca E Schwarz MRN: [id number] DOB: Nov 12, 1950   Cancelled Treatment:    Reason Eval/Treat Not Completed: Pain limiting ability to participate.  Pt is in pain, called RN to request pain meds.  Pt was able to work with OT earlier today.  PT will attempt to check back later as time allows.   Thanks,    Rollene Rotundaebecca B. Ladean Steinmeyer, PT, DPT [id number]#[phone]   12/06/2015, 4:41 PM

## 2015-12-06 NOTE — Progress Notes (Addendum)
PROGRESS NOTE    [name]  ZOX:[id number] DOB: Dec 26, 1950 DOA: 12/05/2015 PCP: Delorse Lek, MD   Brief Narrative: [name] is a 65 y.o. female with medical history significant COPD, chronic respiratory failure on 2 L nasal cannula at baseline, smoker chronic back pain secondary to multiple lumbar surgeries in the past, hypertension, hyperlipidemia presents to the emergency department chief complaint of right hip pain after a fall. Initial evaluation reveals right hip fracture.  Information is obtained from the husband who is at the bedside. He states he was asleep and heard patient call out to him. She was in the kitchen and had fallen. He reports her lower extremities have decreased sensation and she "frequently falls". Of note she has a history of acute encephalopathy secondary to aspiration pneumonia in the setting of excessive lethargy due to narcotics x2.  Husband does report patient takes large amount of narcotics daily and that sometimes it "gets ahead of her" and this may be contributing to a fall. He states recently the dosages have been reduced by PCP. He reports OxyContin reduced from 20 mg every 12 hours to 15 and OxyIR reduced from 10 mg to 5 mg every 8 hours   Assessment & Plan:   Principal Problem:   Hip fx, right, closed, initial encounter [facility]) Active Problems:   Essential hypertension   COPD GOLD III/ still smoking    Chronic respiratory failure with hypercapnia (HCC)   Cigarette smoker   Hyponatremia   Chronic pain  1. Right hip fracture presumably from mechanical fall in the setting of heavy narcotic use. X-ray shows displaced acute right femoral neck fracture. Orthopedics on board Right hip hemiarthroplasty 10-29 PT per ortho.   2. COPD Gold III. patient on home oxygen at baseline. -Chronic respiratory failure with hypercapnia. Oxygen supplementation -Continue inhalers and nebulizers -monitor oxygen saturation level -Added schedule nebulizer.    -continue with dulera.   3. Hypertension. Fair control in the emergency department. Home medications include clonidine -continue clonidine -monitor  4. Hyponatremia. Sodium level 127. Chart review indicates some chronic hyponatremia.  -improved with IV fluids.   5. Tobacco use. Cessation counseling offered  6. Chronic pain. Home medications include oxycodone 5 mg every 8 hours as needed as well as OxyContin 15 mg every 12 hours. Patient appears quite lethargic on admission. Has a history of acute encephalopathy secondary to overmedication. Experienced withdrawals during recent hospitalization on the ventilator -We'll continue her home regimen at lower dosage -Monitor pain closely. Oversedation and/or withdrawal. -patient to be discharge to SNF, she will need supervised environment. Goal should be to titrate her  off chronic narcotics over time.  -long discussion with husband, he will need to be on charge of her pain medication at discharge from SNF>   7-Acute blood loss anemia; post op, expected. Follow hb trend.  Hb on admission 13---9.9 Repeat labs in am.     DVT prophylaxis; Lovenox.  Code Status: Full code.  Family Communication: Care discussed with patient.  Disposition Plan: remain in patient  Consultants:   Dr Dion Saucier.   Procedures: Right hip hemiarthroplasty 10-29    Antimicrobials:  Cefazolin pre op.    Subjective: She is sitting on the recliner. She is able to answer some questions. Complaining of hip pain.  She doesn't remember events from yesterday, how she fell.    Objective: Vitals:   12/05/15 2039 12/05/15 2105 12/06/15 0000 12/06/15 0527  BP: (!) 141/64 130/62 128/67 (!) 154/62  Pulse: 81 79 (!)  102 92  Resp: 13 16 16 16   Temp: 97.7 F (36.5 C) 99.1 F (37.3 C) 99 F (37.2 C) 99.6 F (37.6 C)  TempSrc:  Oral Oral Oral  SpO2: 92% 91% 96% 92%    Intake/Output Summary (Last 24 hours) at 12/06/15 0842 Last data filed at 12/06/15 [id number]   Gross per 24 hour  Intake           2282.5 ml  Output             3975 ml  Net          -1692.5 ml   There were no vitals filed for this visit.  Examination:  General exam: Appears calm and comfortable  Respiratory system: Clear to auscultation. Respiratory effort normal. Cardiovascular system: S1 & S2 heard, RRR. No JVD, murmurs, rubs, gallops or clicks. No pedal edema. Gastrointestinal system: Abdomen is nondistended, soft and nontender. No organomegaly or masses felt. Normal bowel sounds heard. Central nervous system: Alert and oriented. No focal neurological deficits. Extremities: Symmetric 5 x 5 power. Right hip with clean dressing,.  Skin: No rashes, lesions or ulcers    Data Reviewed: I have personally reviewed following labs and imaging studies  CBC:  Recent Labs Lab 12/05/15 0925 12/06/15 0404  WBC 12.8* 10.6*  NEUTROABS 9.9*  --   HGB 13.0 9.9*  HCT 38.1 29.6*  MCV 88.0 88.6  PLT 222 174   Basic Metabolic Panel:  Recent Labs Lab 12/05/15 0925 12/06/15 0404  NA 127* 130*  K 4.5 4.3  CL 93* 97*  CO2 27 27  GLUCOSE 87 102*  BUN 10 7  CREATININE 0.50 0.48  CALCIUM 9.1 8.3*   GFR: CrCl cannot be calculated (Unknown ideal weight.). Liver Function Tests: No results for input(s): AST, ALT, ALKPHOS, BILITOT, PROT, ALBUMIN in the last 168 hours. No results for input(s): LIPASE, AMYLASE in the last 168 hours. No results for input(s): AMMONIA in the last 168 hours. Coagulation Profile:  Recent Labs Lab 12/05/15 0925  INR 0.99   Cardiac Enzymes: No results for input(s): CKTOTAL, CKMB, CKMBINDEX, TROPONINI in the last 168 hours. BNP (last 3 results) No results for input(s): PROBNP in the last 8760 hours. HbA1C: No results for input(s): HGBA1C in the last 72 hours. CBG: No results for input(s): GLUCAP in the last 168 hours. Lipid Profile: No results for input(s): CHOL, HDL, LDLCALC, TRIG, CHOLHDL, LDLDIRECT in the last 72 hours. Thyroid Function  Tests: No results for input(s): TSH, T4TOTAL, FREET4, T3FREE, THYROIDAB in the last 72 hours. Anemia Panel: No results for input(s): VITAMINB12, FOLATE, FERRITIN, TIBC, IRON, RETICCTPCT in the last 72 hours. Sepsis Labs: No results for input(s): PROCALCITON, LATICACIDVEN in the last 168 hours.  Recent Results (from the past 240 hour(s))  Surgical pcr screen     Status: Abnormal   Collection Time: 12/05/15 12:38 PM  Result Value Ref Range Status   MRSA, PCR NEGATIVE NEGATIVE Final   Staphylococcus aureus POSITIVE (A) NEGATIVE Final    Comment:        The Xpert SA Assay (FDA approved for NASAL specimens in patients over 67 years of age), is one component of a comprehensive surveillance program.  Test performance has been validated by [facility] for patients greater than or equal to 67 year old. It is not intended to diagnose infection nor to guide or monitor treatment.          Radiology Studies: Dg Pelvis 1-2 Views  Result Date: 12/05/2015 CLINICAL  DATA:  Severe right hip pain following a fall today. EXAM: PELVIS - 1-2 VIEW COMPARISON:  Pelvis CT dated 07/04/2013. FINDINGS: Again demonstrated is lower lumbar spine and right pelvic fixation hardware. An interval right femoral neck fracture is demonstrated with varus angulation and mild proximal displacement of the distal fragment. Diffuse osteopenia is noted. Prominent stool in the colon. IMPRESSION: Acute right femoral neck fracture. Electronically Signed   By: Beckie SaltsSteven  Reid M.D.   On: 12/05/2015 10:11   Chest Portable 1 View  Result Date: 12/05/2015 CLINICAL DATA:  Hip fracture EXAM: PORTABLE CHEST 1 VIEW COMPARISON:  None. FINDINGS: Chronic interstitial markings. No focal consolidation. No pleural effusion or pneumothorax. The heart is top-normal in size. Thoracolumbar spinal rods. IMPRESSION: No evidence of acute cardiopulmonary disease. Electronically Signed   By: Charline BillsSriyesh  Krishnan M.D.   On: 12/05/2015 12:29   Dg Hip  Port Unilat With Pelvis 1v Right  Result Date: 12/05/2015 CLINICAL DATA:  Status post internal fixation of right hip fracture. Initial encounter. EXAM: DG HIP (WITH OR WITHOUT PELVIS) 1V PORT RIGHT COMPARISON:  None. FINDINGS: The patient's right hip arthroplasty appears grossly intact, without evidence of loosening or new fracture. The left hip joint is unremarkable in appearance. Lumbosacral spinal fusion hardware appears to demonstrate a chronic fracture on the right side, at L4-L5, unchanged from 2015. There is fusion of the right sacroiliac joint. Postoperative soft tissue air is seen overlying the right hip. IMPRESSION: Status post right hip arthroplasty. Prosthesis appears grossly intact, without evidence of loosening or new fracture. Electronically Signed   By: Roanna RaiderJeffery  Chang M.D.   On: 12/05/2015 19:45   Dg Femur, Min 2 Views Right  Result Date: 12/05/2015 CLINICAL DATA:  Fall EXAM: RIGHT FEMUR 2 VIEWS COMPARISON:  None. FINDINGS: There is a displaced fracture of the right femoral neck. Fracture occurs in the mid neck region. There is some rotation at the fracture site. IMPRESSION: Displaced acute right femoral neck fracture. Electronically Signed   By: Jolaine ClickArthur  Hoss M.D.   On: 12/05/2015 10:09        Scheduled Meds: . amLODipine  10 mg Oral QHS  . calcium-vitamin D  1 tablet Oral QHS  . citalopram  20 mg Oral BH-q7a  . cloNIDine  0.1 mg Oral BID  . docusate sodium  100 mg Oral BID  . enoxaparin (LOVENOX) injection  40 mg Subcutaneous Q24H  . famotidine  10 mg Oral QHS  . ferrous sulfate  325 mg Oral TID PC  . gabapentin  300 mg Oral q morning - 10a  . gabapentin  600 mg Oral QHS  . HYDROmorphone      . ipratropium-albuterol  3 mL Nebulization Once  . magnesium oxide  200 mg Oral QHS   And  . zinc sulfate  220 mg Oral QHS  . mometasone-formoterol  2 puff Inhalation BID  . mupirocin ointment  1 application Nasal BID  . nicotine  21 mg Transdermal Daily  . omega-3 acid ethyl  esters  1 g Oral Daily  . oxyCODONE  10 mg Oral Q12H  . pravastatin  40 mg Oral QHS  . senna  1 tablet Oral BID   Continuous Infusions: . sodium chloride 75 mL/hr (12/05/15 2133)     LOS: 1 day    Time spent: 35 minutes.     Alba Coryegalado, Tamecia Mcdougald A, MD [facility] Pager [phone]  If 7PM-7AM, please contact night-coverage [email or web address] Password [email or web address] 12/06/2015, 8:42 AM

## 2015-12-06 NOTE — Progress Notes (Signed)
Patient ID: Daisy FolksRebecca E Nudelman, female   DOB: 09-02-1950, 65 y.o.   MRN: [id number]     Subjective:  Patient reports pain as mild to moderate.  Patient in bed and in no acute distress denies any CP or SOB  Objective:   VITALS:   Vitals:   12/05/15 2105 12/06/15 0000 12/06/15 0527 12/06/15 1500  BP: 130/62 128/67 (!) 154/62 (!) 138/59  Pulse: 79 (!) 102 92 (!) 107  Resp: 16 16 16 17   Temp: 99.1 F (37.3 C) 99 F (37.2 C) 99.6 F (37.6 C) 98.9 F (37.2 C)  TempSrc: Oral Oral Oral Oral  SpO2: 91% 96% 92% 95%    ABD soft Sensation intact distally Dorsiflexion/Plantar flexion intact Incision: dressing C/D/I and scant drainage   Lab Results  Component Value Date   WBC 10.6 (H) 12/06/2015   HGB 9.9 (L) 12/06/2015   HCT 29.6 (L) 12/06/2015   MCV 88.6 12/06/2015   PLT 174 12/06/2015   BMET    Component Value Date/Time   NA 130 (L) 12/06/2015 0404   K 4.3 12/06/2015 0404   CL 97 (L) 12/06/2015 0404   CO2 27 12/06/2015 0404   GLUCOSE 102 (H) 12/06/2015 0404   BUN 7 12/06/2015 0404   CREATININE 0.48 12/06/2015 0404   CALCIUM 8.3 (L) 12/06/2015 0404   GFRNONAA >60 12/06/2015 0404   GFRAA >60 12/06/2015 0404     Assessment/Plan: 1 Day Post-Op   Principal Problem:   Hip fx, right, closed, initial encounter (HCC) Active Problems:   Essential hypertension   COPD GOLD III/ still smoking    Chronic respiratory failure with hypercapnia (HCC)   Cigarette smoker   Hyponatremia   Chronic pain   Advance diet Up with therapy WBAT Dry dressing PRN Continue plan per medicine   Haskel KhanDOUGLAS Castin Donaghue 12/06/2015, 4:24 PM   Teryl LucyJoshua Landau, MD Cell [phone]

## 2015-12-07 LAB — CBC
HCT: 24 % — ABNORMAL LOW (ref 36.0–46.0)
Hemoglobin: 8.2 g/dL — ABNORMAL LOW (ref 12.0–15.0)
MCH: 30.3 pg (ref 26.0–34.0)
MCHC: 34.2 g/dL (ref 30.0–36.0)
MCV: 88.6 fL (ref 78.0–100.0)
PLATELETS: 140 10*3/uL — AB (ref 150–400)
RBC: 2.71 MIL/uL — ABNORMAL LOW (ref 3.87–5.11)
RDW: 13 % (ref 11.5–15.5)
WBC: 8.4 10*3/uL (ref 4.0–10.5)

## 2015-12-07 LAB — BASIC METABOLIC PANEL
ANION GAP: 4 — AB (ref 5–15)
BUN: 5 mg/dL — AB (ref 6–20)
CALCIUM: 8.3 mg/dL — AB (ref 8.9–10.3)
CO2: 30 mmol/L (ref 22–32)
CREATININE: 0.38 mg/dL — AB (ref 0.44–1.00)
Chloride: 101 mmol/L (ref 101–111)
GFR calc Af Amer: >60 mL/min (ref >60)
GLUCOSE: 95 mg/dL (ref 65–99)
Potassium: 4.2 mmol/L (ref 3.5–5.1)
Sodium: 135 mmol/L (ref 135–145)

## 2015-12-07 MED ORDER — IPRATROPIUM-ALBUTEROL 0.5-2.5 (3) MG/3ML IN SOLN
3.0000 mL | Freq: Three times a day (TID) | RESPIRATORY_TRACT | Status: DC
  Administered 2015-12-07 – 2015-12-08 (×5): 3 mL via RESPIRATORY_TRACT
  Filled 2015-12-07 (×6): qty 3

## 2015-12-07 MED ORDER — CIPROFLOXACIN HCL 0.3 % OP SOLN
1.0000 [drp] | OPHTHALMIC | Status: DC
  Administered 2015-12-07 – 2015-12-08 (×6): 1 [drp] via OTIC
  Filled 2015-12-07: qty 2.5

## 2015-12-07 MED ORDER — POLYETHYLENE GLYCOL 3350 17 G PO PACK
17.0000 g | PACK | Freq: Two times a day (BID) | ORAL | Status: DC
  Administered 2015-12-07 – 2015-12-08 (×2): 17 g via ORAL
  Filled 2015-12-07 (×2): qty 1

## 2015-12-07 MED ORDER — ZOLPIDEM TARTRATE 5 MG PO TABS
5.0000 mg | ORAL_TABLET | Freq: Every evening | ORAL | Status: DC | PRN
  Administered 2015-12-07: 5 mg via ORAL
  Filled 2015-12-07: qty 1

## 2015-12-07 NOTE — Progress Notes (Signed)
Physical Therapy Treatment Patient Details Name: Daisy FolksRebecca E Pham MRN: [id number] DOB: [birth year] Today's Date: 12/07/2015    History of Present Illness Pt is a 65 y.o. female s/p R hip hemiarthroplasty secondary to R displaced femoral neck fracture due to a fall. Pt has a past medical history significant for COPD, chronic respiratory failure on 2 L nasal cannula at baseline, HTN, hyperlipidemia. Pt with history of chronic pain and daily OxyContin and oxycodone use which may have impacted fall.    PT Comments    Pt presents with improved demeanor and ability to participate in today's session. Increased gait distance x 30' this visit from 10' this AM and is able to maneuver RLE with decreased assistance from therapist. Pt has decreased pain following therapy session. Possible DC to SNF tomorrow.    Follow Up Recommendations  SNF     Equipment Recommendations  Rolling walker with 5" wheels    Recommendations for Other Services       Precautions / Restrictions Precautions Precautions: Posterior Hip Precaution Booklet Issued: Yes (comment) Precaution Comments: reviewed precautions handout, client unable to recall this visit Restrictions Weight Bearing Restrictions: Yes RLE Weight Bearing: Weight bearing as tolerated Other Position/Activity Restrictions: posterior hip precautions    Mobility  Bed Mobility Overal bed mobility: Needs Assistance Bed Mobility: Supine to Sit;Sit to Supine     Supine to sit: Min assist Sit to supine: Min assist   General bed mobility comments: Min A to move RLE to EOB in order to stay within precaitions  Transfers Overall transfer level: Needs assistance Equipment used: Rolling walker (2 wheeled) Transfers: Sit to/from Stand Sit to Stand: Min assist         General transfer comment: Min A from EOB with verbal cues for proper hand placement  Ambulation/Gait Ambulation/Gait assistance: Min assist Ambulation Distance (Feet): 30  Feet Assistive device: Rolling walker (2 wheeled) Gait Pattern/deviations: Step-to pattern;Decreased stance time - right Gait velocity: decreased Gait velocity interpretation: <1.8 ft/sec, indicative of risk for recurrent falls General Gait Details: Pt with moderate antalgic gait pattern requiring assistance at trunk for balance. requires cues for LE sequencing   Stairs            Wheelchair Mobility    Modified Rankin (Stroke Patients Only)       Balance Overall balance assessment: Needs assistance Sitting-balance support: Feet supported Sitting balance-Leahy Scale: Fair     Standing balance support: Bilateral upper extremity supported Standing balance-Leahy Scale: Poor Standing balance comment: Requires UE use for support                    Cognition Arousal/Alertness: Awake/alert Behavior During Therapy: WFL for tasks assessed/performed Overall Cognitive Status: Within Functional Limits for tasks assessed                      Exercises Total Joint Exercises Short Arc QuadBarbaraann Boys: AAROM;Right;10 reps;Supine Hip ABduction/ADduction: AAROM;Right;10 reps;Supine    General Comments        Pertinent Vitals/Pain Pain Assessment: 0-10 Pain Score: 4  Pain Location: Right Hip Pain Descriptors / Indicators: Aching;Burning Pain Intervention(s): Limited activity within patient's tolerance;Monitored during session    Home Living                      Prior Function            PT Goals (current goals can now be found in the care plan section) Acute Rehab PT Goals  Patient Stated Goal: to get mobile enough to go home instead of SNF rehab.  Progress towards PT goals: Progressing toward goals    Frequency    Min 5X/week      PT Plan      Co-evaluation             End of Session Equipment Utilized During Treatment: Gait belt Activity Tolerance: Patient tolerated treatment well Patient left: in bed;with call bell/phone within  reach;with family/visitor present     Time: [id number] PT Time Calculation (min) (ACUTE ONLY): 32 min  Charges:  $Gait Training: 8-22 mins $Therapeutic Exercise: 8-22 mins                    G Codes:      Colin BroachSabra M. Darious Rehman PT, DPT  [phone]  12/07/2015, 3:56 PM

## 2015-12-07 NOTE — Clinical Social Work Note (Signed)
Clinical Social Work Assessment  Patient Details  Name: [name] MRN: [id number] Date of Birth: 08/14/1950  Date of referral:  12/07/15               Reason for consult:  Facility Placement                Permission sought to share information with:  Family Supports, Facility Contact Representative, Case Manager Permission granted to share information::  Yes, Verbal Permission Granted  Name::      ([name])  Agency::   (SNFs in [place] with preference for [facility])  Relationship::   (husband)  Contact Information:     Housing/Transportation Living arrangements for the past 2 months:  Single Family Home Source of Information:  Spouse, Patient Patient Interpreter Needed:  None Criminal Activity/Legal Involvement Pertinent to Current Situation/Hospitalization:  No - Comment as needed Significant Relationships:  Adult Children, Spouse Lives with:  Spouse Do you feel safe going back to the place where you live?  No Need for family participation in patient care:  Yes (Comment) (patient requests both husband and daughter be involved)  Care giving concerns:  Husband states he does not wish for patient to return to [facility].  She did not have a great experience there in the past.   Social Worker assessment / plan:  CSW met with patient and husband to review disposition.  Patient states she wishes for husband to be my point of contact for disposition assistance.  Patient also states her daughter [name] will also be involved in this process.  Patient lives in [place] which is in close proximity to [facility] where they have a church friend receiving STR at as well.  Patient and husband would like this to be explored.  CSW will contact and ask about bed availability for projected dc tomorrow.  Employment status:  Retired Insurance information:  Managed Medicare PT Recommendations:  Skilled Nursing Facility Information / Referral to community resources:   Skilled Nursing Facility  Patient/Family's Response to care:  Agreeable to SNF.  Patient/Family's Understanding of and Emotional Response to Diagnosis, Current Treatment, and Prognosis:  Patient has been to a facility in the past and states she knows what to expect, but hopes to have better care this time.  CSW offered list of facilities for their research.  Patient and spouse were grateful.   Emotional Assessment Appearance:  Appears stated age Attitude/Demeanor/Rapport:    Affect (typically observed):  Accepting Orientation:  Oriented to Self, Oriented to Place, Oriented to  Time, Oriented to Situation Alcohol / Substance use:  Not Applicable Psych involvement (Current and /or in the community):  No (Comment)  Discharge Needs  Concerns to be addressed:  Care Coordination Readmission within the last 30 days:  No Current discharge risk:  None Barriers to Discharge:  Continued Medical Work up   ,  C, LCSW 12/07/2015, 11:28 AM  

## 2015-12-07 NOTE — Progress Notes (Signed)
Patient ID: Daisy FolksRebecca E Dangerfield, female   DOB: Feb 02, 1951, 65 y.o.   MRN: [id number]     Subjective:  Patient reports pain as mild.  Patient in bed and in no acute distress.  Denies any CP or SOB  Objective:   VITALS:   Vitals:   12/06/15 1500 12/06/15 2000 12/06/15 2150 12/07/15 0628  BP: (!) 138/59 (!) 153/65 (!) 153/65 (!) 145/65  Pulse: (!) 107 88  82  Resp: 17 17  17   Temp: 98.9 F (37.2 C) 99.1 F (37.3 C)  99.1 F (37.3 C)  TempSrc: Oral Oral  Oral  SpO2: 95% 97%  97%    ABD soft Sensation intact distally Dorsiflexion/Plantar flexion intact Incision: dressing C/D/I and no drainage Good foot ankle and knee motion  Lab Results  Component Value Date   WBC 8.4 12/07/2015   HGB 8.2 (L) 12/07/2015   HCT 24.0 (L) 12/07/2015   MCV 88.6 12/07/2015   PLT 140 (L) 12/07/2015   BMET    Component Value Date/Time   NA 130 (L) 12/06/2015 0404   K 4.3 12/06/2015 0404   CL 97 (L) 12/06/2015 0404   CO2 27 12/06/2015 0404   GLUCOSE 102 (H) 12/06/2015 0404   BUN 7 12/06/2015 0404   CREATININE 0.48 12/06/2015 0404   CALCIUM 8.3 (L) 12/06/2015 0404   GFRNONAA >60 12/06/2015 0404   GFRAA >60 12/06/2015 0404     Assessment/Plan: 2 Days Post-Op   Principal Problem:   Hip fx, right, closed, initial encounter (HCC) Active Problems:   Essential hypertension   COPD GOLD III/ still smoking    Chronic respiratory failure with hypercapnia (HCC)   Cigarette smoker   Hyponatremia   Chronic pain   Advance diet Up with therapy WBAT Dry dressing PRN Continue Plan per medicine   Haskel KhanDOUGLAS Marcile Fuquay 12/07/2015, 7:06 AM   Teryl LucyJoshua Landau, MD Cell [phone]

## 2015-12-07 NOTE — Progress Notes (Signed)
PROGRESS NOTE    [name]  YQM:[id number] DOB: Feb 24, 1950 DOA: 12/05/2015 PCP: Delorse Lek, MD   Brief Narrative: [name] is a 65 y.o. female with medical history significant COPD, chronic respiratory failure on 2 L nasal cannula at baseline, smoker chronic back pain secondary to multiple lumbar surgeries in the past, hypertension, hyperlipidemia presents to the emergency department chief complaint of right hip pain after a fall. Initial evaluation reveals right hip fracture.  Information is obtained from the husband who is at the bedside. He states he was asleep and heard patient call out to him. She was in the kitchen and had fallen. He reports her lower extremities have decreased sensation and she "frequently falls". Of note she has a history of acute encephalopathy secondary to aspiration pneumonia in the setting of excessive lethargy due to narcotics x2.  Husband does report patient takes large amount of narcotics daily and that sometimes it "gets ahead of her" and this may be contributing to a fall. He states recently the dosages have been reduced by PCP. He reports OxyContin reduced from 20 mg every 12 hours to 15 and OxyIR reduced from 10 mg to 5 mg every 8 hours   Assessment & Plan:   Principal Problem:   Hip fx, right, closed, initial encounter [facility]) Active Problems:   Essential hypertension   COPD GOLD III/ still smoking    Chronic respiratory failure with hypercapnia (HCC)   Cigarette smoker   Hyponatremia   Chronic pain  1. Right hip fracture presumably from mechanical fall in the setting of  narcotic use. X-ray shows displaced acute right femoral neck fracture. Orthopedics on board Right hip hemiarthroplasty 10-29 PT per ortho.  Hb trending down. Repeat labs in am.   2. COPD Gold III. patient on home oxygen at baseline. -Chronic respiratory failure with hypercapnia. Oxygen supplementation -Continue inhalers and nebulizers -monitor oxygen saturation  level -continue with  nebulizer.  -continue with dulera.   3. Hypertension. Fair control in the emergency department. Home medications include clonidine -continue clonidine -monitor  4. Hyponatremia. Sodium level 127. Chart review indicates some chronic hyponatremia.  -resolved  with IV fluids.   5. Tobacco use. Cessation counseling offered  6. Chronic pain. Home medications include oxycodone 5 mg every 8 hours as needed as well as OxyContin 15 mg every 12 hours. Patient was very  lethargic on admission. Has a history of acute encephalopathy secondary to overmedication. Experienced withdrawals during recent hospitalization on the ventilator. History of overdose on medications.  -We'll continue her home regimen at lower dosage -Monitor pain closely. Oversedation and/or withdrawal. -patient to be discharge to SNF, she will need supervised environment. Goal should be to titrate her  off chronic narcotics over time.  -long discussion with husband, he will need to be on charge of her pain medication at discharge from SNF> also recommend for patient to follow with her pain clinic specialist while she is at [facility] for the goal of titration off opioids.   7-Acute blood loss anemia; post op, expected. Follow hb trend.  Hb on admission 13---9.9--8.2 Repeat labs in am.  On ferrous sulfate.   8-Left otitis; cipr otic.   DVT prophylaxis; Lovenox.  Code Status: Full code.  Family Communication: Care discussed with patient , husband and son at bedside.  Disposition Plan: remain in patient. Repeat hb tomorrow. Might be able to be discharge to SNF 11-01 if hb stable.     Consultants:   Dr Dion Saucier.   Procedures:  Right hip hemiarthroplasty 10-29    Antimicrobials:  Cefazolin pre op.    Subjective: Lying down in bed. She has not had BM yet. She is complaining of left ear pain.  She is also complaining right hip pain.   Objective: Vitals:   12/07/15 0628 12/07/15 0824 12/07/15 0906  12/07/15 0910  BP: (!) 145/65 (!) 135/57    Pulse: 82 88    Resp: 17 18    Temp: 99.1 F (37.3 C) 98.4 F (36.9 C)    TempSrc: Oral Oral    SpO2: 97% 92% 94% 95%    Intake/Output Summary (Last 24 hours) at 12/07/15 1325 Last data filed at 12/07/15 1219  Gross per 24 hour  Intake             3247 ml  Output                0 ml  Net             3247 ml   There were no vitals filed for this visit.  Examination:  General exam: Appears calm and comfortable  Respiratory system: Clear to auscultation. Respiratory effort normal. Cardiovascular system: S1 & S2 heard, RRR. No JVD, murmurs, rubs, gallops or clicks. No pedal edema. Gastrointestinal system: Abdomen is nondistended, soft and nontender. No organomegaly or masses felt. Normal bowel sounds heard. Central nervous system: Alert and oriented. No focal neurological deficits. Extremities: Symmetric 5 x 5 power. Right hip with clean dressing,.  Skin: No rashes, lesions or ulcers    Data Reviewed: I have personally reviewed following labs and imaging studies  CBC:  Recent Labs Lab 12/05/15 0925 12/06/15 0404 12/07/15 0617  WBC 12.8* 10.6* 8.4  NEUTROABS 9.9*  --   --   HGB 13.0 9.9* 8.2*  HCT 38.1 29.6* 24.0*  MCV 88.0 88.6 88.6  PLT 222 174 140*   Basic Metabolic Panel:  Recent Labs Lab 12/05/15 0925 12/06/15 0404 12/07/15 0617  NA 127* 130* 135  K 4.5 4.3 4.2  CL 93* 97* 101  CO2 27 27 30   GLUCOSE 87 102* 95  BUN 10 7 5*  CREATININE 0.50 0.48 0.38*  CALCIUM 9.1 8.3* 8.3*   GFR: CrCl cannot be calculated (Unknown ideal weight.). Liver Function Tests: No results for input(s): AST, ALT, ALKPHOS, BILITOT, PROT, ALBUMIN in the last 168 hours. No results for input(s): LIPASE, AMYLASE in the last 168 hours. No results for input(s): AMMONIA in the last 168 hours. Coagulation Profile:  Recent Labs Lab 12/05/15 0925  INR 0.99   Cardiac Enzymes: No results for input(s): CKTOTAL, CKMB, CKMBINDEX, TROPONINI  in the last 168 hours. BNP (last 3 results) No results for input(s): PROBNP in the last 8760 hours. HbA1C: No results for input(s): HGBA1C in the last 72 hours. CBG: No results for input(s): GLUCAP in the last 168 hours. Lipid Profile: No results for input(s): CHOL, HDL, LDLCALC, TRIG, CHOLHDL, LDLDIRECT in the last 72 hours. Thyroid Function Tests: No results for input(s): TSH, T4TOTAL, FREET4, T3FREE, THYROIDAB in the last 72 hours. Anemia Panel: No results for input(s): VITAMINB12, FOLATE, FERRITIN, TIBC, IRON, RETICCTPCT in the last 72 hours. Sepsis Labs: No results for input(s): PROCALCITON, LATICACIDVEN in the last 168 hours.  Recent Results (from the past 240 hour(s))  Surgical pcr screen     Status: Abnormal   Collection Time: 12/05/15 12:38 PM  Result Value Ref Range Status   MRSA, PCR NEGATIVE NEGATIVE Final   Staphylococcus aureus POSITIVE (A)  NEGATIVE Final    Comment:        The Xpert SA Assay (FDA approved for NASAL specimens in patients over 67 years of age), is one component of a comprehensive surveillance program.  Test performance has been validated by [facility] for patients greater than or equal to 67 year old. It is not intended to diagnose infection nor to guide or monitor treatment.          Radiology Studies: Dg Hip Port Unilat With Pelvis 1v Right  Result Date: 12/05/2015 CLINICAL DATA:  Status post internal fixation of right hip fracture. Initial encounter. EXAM: DG HIP (WITH OR WITHOUT PELVIS) 1V PORT RIGHT COMPARISON:  None. FINDINGS: The patient's right hip arthroplasty appears grossly intact, without evidence of loosening or new fracture. The left hip joint is unremarkable in appearance. Lumbosacral spinal fusion hardware appears to demonstrate a chronic fracture on the right side, at L4-L5, unchanged from 2015. There is fusion of the right sacroiliac joint. Postoperative soft tissue air is seen overlying the right hip. IMPRESSION: Status  post right hip arthroplasty. Prosthesis appears grossly intact, without evidence of loosening or new fracture. Electronically Signed   By: Roanna RaiderJeffery  Chang M.D.   On: 12/05/2015 19:45        Scheduled Meds: . amLODipine  10 mg Oral QHS  . calcium-vitamin D  1 tablet Oral QHS  . ciprofloxacin  1 drop Left Ear Q4H while awake  . citalopram  20 mg Oral BH-q7a  . cloNIDine  0.1 mg Oral BID  . docusate sodium  100 mg Oral BID  . enoxaparin (LOVENOX) injection  40 mg Subcutaneous Q24H  . famotidine  10 mg Oral QHS  . ferrous sulfate  325 mg Oral TID PC  . gabapentin  300 mg Oral q morning - 10a  . gabapentin  600 mg Oral QHS  . ipratropium-albuterol  3 mL Nebulization Once  . ipratropium-albuterol  3 mL Nebulization TID  . magnesium oxide  200 mg Oral QHS   And  . zinc sulfate  220 mg Oral QHS  . mometasone-formoterol  2 puff Inhalation BID  . mupirocin ointment  1 application Nasal BID  . nicotine  21 mg Transdermal Daily  . omega-3 acid ethyl esters  1 g Oral Daily  . oxyCODONE  10 mg Oral Q12H  . polyethylene glycol  17 g Oral Daily  . pravastatin  40 mg Oral QHS  . senna  1 tablet Oral BID   Continuous Infusions:     LOS: 2 days    Time spent: 35 minutes.     Alba Coryegalado, Cannon Arreola A, MD [facility] Pager [phone]  If 7PM-7AM, please contact night-coverage [email or web address] Password [email or web address] 12/07/2015, 1:25 PM

## 2015-12-07 NOTE — Clinical Social Work Placement (Signed)
   CLINICAL SOCIAL WORK PLACEMENT  NOTE  Date:  12/07/2015  Patient Details  Name: [name] MRN: [id number] Date of Birth: 1950/06/19  Clinical Social Work is seeking post-discharge placement for this patient at the Skilled  Nursing Facility level of care (*CSW will initial, date and re-position this form in  chart as items are completed):  Yes   Patient/family provided with [place] Clinical Social Work Department's list of facilities offering this level of care within the geographic area requested by the patient (or if unable, by the patient's family).  Yes   Patient/family informed of their freedom to choose among providers that offer the needed level of care, that participate in Medicare, Medicaid or managed care program needed by the patient, have an available bed and are willing to accept the patient.  Yes   Patient/family informed of [place]'s ownership interest in [facility] and [facility], as well as of the fact that they are under no obligation to receive care at these facilities.  PASRR submitted to EDS on       PASRR number received on       Existing PASRR number confirmed on 12/07/15     FL2 transmitted to all facilities in geographic area requested by pt/family on 12/07/15     FL2 transmitted to all facilities within larger geographic area on 12/07/15     Patient informed that his/her managed care company has contracts with or will negotiate with certain facilities, including the following:        Yes   Patient/family informed of bed offers received.  Patient chooses bed at       Physician recommends and patient chooses bed at      Patient to be transferred to   on  .  Patient to be transferred to facility by       Patient family notified on   of transfer.  Name of family member notified:        PHYSICIAN       Additional Comment:    _______________________________________________ Rondel BatonIngle, Lerae Langham C, LCSW 12/07/2015, 11:30 AM

## 2015-12-07 NOTE — Progress Notes (Signed)
Physical Therapy Treatment Patient Details Name: Daisy FolksRebecca E Kamphaus MRN: [id number] DOB: [birth year] Today's Date: 12/07/2015    History of Present Illness Pt is a 65 y.o. female s/p R hip hemiarthroplasty secondary to R displaced femoral neck fracture due to a fall. Pt has a past medical history significant for COPD, chronic respiratory failure on 2 L nasal cannula at baseline, HTN, hyperlipidemia. Pt with history of chronic pain and daily OxyContin and oxycodone use which may have impacted fall.    PT Comments    Pt presents with increased c/o right hip pain prior to this visit at 5/10 and this decreases to 2/10 by the end of treatment session. Performed long sitting exercises and advised client to perform at least 3x daily. Pt requires decreased assistance for sit to stand from EOB this visit but continues to require cues for safety in order to maintain posterior precautions.   Follow Up Recommendations  SNF     Equipment Recommendations  Rolling walker with 5" wheels    Recommendations for Other Services       Precautions / Restrictions Precautions Precautions: Posterior Hip Precaution Booklet Issued: Yes (comment) Precaution Comments: reviewed precautions handout, client unable to recall this visit Restrictions Weight Bearing Restrictions: Yes RLE Weight Bearing: Weight bearing as tolerated Other Position/Activity Restrictions: posterior hip precautions    Mobility  Bed Mobility Overal bed mobility: Needs Assistance Bed Mobility: Supine to Sit     Supine to sit: Min assist     General bed mobility comments: Min A to move RLE to EOB in order to stay within precaitions  Transfers Overall transfer level: Needs assistance Equipment used: Rolling walker (2 wheeled) Transfers: Sit to/from Stand Sit to Stand: Min assist         General transfer comment: Min A from EOB with verbal cues for proper hand placement  Ambulation/Gait Ambulation/Gait assistance: Min  assist Ambulation Distance (Feet): 10 Feet Assistive device: Rolling walker (2 wheeled) Gait Pattern/deviations: Step-to pattern;Decreased stance time - right;Decreased step length - left;Antalgic Gait velocity: decreased Gait velocity interpretation: <1.8 ft/sec, indicative of risk for recurrent falls General Gait Details: Pt with moderate antalgic gait pattern requiring assistance at trunk for balance. requires cues for LE sequencing   Stairs            Wheelchair Mobility    Modified Rankin (Stroke Patients Only)       Balance Overall balance assessment: Needs assistance Sitting-balance support: Single extremity supported Sitting balance-Leahy Scale: Fair     Standing balance support: Bilateral upper extremity supported Standing balance-Leahy Scale: Poor Standing balance comment: requires use of RW for stability                    Cognition Arousal/Alertness: Awake/alert Behavior During Therapy: WFL for tasks assessed/performed Overall Cognitive Status: Within Functional Limits for tasks assessed                      Exercises Total Joint Exercises Ankle Circles/Pumps: AROM;Both;20 reps;Supine Quad Sets: AROM;Right;10 reps;Supine Heel Slides: AAROM;Right;10 reps;Supine    General Comments General comments (skin integrity, edema, etc.): O2 use throughout mobility      Pertinent Vitals/Pain Pain Assessment: 0-10 Pain Score: 5  Pain Location: Right Hip Pain Descriptors / Indicators: Aching;Burning Pain Intervention(s): Limited activity within patient's tolerance;Repositioned    Home Living                      Prior Function  PT Goals (current goals can now be found in the care plan section) Acute Rehab PT Goals Patient Stated Goal: to get mobile enough to go home instead of SNF rehab.  Progress towards PT goals: Progressing toward goals    Frequency    Min 5X/week      PT Plan      Co-evaluation              End of Session Equipment Utilized During Treatment: Gait belt;Oxygen Activity Tolerance: Patient limited by pain Patient left: in chair;with call bell/phone within reach;Other (comment) (Resiratory in room )     Time: [id number] PT Time Calculation (min) (ACUTE ONLY): 31 min  Charges:  $Gait Training: 8-22 mins $Therapeutic Activity: 8-22 mins                    G Codes:      Colin BroachSabra M. Gabrielly Mccrystal PT, DPT  [phone]  12/07/2015, 10:03 AM

## 2015-12-07 NOTE — NC FL2 (Signed)
[place] MEDICAID FL2 LEVEL OF CARE SCREENING TOOL     IDENTIFICATION  Patient Name: [name] Birthdate: 1950/03/05 Sex: female Admission Date (Current Location): 12/05/2015  [facility] and [place] Number:  Producer, television/film/video and Address:  [facility], [address], [place], [place] [address]      Provider Number: [id number]  Attending Physician Name and Address:  Alba Cory, MD  Relative Name and Phone Number:       Current Level of Care: Hospital Recommended Level of Care: Skilled Nursing Facility Prior Approval Number:    Date Approved/Denied:   PASRR Number: [id number]  Discharge Plan: SNF    Current Diagnoses: Patient Active Problem List   Diagnosis Date Noted  . Hip fx, right, closed, initial encounter (HCC) 12/05/2015  . Pressure ulcer 07/07/2014  . Altered mental state   . Change in mental state   . Fever   . Acute encephalopathy 07/06/2014  . Narcotic overdose   . Hyponatremia 05/31/2014  . Lethargic 05/31/2014  . Chronic pain 05/31/2014  . Sepsis (HCC) 05/31/2014  . COPD GOLD III/ still smoking  04/02/2014  . Chronic respiratory failure with hypercapnia (HCC) 04/02/2014  . Cigarette smoker 04/02/2014  . COPD exacerbation (HCC) 02/12/2012  . Essential hypertension 02/12/2012  . Tachycardia 02/12/2012    Orientation RESPIRATION BLADDER Height & Weight     Self, Time, Situation, Place  Normal Continent Weight:   Height:     BEHAVIORAL SYMPTOMS/MOOD NEUROLOGICAL BOWEL NUTRITION STATUS      Continent Diet  AMBULATORY STATUS COMMUNICATION OF NEEDS Skin   Limited Assist Verbally Surgical wounds                       Personal Care Assistance Level of Assistance  Dressing, Bathing Bathing Assistance: Limited assistance   Dressing Assistance: Limited assistance     Functional Limitations Info             SPECIAL CARE FACTORS FREQUENCY  PT (By licensed PT), OT (By licensed OT)     PT  Frequency: daily OT Frequency: daily            Contractures Contractures Info: Not present    Additional Factors Info                  Current Medications (12/07/2015):  This is the current hospital active medication list Current Facility-Administered Medications  Medication Dose Route Frequency Provider Last Rate Last Dose  . 0.9 %  sodium chloride infusion  75 mL/hr Intravenous Continuous Teryl Lucy, MD 75 mL/hr at 12/06/15 1352 75 mL/hr at 12/06/15 1352  . acetaminophen (TYLENOL) tablet 650 mg  650 mg Oral Q6H PRN Teryl Lucy, MD       Or  . acetaminophen (TYLENOL) suppository 650 mg  650 mg Rectal Q6H PRN Teryl Lucy, MD      . albuterol (PROVENTIL) (2.5 MG/3ML) 0.083% nebulizer solution 2.5 mg  2.5 mg Nebulization Q4H PRN Gwenyth Bender, NP      . alum & mag hydroxide-simeth (MAALOX/MYLANTA) 200-200-20 MG/5ML suspension 30 mL  30 mL Oral Q4H PRN Teryl Lucy, MD      . amLODipine (NORVASC) tablet 10 mg  10 mg Oral QHS Teryl Lucy, MD   10 mg at 12/06/15 2153  . bisacodyl (DULCOLAX) suppository 10 mg  10 mg Rectal Daily PRN Teryl Lucy, MD      . calcium-vitamin D (OSCAL WITH D) 500-200 MG-UNIT  per tablet 1 tablet  1 tablet Oral QHS Teryl LucyJoshua Landau, MD   1 tablet at 12/06/15 2153  . citalopram (CELEXA) tablet 20 mg  20 mg Oral BH-q7a Lesle ChrisKaren M Black, NP   20 mg at 12/07/15 0825  . cloNIDine (CATAPRES) tablet 0.1 mg  0.1 mg Oral BID Lesle ChrisKaren M Black, NP   0.1 mg at 12/07/15 0830  . docusate sodium (COLACE) capsule 100 mg  100 mg Oral BID Teryl LucyJoshua Landau, MD   100 mg at 12/07/15 0826  . enoxaparin (LOVENOX) injection 40 mg  40 mg Subcutaneous Q24H Teryl LucyJoshua Landau, MD   40 mg at 12/07/15 0825  . famotidine (PEPCID) tablet 10 mg  10 mg Oral QHS Gwenyth BenderKaren M Black, NP   10 mg at 12/06/15 2151  . ferrous sulfate tablet 325 mg  325 mg Oral TID PC Teryl LucyJoshua Landau, MD   325 mg at 12/07/15 0830  . gabapentin (NEURONTIN) capsule 300 mg  300 mg Oral q morning - 10a Haydee SalterPhillip M Hobbs, MD   300 mg  at 12/07/15 0825  . gabapentin (NEURONTIN) capsule 600 mg  600 mg Oral QHS Haydee SalterPhillip M Hobbs, MD   600 mg at 12/06/15 2150  . HYDROcodone-acetaminophen (NORCO/VICODIN) 5-325 MG per tablet 1-2 tablet  1-2 tablet Oral Q6H PRN Teryl LucyJoshua Landau, MD   1 tablet at 12/07/15 1005  . ipratropium-albuterol (DUONEB) 0.5-2.5 (3) MG/3ML nebulizer solution 3 mL  3 mL Nebulization Once Gwenyth BenderKaren M Black, NP      . ipratropium-albuterol (DUONEB) 0.5-2.5 (3) MG/3ML nebulizer solution 3 mL  3 mL Nebulization TID Belkys A Regalado, MD   3 mL at 12/07/15 0906  . ketorolac (TORADOL) 15 MG/ML injection 15 mg  15 mg Intravenous Q6H PRN Belkys A Regalado, MD   15 mg at 12/07/15 0830  . magnesium citrate solution 1 Bottle  1 Bottle Oral Once PRN Teryl LucyJoshua Landau, MD      . magnesium oxide (MAG-OX) tablet 200 mg  200 mg Oral QHS Haydee SalterPhillip M Hobbs, MD   200 mg at 12/06/15 2151   And  . zinc sulfate capsule 220 mg  220 mg Oral QHS Haydee SalterPhillip M Hobbs, MD   220 mg at 12/06/15 2151  . menthol-cetylpyridinium (CEPACOL) lozenge 3 mg  1 lozenge Oral PRN Teryl LucyJoshua Landau, MD       Or  . phenol (CHLORASEPTIC) mouth spray 1 spray  1 spray Mouth/Throat PRN Teryl LucyJoshua Landau, MD      . mometasone-formoterol [facility](DULERA) 200-5 MCG/ACT inhaler 2 puff  2 puff Inhalation BID Gwenyth BenderKaren M Black, NP   2 puff at 12/07/15 0906  . morphine 2 MG/ML injection 2 mg  2 mg Intravenous Q2H PRN Teryl LucyJoshua Landau, MD   2 mg at 12/06/15 [id number]  . mupirocin ointment (BACTROBAN) 2 % 1 application  1 application Nasal BID Haydee SalterPhillip M Hobbs, MD   1 application at 12/07/15 0830  . nicotine (NICODERM CQ - dosed in mg/24 hours) patch 21 mg  21 mg Transdermal Daily Haydee SalterPhillip M Hobbs, MD   21 mg at 12/07/15 0826  . omega-3 acid ethyl esters (LOVAZA) capsule 1 g  1 g Oral Daily Teryl LucyJoshua Landau, MD   1 g at 12/07/15 0830  . ondansetron (ZOFRAN) tablet 4 mg  4 mg Oral Q6H PRN Teryl LucyJoshua Landau, MD       Or  . ondansetron [facility](ZOFRAN) injection 4 mg  4 mg Intravenous Q6H PRN Teryl LucyJoshua Landau, MD      . oxyCODONE (Oxy  IR/ROXICODONE) immediate release  tablet 5 mg  5 mg Oral Q8H PRN Gwenyth BenderKaren M Black, NP   5 mg at 12/07/15 0224  . oxyCODONE (OXYCONTIN) 12 hr tablet 10 mg  10 mg Oral Q12H Gwenyth BenderKaren M Black, NP   10 mg at 12/07/15 0825  . polyethylene glycol (MIRALAX / GLYCOLAX) packet 17 g  17 g Oral Daily Belkys A Regalado, MD   17 g at 12/07/15 0826  . pravastatin (PRAVACHOL) tablet 40 mg  40 mg Oral QHS Gwenyth BenderKaren M Black, NP   40 mg at 12/06/15 2151  . senna (SENOKOT) tablet 8.6 mg  1 tablet Oral BID Teryl LucyJoshua Landau, MD   8.6 mg at 12/07/15 0825  . sodium chloride (OCEAN) 0.65 % nasal spray 1 spray  1 spray Each Nare PRN Gwenyth BenderKaren M Black, NP         Discharge Medications: Please see discharge summary for a list of discharge medications.  Relevant Imaging Results:  Relevant Lab Results:   Additional Information SSN: [id number]  Rondel Batonngle, Rajeev Escue C, LCSW

## 2015-12-08 DIAGNOSIS — Z967 Presence of other bone and tendon implants: Secondary | ICD-10-CM

## 2015-12-08 DIAGNOSIS — I10 Essential (primary) hypertension: Secondary | ICD-10-CM

## 2015-12-08 DIAGNOSIS — D62 Acute posthemorrhagic anemia: Secondary | ICD-10-CM

## 2015-12-08 DIAGNOSIS — S72001A Fracture of unspecified part of neck of right femur, initial encounter for closed fracture: Principal | ICD-10-CM

## 2015-12-08 DIAGNOSIS — Z8781 Personal history of (healed) traumatic fracture: Secondary | ICD-10-CM

## 2015-12-08 DIAGNOSIS — F17210 Nicotine dependence, cigarettes, uncomplicated: Secondary | ICD-10-CM

## 2015-12-08 DIAGNOSIS — J449 Chronic obstructive pulmonary disease, unspecified: Secondary | ICD-10-CM

## 2015-12-08 DIAGNOSIS — G894 Chronic pain syndrome: Secondary | ICD-10-CM

## 2015-12-08 DIAGNOSIS — Z9889 Other specified postprocedural states: Secondary | ICD-10-CM

## 2015-12-08 LAB — URINALYSIS, ROUTINE W REFLEX MICROSCOPIC
Bilirubin Urine: NEGATIVE
Glucose, UA: NEGATIVE mg/dL
Hgb urine dipstick: NEGATIVE
KETONES UR: NEGATIVE mg/dL
LEUKOCYTES UA: NEGATIVE
NITRITE: NEGATIVE
PROTEIN: NEGATIVE mg/dL
Specific Gravity, Urine: 1.012 (ref 1.005–1.030)
pH: 7.5 (ref 5.0–8.0)

## 2015-12-08 LAB — CBC
HCT: 25.8 % — ABNORMAL LOW (ref 36.0–46.0)
Hemoglobin: 8.7 g/dL — ABNORMAL LOW (ref 12.0–15.0)
MCH: 29.8 pg (ref 26.0–34.0)
MCHC: 33.7 g/dL (ref 30.0–36.0)
MCV: 88.4 fL (ref 78.0–100.0)
PLATELETS: 177 10*3/uL (ref 150–400)
RBC: 2.92 MIL/uL — ABNORMAL LOW (ref 3.87–5.11)
RDW: 13.2 % (ref 11.5–15.5)
WBC: 8.5 10*3/uL (ref 4.0–10.5)

## 2015-12-08 LAB — OSMOLALITY, URINE: Osmolality, Ur: 331 mOsm/kg (ref 300–900)

## 2015-12-08 LAB — SODIUM, URINE, RANDOM: SODIUM UR: 46 mmol/L

## 2015-12-08 MED ORDER — BISACODYL 10 MG RE SUPP: 10.0000 mg | Freq: Every day | RECTAL | Status: DC | PRN

## 2015-12-08 MED ORDER — FERROUS SULFATE 325 (65 FE) MG PO TABS: 325.0000 mg | ORAL_TABLET | Freq: Three times a day (TID) | ORAL | Status: AC

## 2015-12-08 MED ORDER — LIP MEDEX EX OINT: 1.0000 "application " | TOPICAL_OINTMENT | CUTANEOUS | Status: DC | PRN

## 2015-12-08 MED ORDER — NICOTINE 21 MG/24HR TD PT24: 21.0000 mg | MEDICATED_PATCH | Freq: Every day | TRANSDERMAL | Status: DC

## 2015-12-08 MED ORDER — OXYCODONE HCL 5 MG PO TABS: 5.0000 mg | ORAL_TABLET | Freq: Four times a day (QID) | ORAL | 0 refills | Status: DC | PRN

## 2015-12-08 MED ORDER — POLYVINYL ALCOHOL 1.4 % OP SOLN: 2.0000 [drp] | OPHTHALMIC | Status: DC | PRN

## 2015-12-08 MED ORDER — OXYCODONE HCL ER 10 MG PO T12A: 10.0000 mg | EXTENDED_RELEASE_TABLET | Freq: Two times a day (BID) | ORAL | 0 refills | Status: AC

## 2015-12-08 MED ORDER — BLISTEX MEDICATED EX OINT: TOPICAL_OINTMENT | CUTANEOUS | Status: DC | PRN

## 2015-12-08 MED ORDER — MUSCLE RUB 10-15 % EX CREA
1.0000 "application " | TOPICAL_CREAM | CUTANEOUS | Status: DC | PRN
  Administered 2015-12-08: 1 via TOPICAL
  Filled 2015-12-08: qty 85

## 2015-12-08 MED ORDER — DOCUSATE SODIUM 100 MG PO CAPS
100.0000 mg | ORAL_CAPSULE | Freq: Two times a day (BID) | ORAL | Status: DC
Start: 2015-12-08 — End: 2016-02-27

## 2015-12-08 MED ORDER — ZOLPIDEM TARTRATE 5 MG PO TABS: 5.0000 mg | ORAL_TABLET | Freq: Every evening | ORAL | 0 refills | Status: DC | PRN

## 2015-12-08 MED ORDER — CIPROFLOXACIN HCL 0.3 % OP SOLN: 1.0000 [drp] | OPHTHALMIC | 0 refills | Status: DC

## 2015-12-08 MED ORDER — GUAIFENESIN-DM 100-10 MG/5ML PO SYRP: 5.0000 mL | ORAL_SOLUTION | ORAL | Status: DC | PRN

## 2015-12-08 NOTE — Progress Notes (Signed)
[facility]. SW paged physician to make him aware. Also, to inquire if the projected d/c plan was still for today.  SW is awaiting awaiting response from physician. SW consulted nurse.  Crista CurbBrittney Levern Pitter, MSW [phone]

## 2015-12-08 NOTE — Progress Notes (Signed)
Patient ID: Daisy FolksRebecca E Legendre, female   DOB: October 25, 1950, 65 y.o.   MRN: [id number]     Subjective:  Patient reports pain as mild.  Patient sitting in the chair and reports that she has made great improvment  Objective:   VITALS:   Vitals:   12/07/15 1450 12/07/15 1508 12/07/15 2059 12/08/15 0515  BP: 133/61  (!) 131/56 140/82  Pulse: 94  86 85  Resp: 18  18 18   Temp: 98.9 F (37.2 C)  97.9 F (36.6 C) 97.1 F (36.2 C)  TempSrc: Oral  Oral Oral  SpO2: 91% 94% 97% 97%    ABD soft Sensation intact distally Dorsiflexion/Plantar flexion intact Incision: dressing C/D/I and scant drainage   Lab Results  Component Value Date   WBC 8.5 12/08/2015   HGB 8.7 (L) 12/08/2015   HCT 25.8 (L) 12/08/2015   MCV 88.4 12/08/2015   PLT 177 12/08/2015   BMET    Component Value Date/Time   NA 135 12/07/2015 0617   K 4.2 12/07/2015 0617   CL 101 12/07/2015 0617   CO2 30 12/07/2015 0617   GLUCOSE 95 12/07/2015 0617   BUN 5 (L) 12/07/2015 0617   CREATININE 0.38 (L) 12/07/2015 0617   CALCIUM 8.3 (L) 12/07/2015 0617   GFRNONAA >60 12/07/2015 0617   GFRAA >60 12/07/2015 0617     Assessment/Plan: 3 Days Post-Op   Principal Problem:   Hip fx, right, closed, initial encounter (HCC) Active Problems:   Essential hypertension   COPD GOLD III/ still smoking    Chronic respiratory failure with hypercapnia (HCC)   Cigarette smoker   Hyponatremia   Chronic pain   Advance diet Up with therapy WBAT  Dry dressing PRN  Okay for DC Continue plan per medicine    Haskel KhanDOUGLAS PARRY, BRANDON 12/08/2015, 8:34 AM  Seen and agree with above.  Ok for [employer] from our standpoint.    Teryl LucyJoshua Ashlin Hidalgo, MD Cell [phone]

## 2015-12-08 NOTE — Progress Notes (Signed)
Physical Therapy Treatment Patient Details Name: Dustin FolksRebecca E Whitcomb MRN: [id number] DOB: May 13, 1950 Today's Date: 12/08/2015    History of Present Illness Pt is a 65 y.o. female s/p R hip hemiarthroplasty secondary to R displaced femoral neck fracture due to a fall. Pt has a past medical history significant for COPD, chronic respiratory failure on 2 L nasal cannula at baseline, HTN, hyperlipidemia. Pt with history of chronic pain and daily OxyContin and oxycodone use which may have impacted fall.    PT Comments    Pt is limited this visit by increased discomfort in right hip following muscle spasms over night. Initiated standing therex and performed gait training with increased difficulty with wbing through rle. Increased discomfort with going from sit<>standing this visit. Continues to maintain gait distance as compared to previous sessions.   Follow Up Recommendations  SNF     Equipment Recommendations  Rolling walker with 5" wheels    Recommendations for Other Services       Precautions / Restrictions Precautions Precautions: Posterior Hip Precaution Booklet Issued: Yes (comment) Precaution Comments: Reviewed precautions, client can recall 2/3  Restrictions Weight Bearing Restrictions: Yes RLE Weight Bearing: Weight bearing as tolerated Other Position/Activity Restrictions: posterior hip precautions    Mobility  Bed Mobility                  Transfers Overall transfer level: Needs assistance Equipment used: Rolling walker (2 wheeled) Transfers: Sit to/from Stand Sit to Stand: Min guard         General transfer comment: Min gaurd from lower recliner with cues for maintaining hip precautions  Ambulation/Gait Ambulation/Gait assistance: Min assist Ambulation Distance (Feet): 30 Feet Assistive device: Rolling walker (2 wheeled) Gait Pattern/deviations: Step-through pattern;Decreased step length - left;Decreased stance time - right Gait velocity:  decreased Gait velocity interpretation: <1.8 ft/sec, indicative of risk for recurrent falls General Gait Details: pt with minimal antaglic gait this visit. requires cues for proper sequencing   Stairs            Wheelchair Mobility    Modified Rankin (Stroke Patients Only)       Balance Overall balance assessment: Needs assistance Sitting-balance support: Feet supported Sitting balance-Leahy Scale: Fair     Standing balance support: Bilateral upper extremity supported Standing balance-Leahy Scale: Poor Standing balance comment: Requires UE support to maintain balance at [employer]                    Cognition Arousal/Alertness: Awake/alert Behavior During Therapy: [facility] for tasks assessed/performed Overall Cognitive Status: Within Functional Limits for tasks assessed                      Exercises Total Joint Exercises Long Arc Quad: AROM;Right;10 reps;Seated Knee Flexion: AROM;Right;10 reps;Standing Marching in Standing: AROM;10 reps;Right    General Comments        Pertinent Vitals/Pain Pain Assessment: 0-10 Pain Score: 4  Pain Location: right thigh Pain Descriptors / Indicators: Aching;Burning Pain Intervention(s): Limited activity within patient's tolerance;Monitored during session;Repositioned    Home Living                      Prior Function            PT Goals (current goals can now be found in the care plan section) Progress towards PT goals: Progressing toward goals    Frequency    Min 5X/week      PT Plan  Co-evaluation             End of Session Equipment Utilized During Treatment: Gait belt Activity Tolerance: Patient tolerated treatment well;Patient limited by pain Patient left: in chair;with call bell/phone within reach;Other (comment) (Respiratory in room)     Time: [id number] PT Time Calculation (min) (ACUTE ONLY): 23 min  Charges:  $Gait Training: 8-22 mins                    G Codes:       Colin BroachSabra M. Kensey Luepke PT, DPT  [phone]  12/08/2015, 9:46 AM

## 2015-12-08 NOTE — Clinical Social Work Placement (Signed)
   CLINICAL SOCIAL WORK PLACEMENT  NOTE  Date:  12/08/2015  Patient Details  Name: Daisy FolksRebecca E Pham MRN: [id number] Date of Birth: [birth year]  Clinical Social Work is seeking post-discharge placement for this patient at the Skilled  Nursing Facility level of care (*CSW will initial, date and re-position this form in  chart as items are completed):  Yes   Patient/family provided with [place] Clinical Social Work Department's list of facilities offering this level of care within the geographic area requested by the patient (or if unable, by the patient's family).  Yes   Patient/family informed of their freedom to choose among providers that offer the needed level of care, that participate in Medicare, Medicaid or managed care program needed by the patient, have an available bed and are willing to accept the patient.  Yes   Patient/family informed of [facility] [place]'s ownership interest in [facility] and [facility], as well as of the fact that they are under no obligation to receive care at these facilities.  PASRR submitted to EDS on       PASRR number received on       Existing PASRR number confirmed on 12/07/15     FL2 transmitted to all facilities in geographic area requested by pt/family on 12/07/15     FL2 transmitted to all facilities within larger geographic area on 12/07/15     Patient informed that his/her managed care company has contracts with or will negotiate with certain facilities, including the following:        Yes   Patient/family informed of bed offers received.  Patient chooses bed at  [facility]     Physician recommends and patient chooses bed at      Patient to be transferred to  [facility] on 12/08/15.  Patient to be transferred to facility by  Sharin Mons([facility])     Patient family notified on   of transfer.  Name of family member notified:        PHYSICIAN       Additional Comment:     _______________________________________________ Alene MiresWhitaker, Yuka Lallier R 12/08/2015, 4:12 PM

## 2015-12-08 NOTE — Discharge Summary (Signed)
Physician Discharge Summary  [name] VWU:[id number] DOB: [birth year] DOA: 12/05/2015  PCP: Delorse Lek, MD  Admit date: 12/05/2015 Discharge date: 12/08/2015  Time spent: 35 minutes  Recommendations for Outpatient Follow-up:  Follow up with orthopedic surgery (Dr. Dion Saucier) in 2 weeks Weight bearing as tolerated Physical rehabilitation as per SNF protocol Repeat CBC in 5 days to follow up Hgb trend  Repeat BMET in 5 days to follow electrolytes trend and renal function  Please continue following/supporting and assisting patient for smoking cessation    Discharge Diagnoses:  Principal Problem:   Hip fx, right, closed, initial encounter [facility]) Active Problems:   Essential hypertension   COPD GOLD III/ still smoking    Chronic respiratory failure with hypercapnia (HCC)   Cigarette smoker   Hyponatremia   Chronic pain   S/P ORIF (open reduction internal fixation) fracture   Chronic pain syndrome   Acute blood loss anemia   Discharge Condition: stable and improved. Discharge to SNF for rehabilitation and further care.  Diet recommendation: heart healthy diet (given hx of HTN)  History of present illness:  65 y.o.femalewith medical history significant COPD,chronic respiratory failure on 2 L nasal cannula at baseline, smokerchronic back pain secondary to multiple lumbar surgeries in the past, hypertension, hyperlipidemia presents to the emergency department chief complaint of right hip pain after a fall. Initial evaluation reveals right hip fracture.  Information is obtained from the husband who is at the bedside. He states he was asleep and heard patient call out to him. She was in the kitchen and had fallen. He reports her lower extremities have decreased sensation and she "frequently falls". Of note she has a history of acute encephalopathy secondary to aspiration pneumonia in the setting of excessive lethargy due to narcotics x2.  Husband does report patient takes  large amount of narcotics daily and that sometimes it "gets ahead of her" and this may be contributing to a fall. He states recently the dosages have been reduced by PCP. He reports OxyContin reduced from 20 mg every 12 hours to 15 and OxyIR reduced from 10 mg to 5 mg every 8hours  Hospital Course:  1. Right hip fracture presumably from mechanical fall in the setting of  narcotic use.  -X-ray showed displaced acute right femoral neck fracture.  -S/P Right hip hemiarthroplasty 10-29 -PT recommended SNF -will follow up with Dr. Dion Saucier in 2 weeks -continue PRN pain meds and lovenox for DVT prophylaxis  -weight bearing as tolerated   2. COPD Gold III. patient on home oxygen at baseline. -Chronic respiratory failure with hypercapnia.  -will continue Oxygen supplementation as previously prescribed (per patient mainly at night and PRN with exertion) -Continue inhalers and nebulizers as instructed by pulmonary service  -encourage to quit smoking  3. Hypertension.  -will continue clonidine and norvasc -BP is well controlled currently  4. Hyponatremia. Sodium level 127. Chart review indicates some chronic hyponatremia.  -resolved  with IV fluids.   5. Tobacco use.  -Cessation counseling provided -nicotine patch provided at discharge  6. Chronic pain. Home medications include oxycodone 5 mg every 8hours as needed as well as OxyContin 15 mg every 12 hours. Patient was very  lethargic on admission. Has a history of acute encephalopathy secondary to overmedication. Experienced withdrawals during recent hospitalization on the ventilator.  -Will continue current oxycontin and oxycodone at adjusted dose (lower dosage) -Monitor pain closely. Watch for any signs/symptoms of Oversedation and/or withdrawal. -patient to be discharge to SNF, she will need supervised environment.  Goal should be to titrate her  off chronic narcotics over time.  -long discussion with husband, he will need to be on charge  of her pain medication at discharge from SNF> also recommend for patient to follow with her pain clinic specialist.  7-Acute blood loss anemia; post op, expected. -Hb on admission 13---9.9--8.2--8.7 at discharge -no signs of acute bleeding -no transfusion needed  -discharge on ferrous sulfate TID -recommended CBC in 5 days to follow Hgb trend   8-Left otitis;  -Otic ciprofloxacin drops -will finish antibiotics drops as instructed    Procedures: Right hip hemiarthroplasty 10-29  Consultations:  Orthopedic service (Dr. Dion Saucier)  Discharge Exam: Vitals:   12/08/15 0515 12/08/15 1413  BP: 140/82 (!) 113/51  Pulse: 85 84  Resp: 18 17  Temp: 97.1 F (36.2 C) 98.8 F (37.1 C)   General exam: Appears calm and comfortable, reports pain to be well controlled. No fever and no acute complaints or distress Respiratory system: Clear to auscultation. Respiratory effort normal. Cardiovascular system: S1 & S2 heard, RRR. No JVD, murmurs, rubs, gallops or clicks. No pedal edema. Gastrointestinal system: Abdomen is nondistended, soft and nontender. No organomegaly or masses felt. Normal bowel sounds heard. Central nervous system: Alert and oriented. No focal neurological deficits. Extremities: Symmetric 5 x 5 power. Right hip with clean dressing,.   Discharge Instructions   Discharge Instructions    Discharge instructions    Complete by:  As directed    Take medications as prescribed Maintain adequate hydration  Follow up with orthopedic surgery (Dr. Dion Saucier) in 2 weeks Weight bearing as tolerated Physical rehabilitation as per SNF protocol Repeat CBC in 5 days to follow up Hgb trend  Repeat BMET in 5 days to follow electrolytes trend and renal function  Stop smoking   Weight bearing as tolerated    Complete by:  As directed      Current Discharge Medication List    START taking these medications   Details  bisacodyl (DULCOLAX) 10 MG suppository Place 1 suppository (10 mg  total) rectally daily as needed for moderate constipation.    ciprofloxacin (CILOXAN) 0.3 % ophthalmic solution Place 1 drop into the left ear every 4 (four) hours while awake. Administer 1 drop, every 2 hours, while awake, for 2 days. Then 1 drop, every 4 hours, while awake, for the next 5 days. Qty: 5 mL, Refills: 0    docusate sodium (COLACE) 100 MG capsule Take 1 capsule (100 mg total) by mouth 2 (two) times daily.    enoxaparin (LOVENOX) 40 MG/0.4ML injection Inject 0.4 mLs (40 mg total) into the skin daily. Qty: 21 Syringe, Refills: 0    ferrous sulfate 325 (65 FE) MG tablet Take 1 tablet (325 mg total) by mouth 3 (three) times daily after meals.    nicotine (NICODERM CQ - DOSED IN MG/24 HOURS) 21 mg/24hr patch Place 1 patch (21 mg total) onto the skin daily.    sennosides-docusate sodium (SENOKOT-S) 8.6-50 MG tablet Take 2 tablets by mouth daily. Qty: 30 tablet, Refills: 1    zolpidem (AMBIEN) 5 MG tablet Take 1 tablet (5 mg total) by mouth at bedtime as needed for sleep. Qty: 20 tablet, Refills: 0      CONTINUE these medications which have CHANGED   Details  oxyCODONE (OXY IR/ROXICODONE) 5 MG immediate release tablet Take 1-2 tablets (5-10 mg total) by mouth every 6 (six) hours as needed for severe pain or breakthrough pain. Qty: 20 tablet, Refills: 0  oxyCODONE (OXYCONTIN) 10 mg 12 hr tablet Take 1 tablet (10 mg total) by mouth every 12 (twelve) hours. Qty: 20 tablet, Refills: 0      CONTINUE these medications which have NOT CHANGED   Details  acetaminophen (TYLENOL) 500 MG tablet Take 500 mg by mouth every 6 (six) hours as needed (pain).    albuterol (PROAIR HFA) 108 (90 BASE) MCG/ACT inhaler 2 puffs every 4 hours as needed only  if your can't catch your breath    albuterol (PROVENTIL) (2.5 MG/3ML) 0.083% nebulizer solution Take 3 mLs (2.5 mg total) by nebulization every 6 (six) hours as needed for wheezing or shortness of breath. Qty: 75 mL, Refills: 12     amLODipine (NORVASC) 10 MG tablet Take 10 mg by mouth at bedtime.    aspirin EC 81 MG tablet Take 81 mg by mouth at bedtime.    baclofen (LIORESAL) 10 MG tablet Take 10 mg by mouth at bedtime as needed for muscle spasms.    budesonide-formoterol (SYMBICORT) 160-4.5 MCG/ACT inhaler Inhale 2 puffs into the lungs 2 (two) times daily as needed (shortness of breath/ wheezing).     Calcium Carbonate-Vitamin D (CALCIUM 600+D) 600-400 MG-UNIT tablet Take 1 tablet by mouth at bedtime.    citalopram (CELEXA) 20 MG tablet Take 20 mg by mouth at bedtime.  Refills: 3    cloNIDine (CATAPRES) 0.1 MG tablet Take 1 tablet (0.1 mg total) by mouth 2 (two) times daily. Qty: 60 tablet, Refills: 0    diclofenac (VOLTAREN) 75 MG EC tablet Take 1 tablet (75 mg total) by mouth 2 (two) times daily. Qty: 30 tablet, Refills: 0    gabapentin (NEURONTIN) 300 MG capsule Take 300-600 mg by mouth See admin instructions. Take 1 capsule (300 mg) by mouth every morning and 2 capsules (600 mg) at bedtime    MAGNESIUM-ZINC PO Take 1 tablet by mouth at bedtime.    Multiple Vitamin (MULTIVITAMIN WITH MINERALS) TABS Take 1 tablet by mouth at bedtime.     Omega-3 Fatty Acids (FISH OIL) 1200 MG CAPS Take 2,400 mg by mouth at bedtime.    OXYGEN Inhale 2 L into the lungs See admin instructions. Wear 2L at bedtime and with activity as needed for shortness of breath     Phenyleph-Doxylamine-DM-APAP (ALKA SELTZER PLUS PO) Take 1 tablet by mouth 4 (four) times daily as needed (heartburn/ indigestion/ nausea).    polyethylene glycol (MIRALAX / GLYCOLAX) packet Take 17 g by mouth daily as needed for mild constipation. Qty: 14 each, Refills: 0    POTASSIUM GLUCONATE PO Take 1 tablet by mouth at bedtime.     pravastatin (PRAVACHOL) 40 MG tablet Take 40 mg by mouth at bedtime.     ranitidine (ZANTAC) 150 MG tablet Take 150 mg by mouth at bedtime.    rOPINIRole (REQUIP) 0.25 MG tablet Take 0.5 mg by mouth at bedtime.    sodium  chloride (OCEAN) 0.65 % SOLN nasal spray Place 1 spray into both nostrils as needed for congestion. Refills: 0      STOP taking these medications     Ibuprofen-Diphenhydramine Cit (ADVIL PM PO)      predniSONE (DELTASONE) 50 MG tablet      varenicline (CHANTIX PAK) 0.5 MG X 11 & 1 MG X 42 tablet        No Known Allergies Follow-up Information    LANDAU,JOSHUA P, MD. Schedule an appointment as soon as possible for a visit in 2 week(s).   Specialty:  Orthopedic  Surgery Contact information: [address] [place] [address] [phone]        Delorse LekBURNETT,BRENT A, MD. Schedule an appointment as soon as possible for a visit in 1 week(s).   Specialty:  Family Medicine Why:  after discharge from SNF Contact information: [address] [place] [address] [phone]           The results of significant diagnostics from this hospitalization (including imaging, microbiology, ancillary and laboratory) are listed below for reference.    Significant Diagnostic Studies: Dg Pelvis 1-2 Views  Result Date: 12/05/2015 CLINICAL DATA:  Severe right hip pain following a fall today. EXAM: PELVIS - 1-2 VIEW COMPARISON:  Pelvis CT dated 07/04/2013. FINDINGS: Again demonstrated is lower lumbar spine and right pelvic fixation hardware. An interval right femoral neck fracture is demonstrated with varus angulation and mild proximal displacement of the distal fragment. Diffuse osteopenia is noted. Prominent stool in the colon. IMPRESSION: Acute right femoral neck fracture. Electronically Signed   By: Beckie SaltsSteven  Reid M.D.   On: 12/05/2015 10:11   Chest Portable 1 View  Result Date: 12/05/2015 CLINICAL DATA:  Hip fracture EXAM: PORTABLE CHEST 1 VIEW COMPARISON:  None. FINDINGS: Chronic interstitial markings. No focal consolidation. No pleural effusion or pneumothorax. The heart is top-normal in size. Thoracolumbar spinal rods. IMPRESSION: No evidence of acute  cardiopulmonary disease. Electronically Signed   By: Charline BillsSriyesh  Krishnan M.D.   On: 12/05/2015 12:29   Dg Hip Port Unilat With Pelvis 1v Right  Result Date: 12/05/2015 CLINICAL DATA:  Status post internal fixation of right hip fracture. Initial encounter. EXAM: DG HIP (WITH OR WITHOUT PELVIS) 1V PORT RIGHT COMPARISON:  None. FINDINGS: The patient's right hip arthroplasty appears grossly intact, without evidence of loosening or new fracture. The left hip joint is unremarkable in appearance. Lumbosacral spinal fusion hardware appears to demonstrate a chronic fracture on the right side, at L4-L5, unchanged from 2015. There is fusion of the right sacroiliac joint. Postoperative soft tissue air is seen overlying the right hip. IMPRESSION: Status post right hip arthroplasty. Prosthesis appears grossly intact, without evidence of loosening or new fracture. Electronically Signed   By: Roanna RaiderJeffery  Chang M.D.   On: 12/05/2015 19:45   Dg Femur, Min 2 Views Right  Result Date: 12/05/2015 CLINICAL DATA:  Fall EXAM: RIGHT FEMUR 2 VIEWS COMPARISON:  None. FINDINGS: There is a displaced fracture of the right femoral neck. Fracture occurs in the mid neck region. There is some rotation at the fracture site. IMPRESSION: Displaced acute right femoral neck fracture. Electronically Signed   By: Jolaine ClickArthur  Hoss M.D.   On: 12/05/2015 10:09    Microbiology: Recent Results (from the past 240 hour(s))  Surgical pcr screen     Status: Abnormal   Collection Time: 12/05/15 12:38 PM  Result Value Ref Range Status   MRSA, PCR NEGATIVE NEGATIVE Final   Staphylococcus aureus POSITIVE (A) NEGATIVE Final    Comment:        The Xpert SA Assay (FDA approved for NASAL specimens in patients over 67 years of age), is one component of a comprehensive surveillance program.  Test performance has been validated by [facility] for patients greater than or equal to 67 year old. It is not intended to diagnose infection nor to guide or  monitor treatment.      Labs: Basic Metabolic Panel:  Recent Labs Lab 12/05/15 0925 12/06/15 0404 12/07/15 0617  NA 127* 130* 135  K 4.5 4.3  4.2  CL 93* 97* 101  CO2 27 27 30   GLUCOSE 87 102* 95  BUN 10 7 5*  CREATININE 0.50 0.48 0.38*  CALCIUM 9.1 8.3* 8.3*   CBC:  Recent Labs Lab 12/05/15 0925 12/06/15 0404 12/07/15 0617 12/08/15 0621  WBC 12.8* 10.6* 8.4 8.5  NEUTROABS 9.9*  --   --   --   HGB 13.0 9.9* 8.2* 8.7*  HCT 38.1 29.6* 24.0* 25.8*  MCV 88.0 88.6 88.6 88.4  PLT 222 174 140* 177    Signed:  Vassie LollMadera, Beena Catano MD.  [facility] 12/08/2015, 3:14 PM

## 2015-12-08 NOTE — Progress Notes (Signed)
SW spoke with physician who states that he will meet with patient and go over labs to determine is she will be stable today.   SW reached out to facility who states that pt is welcomed to come today. However, they do not accept admissions past 4:00pm. SW will continue to follow up.  Daisy CurbBrittney Robinn Overholt, MSW

## 2015-12-08 NOTE — Progress Notes (Signed)
Attempted to call report to [facility], number provided by SW [phone]--answering machine picked up only identifying this number and unable to come to the phone at this time. A generic message left to return call for a report on a pt to come [facility]. No name left of patient --only that a pt to come today and her spouse came already today to complete paperwork.  Pt still awaiting [facility] transport to take to facility.

## 2015-12-08 NOTE — Progress Notes (Signed)
[facility] transportation here to take [facility]. Pt d/c'd with belongings, husband present at time of discharge. Copy of pt's d/c instructions were given to her and reviewed with her and her husband. Scripts given to husband to give facility if they require them for pt meds. Pt d/c'd at this time.

## 2016-02-27 ENCOUNTER — Observation Stay (HOSPITAL_COMMUNITY)
Admission: EM | Admit: 2016-02-27 | Discharge: 2016-02-28 | Disposition: A | Discharge status: Home / Self Care | Payer: Medicare Other | Attending: Internal Medicine | Admitting: Internal Medicine

## 2016-02-27 ENCOUNTER — Emergency Department (HOSPITAL_COMMUNITY): Payer: Medicare Other

## 2016-02-27 DIAGNOSIS — E876 Hypokalemia: Secondary | ICD-10-CM | POA: Insufficient documentation

## 2016-02-27 DIAGNOSIS — Z9119 Patient's noncompliance with other medical treatment and regimen: Secondary | ICD-10-CM | POA: Diagnosis not present

## 2016-02-27 DIAGNOSIS — Z7952 Long term (current) use of systemic steroids: Secondary | ICD-10-CM | POA: Diagnosis not present

## 2016-02-27 DIAGNOSIS — E871 Hypo-osmolality and hyponatremia: Secondary | ICD-10-CM | POA: Diagnosis not present

## 2016-02-27 DIAGNOSIS — G9340 Encephalopathy, unspecified: Secondary | ICD-10-CM | POA: Diagnosis present

## 2016-02-27 DIAGNOSIS — Z9981 Dependence on supplemental oxygen: Secondary | ICD-10-CM | POA: Diagnosis not present

## 2016-02-27 DIAGNOSIS — F17210 Nicotine dependence, cigarettes, uncomplicated: Secondary | ICD-10-CM | POA: Diagnosis not present

## 2016-02-27 DIAGNOSIS — M549 Dorsalgia, unspecified: Secondary | ICD-10-CM | POA: Insufficient documentation

## 2016-02-27 DIAGNOSIS — I129 Hypertensive chronic kidney disease with stage 1 through stage 4 chronic kidney disease, or unspecified chronic kidney disease: Secondary | ICD-10-CM | POA: Diagnosis not present

## 2016-02-27 DIAGNOSIS — J09X1 Influenza due to identified novel influenza A virus with pneumonia: Secondary | ICD-10-CM | POA: Diagnosis not present

## 2016-02-27 DIAGNOSIS — J111 Influenza due to unidentified influenza virus with other respiratory manifestations: Secondary | ICD-10-CM | POA: Diagnosis present

## 2016-02-27 DIAGNOSIS — J441 Chronic obstructive pulmonary disease with (acute) exacerbation: Secondary | ICD-10-CM | POA: Diagnosis not present

## 2016-02-27 DIAGNOSIS — E785 Hyperlipidemia, unspecified: Secondary | ICD-10-CM | POA: Diagnosis not present

## 2016-02-27 DIAGNOSIS — Z7982 Long term (current) use of aspirin: Secondary | ICD-10-CM | POA: Diagnosis not present

## 2016-02-27 DIAGNOSIS — J440 Chronic obstructive pulmonary disease with acute lower respiratory infection: Secondary | ICD-10-CM

## 2016-02-27 DIAGNOSIS — J9621 Acute and chronic respiratory failure with hypoxia: Secondary | ICD-10-CM | POA: Diagnosis not present

## 2016-02-27 DIAGNOSIS — Z79899 Other long term (current) drug therapy: Secondary | ICD-10-CM | POA: Insufficient documentation

## 2016-02-27 DIAGNOSIS — G894 Chronic pain syndrome: Secondary | ICD-10-CM | POA: Diagnosis not present

## 2016-02-27 DIAGNOSIS — R410 Disorientation, unspecified: Secondary | ICD-10-CM

## 2016-02-27 DIAGNOSIS — J9622 Acute and chronic respiratory failure with hypercapnia: Secondary | ICD-10-CM

## 2016-02-27 DIAGNOSIS — J449 Chronic obstructive pulmonary disease, unspecified: Secondary | ICD-10-CM | POA: Diagnosis present

## 2016-02-27 LAB — URINALYSIS, ROUTINE W REFLEX MICROSCOPIC
BACTERIA UA: NONE SEEN
BILIRUBIN URINE: NEGATIVE
Glucose, UA: NEGATIVE mg/dL
Hgb urine dipstick: NEGATIVE
Ketones, ur: 20 mg/dL — AB
Leukocytes, UA: NEGATIVE
NITRITE: NEGATIVE
Protein, ur: 100 mg/dL — AB
SPECIFIC GRAVITY, URINE: 1.013 (ref 1.005–1.030)
SQUAMOUS EPITHELIAL / LPF: NONE SEEN
pH: 7 (ref 5.0–8.0)

## 2016-02-27 LAB — COMPREHENSIVE METABOLIC PANEL
ALK PHOS: 125 U/L (ref 38–126)
ALT: 20 U/L (ref 14–54)
AST: 27 U/L (ref 15–41)
Albumin: 3.3 g/dL — ABNORMAL LOW (ref 3.5–5.0)
Anion gap: 11 (ref 5–15)
BUN: 6 mg/dL (ref 6–20)
CALCIUM: 8.8 mg/dL — AB (ref 8.9–10.3)
CHLORIDE: 86 mmol/L — AB (ref 101–111)
CO2: 28 mmol/L (ref 22–32)
CREATININE: 0.43 mg/dL — AB (ref 0.44–1.00)
GFR calc non Af Amer: >60 mL/min (ref >60)
Glucose, Bld: 120 mg/dL — ABNORMAL HIGH (ref 65–99)
Potassium: 3.7 mmol/L (ref 3.5–5.1)
SODIUM: 125 mmol/L — AB (ref 135–145)
Total Bilirubin: 0.3 mg/dL (ref 0.3–1.2)
Total Protein: 6.5 g/dL (ref 6.5–8.1)

## 2016-02-27 LAB — CBC WITH DIFFERENTIAL/PLATELET
Basophils Absolute: 0 10*3/uL (ref 0.0–0.1)
Basophils Relative: 0 %
EOS ABS: 0 10*3/uL (ref 0.0–0.7)
EOS PCT: 0 %
HCT: 39.4 % (ref 36.0–46.0)
Hemoglobin: 13.3 g/dL (ref 12.0–15.0)
LYMPHS ABS: 0.8 10*3/uL (ref 0.7–4.0)
LYMPHS PCT: 4 %
MCH: 29.6 pg (ref 26.0–34.0)
MCHC: 33.8 g/dL (ref 30.0–36.0)
MCV: 87.6 fL (ref 78.0–100.0)
MONO ABS: 1.4 10*3/uL — AB (ref 0.1–1.0)
MONOS PCT: 7 %
Neutro Abs: 17.1 10*3/uL — ABNORMAL HIGH (ref 1.7–7.7)
Neutrophils Relative %: 89 %
PLATELETS: 173 10*3/uL (ref 150–400)
RBC: 4.5 MIL/uL (ref 3.87–5.11)
RDW: 13.7 % (ref 11.5–15.5)
WBC: 19.3 10*3/uL — ABNORMAL HIGH (ref 4.0–10.5)

## 2016-02-27 LAB — INFLUENZA PANEL BY PCR (TYPE A & B)
Influenza A By PCR: POSITIVE — AB
Influenza B By PCR: NEGATIVE

## 2016-02-27 LAB — I-STAT CG4 LACTIC ACID, ED: Lactic Acid, Venous: 0.57 mmol/L (ref 0.5–1.9)

## 2016-02-27 LAB — I-STAT ARTERIAL BLOOD GAS, ED
Acid-Base Excess: 8 mmol/L — ABNORMAL HIGH (ref 0.0–2.0)
Bicarbonate: 35 mmol/L — ABNORMAL HIGH (ref 20.0–28.0)
O2 Saturation: 95 %
Patient temperature: 98.6
TCO2: 37 mmol/L (ref 0–100)
pCO2 arterial: 55.4 mmHg — ABNORMAL HIGH (ref 32.0–48.0)
pH, Arterial: 7.408 (ref 7.350–7.450)
pO2, Arterial: 79 mmHg — ABNORMAL LOW (ref 83.0–108.0)

## 2016-02-27 LAB — PROTIME-INR
INR: 1.08
PROTHROMBIN TIME: 14 s (ref 11.4–15.2)

## 2016-02-27 LAB — BRAIN NATRIURETIC PEPTIDE: B Natriuretic Peptide: 428.9 pg/mL — ABNORMAL HIGH (ref 0.0–100.0)

## 2016-02-27 LAB — I-STAT TROPONIN, ED: Troponin i, poc: 0.02 ng/mL (ref 0.00–0.08)

## 2016-02-27 LAB — TSH: TSH: 0.472 u[IU]/mL (ref 0.350–4.500)

## 2016-02-27 LAB — PROCALCITONIN: Procalcitonin: 0.27 ng/mL

## 2016-02-27 MED ORDER — ACETAMINOPHEN 500 MG PO TABS
500.0000 mg | ORAL_TABLET | Freq: Once | ORAL | Status: AC
  Administered 2016-02-27: 500 mg via ORAL
  Filled 2016-02-27: qty 1

## 2016-02-27 MED ORDER — AZITHROMYCIN 500 MG PO TABS
500.0000 mg | ORAL_TABLET | ORAL | Status: DC
  Administered 2016-02-27: 500 mg via ORAL
  Filled 2016-02-27: qty 2

## 2016-02-27 MED ORDER — OSELTAMIVIR PHOSPHATE 75 MG PO CAPS
75.0000 mg | ORAL_CAPSULE | Freq: Once | ORAL | Status: AC
  Administered 2016-02-27: 75 mg via ORAL
  Filled 2016-02-27: qty 1

## 2016-02-27 MED ORDER — HEPARIN SODIUM (PORCINE) 5000 UNIT/ML IJ SOLN
5000.0000 [IU] | Freq: Three times a day (TID) | INTRAMUSCULAR | Status: DC
  Administered 2016-02-27 – 2016-02-28 (×3): 5000 [IU] via SUBCUTANEOUS
  Filled 2016-02-27 (×3): qty 1

## 2016-02-27 MED ORDER — ROPINIROLE HCL 0.5 MG PO TABS
0.5000 mg | ORAL_TABLET | Freq: Every day | ORAL | Status: DC
Start: 2016-02-27 — End: 2016-02-28
  Administered 2016-02-27: 0.5 mg via ORAL
  Filled 2016-02-27 (×2): qty 1

## 2016-02-27 MED ORDER — METHYLPREDNISOLONE SODIUM SUCC 125 MG IJ SOLR
125.0000 mg | Freq: Once | INTRAMUSCULAR | Status: AC
  Administered 2016-02-27: 125 mg via INTRAVENOUS
  Filled 2016-02-27: qty 2

## 2016-02-27 MED ORDER — DEXTROSE 5 % IV SOLN
1.0000 g | INTRAVENOUS | Status: DC
  Administered 2016-02-27: 1 g via INTRAVENOUS
  Filled 2016-02-27 (×2): qty 10

## 2016-02-27 MED ORDER — SODIUM CHLORIDE 0.9 % IV SOLN
Freq: Once | INTRAVENOUS | Status: AC
Start: 2016-02-27 — End: 2016-02-27
  Administered 2016-02-27: 13:00:00 via INTRAVENOUS

## 2016-02-27 MED ORDER — CLONIDINE HCL 0.1 MG PO TABS
0.1000 mg | ORAL_TABLET | Freq: Two times a day (BID) | ORAL | Status: DC
  Administered 2016-02-28: 0.1 mg via ORAL
  Filled 2016-02-27: qty 1

## 2016-02-27 MED ORDER — MOMETASONE FURO-FORMOTEROL FUM 200-5 MCG/ACT IN AERO
2.0000 | INHALATION_SPRAY | Freq: Two times a day (BID) | RESPIRATORY_TRACT | Status: DC
  Filled 2016-02-27: qty 8.8

## 2016-02-27 MED ORDER — ASPIRIN EC 81 MG PO TBEC
81.0000 mg | DELAYED_RELEASE_TABLET | Freq: Every day | ORAL | Status: DC
  Administered 2016-02-27: 81 mg via ORAL
  Filled 2016-02-27: qty 1

## 2016-02-27 MED ORDER — ACETAMINOPHEN 325 MG PO TABS: 650.0000 mg | ORAL_TABLET | Freq: Four times a day (QID) | ORAL | Status: DC | PRN

## 2016-02-27 MED ORDER — OXYCODONE HCL 5 MG PO TABS
5.0000 mg | ORAL_TABLET | Freq: Four times a day (QID) | ORAL | Status: DC | PRN
  Administered 2016-02-27 – 2016-02-28 (×2): 10 mg via ORAL
  Filled 2016-02-27 (×2): qty 2

## 2016-02-27 MED ORDER — OXYCODONE HCL ER 10 MG PO T12A
10.0000 mg | EXTENDED_RELEASE_TABLET | Freq: Two times a day (BID) | ORAL | Status: DC
  Administered 2016-02-27 – 2016-02-28 (×2): 10 mg via ORAL
  Filled 2016-02-27 (×2): qty 1

## 2016-02-27 MED ORDER — AMLODIPINE BESYLATE 5 MG PO TABS
10.0000 mg | ORAL_TABLET | Freq: Every day | ORAL | Status: DC
  Administered 2016-02-27: 10 mg via ORAL
  Filled 2016-02-27: qty 2

## 2016-02-27 MED ORDER — IPRATROPIUM-ALBUTEROL 0.5-2.5 (3) MG/3ML IN SOLN
RESPIRATORY_TRACT | Status: AC
  Administered 2016-02-27: 3 mL
  Filled 2016-02-27: qty 3

## 2016-02-27 MED ORDER — SENNOSIDES-DOCUSATE SODIUM 8.6-50 MG PO TABS
1.0000 | ORAL_TABLET | Freq: Every day | ORAL | Status: DC
  Administered 2016-02-27: 1 via ORAL
  Filled 2016-02-27: qty 1

## 2016-02-27 MED ORDER — BISACODYL 10 MG RE SUPP: 10.0000 mg | Freq: Every day | RECTAL | Status: DC | PRN

## 2016-02-27 MED ORDER — PROMETHAZINE HCL 25 MG PO TABS: 12.5000 mg | ORAL_TABLET | Freq: Four times a day (QID) | ORAL | Status: DC | PRN

## 2016-02-27 MED ORDER — IPRATROPIUM-ALBUTEROL 0.5-2.5 (3) MG/3ML IN SOLN
3.0000 mL | RESPIRATORY_TRACT | Status: DC
  Administered 2016-02-27 (×2): 3 mL via RESPIRATORY_TRACT

## 2016-02-27 MED ORDER — CITALOPRAM HYDROBROMIDE 10 MG PO TABS
20.0000 mg | ORAL_TABLET | Freq: Every day | ORAL | Status: DC
  Administered 2016-02-27: 20 mg via ORAL
  Filled 2016-02-27 (×2): qty 2

## 2016-02-27 MED ORDER — IPRATROPIUM-ALBUTEROL 0.5-2.5 (3) MG/3ML IN SOLN: 3.0000 mL | RESPIRATORY_TRACT | Status: DC | PRN

## 2016-02-27 MED ORDER — NAPROXEN 250 MG PO TABS
500.0000 mg | ORAL_TABLET | Freq: Two times a day (BID) | ORAL | Status: DC
  Administered 2016-02-27 – 2016-02-28 (×2): 500 mg via ORAL
  Filled 2016-02-27 (×2): qty 2

## 2016-02-27 MED ORDER — PRAVASTATIN SODIUM 40 MG PO TABS
40.0000 mg | ORAL_TABLET | Freq: Every day | ORAL | Status: DC
  Administered 2016-02-27: 40 mg via ORAL
  Filled 2016-02-27: qty 1

## 2016-02-27 MED ORDER — IPRATROPIUM-ALBUTEROL 0.5-2.5 (3) MG/3ML IN SOLN
3.0000 mL | Freq: Three times a day (TID) | RESPIRATORY_TRACT | Status: DC
  Administered 2016-02-28: 3 mL via RESPIRATORY_TRACT
  Filled 2016-02-27 (×2): qty 3

## 2016-02-27 MED ORDER — ACETAMINOPHEN 650 MG RE SUPP: 650.0000 mg | Freq: Four times a day (QID) | RECTAL | Status: DC | PRN

## 2016-02-27 MED ORDER — OSELTAMIVIR PHOSPHATE 75 MG PO CAPS
75.0000 mg | ORAL_CAPSULE | Freq: Two times a day (BID) | ORAL | Status: DC
  Administered 2016-02-27 – 2016-02-28 (×2): 75 mg via ORAL
  Filled 2016-02-27 (×3): qty 1

## 2016-02-27 MED ORDER — IPRATROPIUM-ALBUTEROL 0.5-2.5 (3) MG/3ML IN SOLN
3.0000 mL | Freq: Once | RESPIRATORY_TRACT | Status: AC
  Administered 2016-02-27: 3 mL via RESPIRATORY_TRACT
  Filled 2016-02-27: qty 3

## 2016-02-27 NOTE — ED Triage Notes (Signed)
Pt has been sick since Wednesday -- fever, urinary incontinence, cough, wears O2 at home -does smoke at home

## 2016-02-27 NOTE — ED Notes (Signed)
Patient undressed, in gown, on monitor, continuous pulse oximetry, blood pressure cuff and oxygen [place] (2L); Visitor at bedside

## 2016-02-27 NOTE — Progress Notes (Signed)
Pharmacy Antibiotic Note Dustin FolksRebecca E Kwolek is a 66 y.o. female admitted on 02/27/2016 with pneumonia.  Pharmacy has been consulted for ceftriaxone dosing.  Plan: 1. Begin ceftriaxone 1 gram IV every 24 hours  2. Pharmacy to follow peripherally as medication does not require any renal dose adjustment    Height: 5\' 2"  (157.5 cm) Weight: 150 lb (68 kg) IBW/kg (Calculated) : 50.1  Temp (24hrs), Avg:98.7 F (37.1 C), Min:98.7 F (37.1 C), Max:98.7 F (37.1 C)   Recent Labs Lab 02/27/16 1027 02/27/16 1034  WBC 19.3*  --   CREATININE 0.43*  --   LATICACIDVEN  --  0.57    Estimated Creatinine Clearance: 61.7 mL/min (by C-G formula based on SCr of 0.43 mg/dL (L)).    No Known Allergies  Pollyann SamplesAndy Aedyn Kempfer, PharmD, BCPS 02/27/2016, 3:41 PM

## 2016-02-27 NOTE — ED Notes (Signed)
Steward DroneBrenda, RN assisted me with in and out cath; visitor stayed at bedside for event

## 2016-02-27 NOTE — ED Provider Notes (Signed)
[facility]-EMERGENCY DEPT Provider Note   CSN: [id number] Arrival date & time: 02/27/16  1011     History   Chief Complaint Chief Complaint  Patient presents with  . Fever  . Urinary Tract Infection    HPI [name] is a 66 y.o. female.  HPI Patient started becoming ill 5 days ago. She has had fever at home. Temperature was up to 101. The patient has had cough. Her daughter reports that she has not endorsed any chest pain. There has been no vomiting or diarrhea but patient has had episodes of urinary incontinence. The patient became very confused intermittently. Yesterday there was a period of confusion but then it seemed improved by evening. The patient's husband however who is also sick and not at bedside, told the patient's daughter that last night was a very bad night with the patient awake and confused much of the night. The patient does have history of various musculoskeletal pain related problems. He has had a distant back surgery and has been having some problems with knee pain. Patient does take OxyContin extended release twice daily. She has had this morning's dose. Past Medical History:  Diagnosis Date  . Asthma   . Bronchitis   . COPD (chronic obstructive pulmonary disease) (HCC)   . Emphysema   . Encephalopathy 06/2014  . Hip fx, right, closed, initial encounter (HCC)   . Hyperlipidemia   . Hypertension   . Shortness of breath dyspnea   . UTI (urinary tract infection)     Patient Active Problem List   Diagnosis Date Noted  . S/P ORIF (open reduction internal fixation) fracture   . Chronic pain syndrome   . Acute blood loss anemia   . Hip fx, right, closed, initial encounter (HCC) 12/05/2015  . Pressure ulcer 07/07/2014  . Altered mental state   . Change in mental state   . Fever   . Acute encephalopathy 07/06/2014  . Narcotic overdose   . Hyponatremia 05/31/2014  . Lethargic 05/31/2014  . Chronic pain 05/31/2014  . Sepsis (HCC) 05/31/2014  . COPD GOLD  III/ still smoking  04/02/2014  . Chronic respiratory failure with hypercapnia (HCC) 04/02/2014  . Cigarette smoker 04/02/2014  . COPD exacerbation (HCC) 02/12/2012  . Essential hypertension 02/12/2012  . Tachycardia 02/12/2012    Past Surgical History:  Procedure Laterality Date  . ABDOMINAL HYSTERECTOMY    . APPENDECTOMY    . BACK SURGERY    . HIP ARTHROPLASTY Right 12/05/2015   Procedure: ARTHROPLASTY BIPOLAR HIP (HEMIARTHROPLASTY);  Surgeon: Teryl Lucy, MD;  Location: [facility] OR;  Service: Orthopedics;  Laterality: Right;  . INSERTION / PLACEMENT / REVISION NEUROSTIMULATOR    . removal of neurostimulator  06/2014    OB History    No data available       Home Medications    Prior to Admission medications   Medication Sig Start Date End Date Taking? Authorizing Provider  acetaminophen (TYLENOL) 500 MG tablet Take 500 mg by mouth every 6 (six) hours as needed (pain).    Historical Provider, MD  albuterol (PROAIR HFA) 108 (90 BASE) MCG/ACT inhaler 2 puffs every 4 hours as needed only  if your can't catch your breath Patient taking differently: 2 puffs every 4 hours as needed for wheezing and shortness of breath ((PLAN B)) 04/02/14   Nyoka Cowden, MD  albuterol (PROVENTIL) (2.5 MG/3ML) 0.083% nebulizer solution Take 3 mLs (2.5 mg total) by nebulization every 6 (six) hours as needed for wheezing or  shortness of breath. Patient taking differently: Take 2.5 mg by nebulization every 4 (four) hours as needed for wheezing or shortness of breath (((PLAN C))).  04/02/14   Nyoka CowdenMichael B Wert, MD  amLODipine (NORVASC) 10 MG tablet Take 10 mg by mouth at bedtime. 11/30/15   Historical Provider, MD  aspirin EC 81 MG tablet Take 81 mg by mouth at bedtime.    Historical Provider, MD  baclofen (LIORESAL) 10 MG tablet Take 10 mg by mouth at bedtime as needed for muscle spasms.    Historical Provider, MD  bisacodyl (DULCOLAX) 10 MG suppository Place 1 suppository (10 mg total) rectally daily as needed  for moderate constipation. 12/08/15   Vassie Lollarlos Madera, MD  budesonide-formoterol [facility](SYMBICORT) 160-4.5 MCG/ACT inhaler Inhale 2 puffs into the lungs 2 (two) times daily as needed (shortness of breath/ wheezing).     Historical Provider, MD  Calcium Carbonate-Vitamin D (CALCIUM 600+D) 600-400 MG-UNIT tablet Take 1 tablet by mouth at bedtime.    Historical Provider, MD  ciprofloxacin (CILOXAN) 0.3 % ophthalmic solution Place 1 drop into the left ear every 4 (four) hours while awake. Administer 1 drop, every 2 hours, while awake, for 2 days. Then 1 drop, every 4 hours, while awake, for the next 5 days. 12/08/15   Vassie Lollarlos Madera, MD  citalopram (CELEXA) 20 MG tablet Take 20 mg by mouth at bedtime.  08/12/14   Historical Provider, MD  cloNIDine (CATAPRES) 0.1 MG tablet Take 1 tablet (0.1 mg total) by mouth 2 (two) times daily. 07/13/14   Jeanella CrazeBrandi L Ollis, NP  diclofenac (VOLTAREN) 75 MG EC tablet Take 1 tablet (75 mg total) by mouth 2 (two) times daily. Patient taking differently: Take 75 mg by mouth 2 (two) times daily as needed. Take with food 09/05/15   Dione Boozeavid Glick, MD  docusate sodium (COLACE) 100 MG capsule Take 1 capsule (100 mg total) by mouth 2 (two) times daily. 12/08/15   Vassie Lollarlos Madera, MD  enoxaparin (LOVENOX) 40 MG/0.4ML injection Inject 0.4 mLs (40 mg total) into the skin daily. 12/05/15   Teryl LucyJoshua Landau, MD  ferrous sulfate 325 (65 FE) MG tablet Take 1 tablet (325 mg total) by mouth 3 (three) times daily after meals. 12/08/15   Vassie Lollarlos Madera, MD  gabapentin (NEURONTIN) 300 MG capsule Take 300-600 mg by mouth See admin instructions. Take 1 capsule (300 mg) by mouth every morning and 2 capsules (600 mg) at bedtime 11/18/15   Historical Provider, MD  MAGNESIUM-ZINC PO Take 1 tablet by mouth at bedtime.    Historical Provider, MD  Multiple Vitamin (MULTIVITAMIN WITH MINERALS) TABS Take 1 tablet by mouth at bedtime.     Historical Provider, MD  nicotine (NICODERM CQ - DOSED IN MG/24 HOURS) 21 mg/24hr patch Place 1  patch (21 mg total) onto the skin daily. 12/09/15   Vassie Lollarlos Madera, MD  Omega-3 Fatty Acids (FISH OIL) 1200 MG CAPS Take 2,400 mg by mouth at bedtime.    Historical Provider, MD  oxyCODONE (OXY IR/ROXICODONE) 5 MG immediate release tablet Take 1-2 tablets (5-10 mg total) by mouth every 6 (six) hours as needed for severe pain or breakthrough pain. 12/08/15   Vassie Lollarlos Madera, MD  oxyCODONE (OXYCONTIN) 10 mg 12 hr tablet Take 1 tablet (10 mg total) by mouth every 12 (twelve) hours. 12/08/15   Vassie Lollarlos Madera, MD  OXYGEN Inhale 2 L into the lungs See admin instructions. Wear 2L at bedtime and with activity as needed for shortness of breath     Historical Provider, MD  Phenyleph-Doxylamine-DM-APAP (ALKA SELTZER PLUS PO) Take 1 tablet by mouth 4 (four) times daily as needed (heartburn/ indigestion/ nausea).    Historical Provider, MD  polyethylene glycol (MIRALAX / GLYCOLAX) packet Take 17 g by mouth daily as needed for mild constipation. 07/13/14   Jeanella Craze, NP  POTASSIUM GLUCONATE PO Take 1 tablet by mouth at bedtime.     Historical Provider, MD  pravastatin (PRAVACHOL) 40 MG tablet Take 40 mg by mouth at bedtime.     Historical Provider, MD  ranitidine (ZANTAC) 150 MG tablet Take 150 mg by mouth at bedtime.    Historical Provider, MD  rOPINIRole (REQUIP) 0.25 MG tablet Take 0.5 mg by mouth at bedtime. 12/01/15   Historical Provider, MD  sennosides-docusate sodium (SENOKOT-S) 8.6-50 MG tablet Take 2 tablets by mouth daily. 12/05/15   Teryl Lucy, MD  sodium chloride (OCEAN) 0.65 % SOLN nasal spray Place 1 spray into both nostrils as needed for congestion. Patient taking differently: Place 1 spray into both nostrils every hour as needed for congestion (nasal stuffiness).  07/13/14   Jeanella Craze, NP  zolpidem (AMBIEN) 5 MG tablet Take 1 tablet (5 mg total) by mouth at bedtime as needed for sleep. 12/08/15   Vassie Loll, MD    Family History Family History  Problem Relation Age of Onset  . Emphysema  Sister     smoked  . Heart disease Sister   . Ovarian cancer Sister     Social History Social History  Substance Use Topics  . Smoking status: Current Every Day Smoker    Packs/day: 1.50    Years: 30.00    Types: Cigarettes  . Smokeless tobacco: Never Used  . Alcohol use No     Allergies   Patient has no known allergies.   Review of Systems Review of Systems Cannot obtain review of systems from the patient level V caveat confusion.  Physical Exam Updated Vital Signs BP 136/65   Pulse 93   Temp 98.7 F (37.1 C) (Oral)   Resp 18   Ht 5\' 2"  (1.575 m)   Wt 150 lb (68 kg)   SpO2 95%   BMI 27.44 kg/m   Physical Exam  Constitutional: She appears well-developed and well-nourished.  Patient seems slightly confused. She is resting with her eyes closed. She does not appear to have respiratory distress at rest.  HENT:  Head: Normocephalic and atraumatic.  Right Ear: External ear normal.  Left Ear: External ear normal.  Nose: Nose normal.  Mouth/Throat: Oropharynx is clear and moist.  Eyes: EOM are normal. Pupils are equal, round, and reactive to light.  Neck: Neck supple.  Cardiovascular: Normal rate, regular rhythm, normal heart sounds and intact distal pulses.   Pulmonary/Chest: Effort normal.  Crackles left lung fields. Right lung field has soft and diminished airflow with fine expiratory wheeze.  Abdominal: Soft. She exhibits no distension. There is no tenderness. There is no guarding.  Musculoskeletal: Normal range of motion. She exhibits no edema, tenderness or deformity.  Neurological:  Patient seems fatigued and is somewhat somnolent. She does however over her eyes to command and answer simple questions. Does assist and following commands. There is no focal neurologic deficit.  Skin: Skin is warm and dry.     ED Treatments / Results  Labs (all labs ordered are listed, but only abnormal results are displayed) Labs Reviewed  COMPREHENSIVE METABOLIC PANEL -  Abnormal; Notable for the following:       Result Value  Sodium 125 (*)    Chloride 86 (*)    Glucose, Bld 120 (*)    Creatinine, Ser 0.43 (*)    Calcium 8.8 (*)    Albumin 3.3 (*)    All other components within normal limits  CBC WITH DIFFERENTIAL/PLATELET - Abnormal; Notable for the following:    WBC 19.3 (*)    Neutro Abs 17.1 (*)    Monocytes Absolute 1.4 (*)    All other components within normal limits  URINALYSIS, ROUTINE W REFLEX MICROSCOPIC - Abnormal; Notable for the following:    Ketones, ur 20 (*)    Protein, ur 100 (*)    All other components within normal limits  INFLUENZA PANEL BY PCR (TYPE A & B) - Abnormal; Notable for the following:    Influenza A By PCR POSITIVE (*)    All other components within normal limits  BRAIN NATRIURETIC PEPTIDE - Abnormal; Notable for the following:    B Natriuretic Peptide 428.9 (*)    All other components within normal limits  I-STAT ARTERIAL BLOOD GAS, ED - Abnormal; Notable for the following:    pCO2 arterial 55.4 (*)    pO2, Arterial 79.0 (*)    Bicarbonate 35.0 (*)    Acid-Base Excess 8.0 (*)    All other components within normal limits  CULTURE, BLOOD (ROUTINE X 2)  CULTURE, BLOOD (ROUTINE X 2)  URINE CULTURE  PROTIME-INR  I-STAT CG4 LACTIC ACID, ED  I-STAT TROPOININ, ED  I-STAT CG4 LACTIC ACID, ED    EKG  EKG Interpretation  Date/Time:  Sunday February 27 2016 10:35:52 EST Ventricular Rate:  86 PR Interval:    QRS Duration: 153 QT Interval:  428 QTC Calculation: 512 R Axis:   49 Text Interpretation:  Sinus rhythm Consider left atrial enlargement 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 Coarsened interstitial markings. There also appears to be more focal  opacity in the left infrahilar region, new in the interval. No other interval changes or acute abnormalities. IMPRESSION: 1. Chronic coarsened interstitial markings. 2. Appearing new left perihilar infiltrate worrisome for pneumonia. Recommend follow-up to resolution. Electronically Signed   By: Gerome Sam III M.D   On: 02/27/2016 12:30    Procedures Procedures (including critical care time)  Medications Ordered in ED Medications  ipratropium-albuterol (DUONEB) 0.5-2.5 (3) MG/3ML nebulizer solution 3 mL (not administered)  oseltamivir (TAMIFLU) capsule 75 mg (not administered)  acetaminophen (TYLENOL) tablet 500 mg (not administered)  ipratropium-albuterol (DUONEB) 0.5-2.5 (3) MG/3ML nebulizer solution (3 mLs  Given 02/27/16 1044)  0.9 %  sodium chloride infusion ( Intravenous New Bag/Given 02/27/16 1320)     Initial Impression / Assessment and Plan / ED Course  I have reviewed the triage vital signs and the nursing notes.  Pertinent labs & imaging results that were available during my care of the patient were reviewed by me and considered in my medical decision making (see chart for details).      Final Clinical Impressions(s) / ED Diagnoses   Final diagnoses:  Influenza  Confusion  Chronic obstructive pulmonary disease with acute lower respiratory infection [facility])  Patient admitted for ongoing treatment.  New Prescriptions New Prescriptions   No medications on file  Arby Barrette, MD 03/06/16 [phone]

## 2016-02-27 NOTE — ED Notes (Signed)
Pt took of O2 and sat at end of bed.  O2 re-placed on pt.

## 2016-02-27 NOTE — ED Notes (Signed)
Requested antibiotics from MD

## 2016-02-27 NOTE — H&P (Signed)
Date: 02/27/2016               Patient Name:  [name] MRN: [id number]  DOB: March 26, 1950 Age / Sex: 66 y.o., female   PCP: Elizabeth Palau, FNP         Medical Service: Internal Medicine Teaching Service         Attending Physician: Dr. Inez Catalina, MD    First Contact: Dr. Vincente Liberty Pager: [phone]  Second Contact: Dr. Johnny Bridge Pager: [phone]       After Hours (After 5p/  First Contact Pager: [phone]  weekends / holidays): Second Contact Pager: [phone]   Chief Complaint: confusion  History of Present Illness:  This is a 66 year old F with MHx significant for COPD with CRF and hypercapnia intermittently compliant with 2L O2,  HTN, HLD, chronic hyponatremia and chronic pain on opioid therapy who presents with her family for evaluation of confusion and fevers. Patient is altered and unable to provide much information so history was taken from family and EMR. Patients daughter reports that the pt began feeling sick with cold-like symptoms Monday and Tuesday. Had intermittent confusion during this time however had an acute exacerbation of confusion on Wednesday however was unable to come to ED due to weather conditions. Her cold started improving and her mental status was clearing until this morning where the patient was very "out of her mind." Daughter also reports that her mother has had intermittent urinary incontinence during these episodes of confusion as well. States this has happened once before 06/2014 when her "metabolism was off." When asked for clarification, she stated her electrolytes and metabolism was off due to only consuming coffee and cigarettes.   Chart review shows that she was admitted 07/06/14-07/13/14 with acute metabolic encephalopathy thought to be due to aspiration pneumonia and opioid withdrawal. She quickly deteriorated requiring BiPAP --> precedex --> mechanical ventilation for several days. EEG at that time significant for moderately severe encephalopathy.    In the ED she was afebrile with a temp of 98.7*, pulse 87, BP 153/84, respirations 20 and was saturating 80% on RA. Increased to 95% after started on 2L via [place]. CXR with questionable L perihilar infiltrate concerning for pneumonia. CT head negative for acute process. Influenza A positive. ABG shows baseline hypercarbia. CBC with leukocytosis of 19,300. CMET with chronic hypokalemia (125, ranges from 120-135). BNP elevated at 429. Lactic acid normal. She was subsequently given Duonebs, IVF, Tamiflu and tylenol.   Meds:  Current Meds  Medication Sig  . acetaminophen (TYLENOL) 500 MG tablet Take 500 mg by mouth every 6 (six) hours as needed (pain).  Marland Kitchen albuterol (PROAIR HFA) 108 (90 BASE) MCG/ACT inhaler 2 puffs every 4 hours as needed only  if your can't catch your breath (Patient taking differently: 2 puffs every 4 hours as needed for wheezing and shortness of breath ((PLAN B)))  . albuterol (PROVENTIL) (2.5 MG/3ML) 0.083% nebulizer solution Take 3 mLs (2.5 mg total) by nebulization every 6 (six) hours as needed for wheezing or shortness of breath. (Patient taking differently: Take 2.5 mg by nebulization every 4 (four) hours as needed for wheezing or shortness of breath (((PLAN C))). )  . amLODipine (NORVASC) 10 MG tablet Take 10 mg by mouth at bedtime.  Marland Kitchen amLODipine (NORVASC) 5 MG tablet Take 5 mg by mouth daily.  Marland Kitchen aspirin EC 81 MG tablet Take 81 mg by mouth at bedtime.  . baclofen (LIORESAL) 10 MG tablet Take 10 mg by  mouth at bedtime as needed for muscle spasms.  . budesonide-formoterol (SYMBICORT) 160-4.5 MCG/ACT inhaler Inhale 2 puffs into the lungs 2 (two) times daily as needed (shortness of breath/ wheezing).   . Calcium Carbonate-Vitamin D (CALCIUM 600+D) 600-400 MG-UNIT tablet Take 1 tablet by mouth at bedtime.  . citalopram (CELEXA) 20 MG tablet Take 20 mg by mouth at bedtime.   . cloNIDine (CATAPRES) 0.1 MG tablet Take 1 tablet (0.1 mg total) by mouth 2 (two) times daily.  . diclofenac  (VOLTAREN) 75 MG EC tablet Take 1 tablet (75 mg total) by mouth 2 (two) times daily. (Patient taking differently: Take 75 mg by mouth 2 (two) times daily as needed. Take with food)  . ferrous sulfate 325 (65 FE) MG tablet Take 1 tablet (325 mg total) by mouth 3 (three) times daily after meals. (Patient taking differently: Take 325 mg by mouth daily with breakfast. )  . gabapentin (NEURONTIN) 300 MG capsule Take 300-600 mg by mouth See admin instructions. Take 1 capsule (300 mg) by mouth every morning and 2 capsules (600 mg) at bedtime  . MAGNESIUM-ZINC PO Take 1 tablet by mouth at bedtime.  . Multiple Vitamin (MULTIVITAMIN WITH MINERALS) TABS Take 1 tablet by mouth at bedtime.   Marland Kitchen oxyCODONE (OXYCONTIN) 10 mg 12 hr tablet Take 1 tablet (10 mg total) by mouth every 12 (twelve) hours. (Patient taking differently: Take 15 mg by mouth every 12 (twelve) hours. )  . OXYGEN Inhale 2 L into the lungs See admin instructions. Wear 2L at bedtime and with activity as needed for shortness of breath   . Phenyleph-Doxylamine-DM-APAP (ALKA SELTZER PLUS PO) Take 1 tablet by mouth 4 (four) times daily as needed (heartburn/ indigestion/ nausea).  . potassium chloride (KLOR-CON) 20 MEQ packet Take 1 packet by mouth daily.  . pravastatin (PRAVACHOL) 40 MG tablet Take 40 mg by mouth at bedtime.   . ranitidine (ZANTAC) 150 MG tablet Take 150 mg by mouth at bedtime.  Marland Kitchen rOPINIRole (REQUIP) 0.25 MG tablet Take 0.5 mg by mouth at bedtime.  . sodium chloride (OCEAN) 0.65 % SOLN nasal spray Place 1 spray into both nostrils as needed for congestion. (Patient taking differently: Place 1 spray into both nostrils every hour as needed for congestion (nasal stuffiness). )   Allergies: Allergies as of 02/27/2016  . (No Known Allergies)   Past Medical History:  Diagnosis Date  . Asthma   . Bronchitis   . COPD (chronic obstructive pulmonary disease) (HCC)   . Emphysema   . Encephalopathy 06/2014  . Hip fx, right, closed, initial  encounter (HCC)   . Hyperlipidemia   . Hypertension   . Shortness of breath dyspnea   . UTI (urinary tract infection)    Family History: Sisters with emphysema, heart disease and ovarian cancer.   Social History: Current daily smoker, 1.5 pk/day x 30 years. Family denied any alcohol or recreational drug use. She lives at home with her husband who administers all of the patients medications.   Review of Systems: A complete ROS was negative except as per HPI.  Physical Exam: Blood pressure 156/58, pulse 95, temperature 98.7 F (37.1 C), resp. rate 19, height 5\' 2"  (1.575 m), weight 150 lb (68 kg), SpO2 (!) 87 %. General: Thin, chronically-ill appearing caucasian woman laying in stretcher. Appears uncomfortable and repeats "get me out of here." "aint nobody tell me nothing." and moaning in pain saying her legs hurt.  HENT: PERRL.No conjunctival injection, icterus or ptosis. Oropharynx clear, mucous  membranes moist.  Cardiovascular: Tachycardic but otherwise regular. No murmur or rub appreciated. Pulmonary: Left lung with crackles up to mid-lung. Right lung with basilar crackles. Lungs tight with poor air movement. On 3L O2 via [place] and breathing was unlabored. Kept trying to remove [place] during exam.  Abdomen: Soft, non-tender and non-distended. No guarding or rigidity. +bowel sounds.  Extremities: No peripheral edema noted BL. Intact distal pulses. No gross deformities. Skin: Warm, dry. No cyanosis.  Neuro: Strength and sensation grossly intact.  Psych: Mood agitated and and affect was mood congruent. Did not respond to questions appropriately. Alert, oriented to person and birthday only.  EKG: Sinus rhythm, rate 84. RBBB. Unchanged from prior.  CXR: Chronic coarsened interstitial markings. New left perihilar infiltrate worrisome for pneumonia.  CT Head: No acute intracranial abnormalities. Minimal cerebral atrophy. No acute infarction. Calcifications of carotid siphon.   ECHO 05/2014: LVEF  55-60% with grade 1 diastolic dysfunction  PFTs 2017: FEV1 45% FVC expected 52%, FEV1/FVC 87%  Assessment & Plan by Problem: This is a 66 year old female who presents from home for evaluation of a several day history of fevers, urinary incontinence and confusion. She has significant underlying lung disease and an increased oxygen requirement. +influenza A and CXR concerning for left lobe infiltrate.  Active Problems:   Influenza  Acute on Chronic Respiratory Failure with Hypercarbia, exacerbated by Influenza A and Community Acquired Pneumonia On 2L O2 at home however usually non-compliant per family. Presently afebrile however with reported fevers at home up to 101* over the past week. Husband sick with similar flu-like symptoms and also +Influenza A. CXR concerning for left lobe infiltrate and CBC with leukocytosis of 19.3. In addition, the patient has an increased oxygen requirement.  -Currently saturating well on 3L via [place]. Titrate as needed with goal 88-92%. -Blood cultures obtained -Azithromycin and Ceftriaxone for CAP -Tamiflu for Influenza A -Solumedrol 125 mg once, then Pred 40mg  tomorrow AM -Procalcitonin -NS @ 125 mL/hr -CBC and CMET in am -tele -Symptom management withTylenol & Phenergan   Altered Mental Status: CT head negative for acute process. History of one prior episode 06/2014 which was thought to be due to aspiration pneumonia and opiate withdrawal. Required mechanical ventilation for several days. Pts husband reports he gives her Oxycodone daily at scheduled times and has not missed a dose. Suspect due to underlying infectious process however will be alert for signs of opiate withdrawal. Likely secondary to acute illness however pt has had problems with medication overdose in the past.  -TSH -UA unremarkable, Ucx and Bcx pending  COPD: Husband reports her compliance with Proair, Symbicort however she is rarely compliant with home oxygen and currently smokes 2  pks/day. -Scheduled Duonebs Q4H -Dulera  Influenza A: Tamiflu 75 mg BID -Naproxen 500 mg BID x 2 days  HTN:  Presently normotensive. Husband reports her compliance with Clonidine and amlodipine -Clonidine 0.1 mg BID -Amlodipine 10 mg   Elevated BNP: Last echo in 2016 significant for preserved EF however with grade 1 diastolic dysfunction. Lungs with bibasilar crackles and BNP elevated at 430.  -Daily weights -ECHO  Chronic back pain: On scheduled oxycontin 10 mg BID and reportedly has not missed a dose per husband. Experienced withdrawals during prior hospitalization and will need to have heightened awareness for potential opiate withdrawal.  -Oxycontin 10 mg BID -Oxy IR 5 mg Q6H PRN severe breakthrough pain -Ropinirole 0.25 mg QHS  Chronic Hyponatremia: Currently 125. Ranges from 121-135. Appears to have hypotonic hyponatremia and has  been evaluated in the past when serum sodium 121 on 06/2014: urine osmolality at 331, urine sodium 46 and a normal AM cortisol at that time. Serum osmolality 256. Differential includes SIADH, nephrogenic SIADH or reset osmostat. Interestingly, patients sodium improved with IVF at that time.  ?SIADH  DVT prophylaxis: Heparin IVF: Ns @ 125 mL/hr Diet: HH, carb mod Code Status: FULL  Dispo: Admit patient to Observation with expected length of stay less than 2 midnights.  SignedNoemi Chapel, DO 02/27/2016, 2:39 PM  Pager: [phone]

## 2016-02-27 NOTE — ED Notes (Signed)
Meal tray ordered 

## 2016-02-28 ENCOUNTER — Observation Stay (HOSPITAL_BASED_OUTPATIENT_CLINIC_OR_DEPARTMENT_OTHER): Payer: Medicare Other

## 2016-02-28 DIAGNOSIS — R0600 Dyspnea, unspecified: Secondary | ICD-10-CM | POA: Diagnosis not present

## 2016-02-28 DIAGNOSIS — J1108 Influenza due to unidentified influenza virus with specified pneumonia: Secondary | ICD-10-CM | POA: Diagnosis not present

## 2016-02-28 DIAGNOSIS — J9621 Acute and chronic respiratory failure with hypoxia: Secondary | ICD-10-CM | POA: Diagnosis not present

## 2016-02-28 DIAGNOSIS — J188 Other pneumonia, unspecified organism: Secondary | ICD-10-CM

## 2016-02-28 DIAGNOSIS — J9622 Acute and chronic respiratory failure with hypercapnia: Secondary | ICD-10-CM | POA: Diagnosis not present

## 2016-02-28 DIAGNOSIS — R4182 Altered mental status, unspecified: Secondary | ICD-10-CM

## 2016-02-28 LAB — COMPREHENSIVE METABOLIC PANEL
ALBUMIN: 3 g/dL — AB (ref 3.5–5.0)
ALT: 19 U/L (ref 14–54)
AST: 23 U/L (ref 15–41)
Alkaline Phosphatase: 114 U/L (ref 38–126)
Anion gap: 11 (ref 5–15)
BUN: 6 mg/dL (ref 6–20)
CHLORIDE: 93 mmol/L — AB (ref 101–111)
CO2: 30 mmol/L (ref 22–32)
Calcium: 9.1 mg/dL (ref 8.9–10.3)
Creatinine, Ser: 0.39 mg/dL — ABNORMAL LOW (ref 0.44–1.00)
GFR calc Af Amer: >60 mL/min (ref >60)
GFR calc non Af Amer: >60 mL/min (ref >60)
GLUCOSE: 138 mg/dL — AB (ref 65–99)
POTASSIUM: 3.6 mmol/L (ref 3.5–5.1)
SODIUM: 134 mmol/L — AB (ref 135–145)
Total Bilirubin: 0.5 mg/dL (ref 0.3–1.2)
Total Protein: 6.6 g/dL (ref 6.5–8.1)

## 2016-02-28 LAB — CBC
HCT: 38.7 % (ref 36.0–46.0)
Hemoglobin: 13 g/dL (ref 12.0–15.0)
MCH: 29.3 pg (ref 26.0–34.0)
MCHC: 33.6 g/dL (ref 30.0–36.0)
MCV: 87.2 fL (ref 78.0–100.0)
Platelets: 186 10*3/uL (ref 150–400)
RBC: 4.44 MIL/uL (ref 3.87–5.11)
RDW: 13.9 % (ref 11.5–15.5)
WBC: 11.7 10*3/uL — ABNORMAL HIGH (ref 4.0–10.5)

## 2016-02-28 LAB — URINE CULTURE: CULTURE: NO GROWTH

## 2016-02-28 LAB — STREP PNEUMONIAE URINARY ANTIGEN: STREP PNEUMO URINARY ANTIGEN: NEGATIVE

## 2016-02-28 LAB — ECHOCARDIOGRAM COMPLETE
Height: 62 in
Weight: 2377.6 oz

## 2016-02-28 LAB — GLUCOSE, CAPILLARY: Glucose-Capillary: 136 mg/dL — ABNORMAL HIGH (ref 65–99)

## 2016-02-28 MED ORDER — PREDNISONE 20 MG PO TABS
40.0000 mg | ORAL_TABLET | Freq: Every day | ORAL | Status: DC
  Administered 2016-02-28: 40 mg via ORAL
  Filled 2016-02-28: qty 2

## 2016-02-28 MED ORDER — PREDNISONE 20 MG PO TABS: 40.0000 mg | ORAL_TABLET | Freq: Every day | ORAL | 0 refills | Status: DC

## 2016-02-28 MED ORDER — CEPHALEXIN 500 MG PO CAPS: 500.0000 mg | ORAL_CAPSULE | Freq: Three times a day (TID) | ORAL | 0 refills | Status: DC

## 2016-02-28 MED ORDER — AZITHROMYCIN 250 MG PO TABS: 250.0000 mg | ORAL_TABLET | Freq: Every day | ORAL | 0 refills | Status: DC

## 2016-02-28 MED ORDER — CEPHALEXIN 500 MG PO CAPS
500.0000 mg | ORAL_CAPSULE | Freq: Three times a day (TID) | ORAL | Status: DC
  Administered 2016-02-28: 500 mg via ORAL
  Filled 2016-02-28: qty 1

## 2016-02-28 MED ORDER — PREDNISONE 20 MG PO TABS: 40.0000 mg | ORAL_TABLET | Freq: Every morning | ORAL | Status: DC

## 2016-02-28 MED ORDER — AZITHROMYCIN 500 MG PO TABS
250.0000 mg | ORAL_TABLET | Freq: Every day | ORAL | Status: DC
  Administered 2016-02-28: 250 mg via ORAL
  Filled 2016-02-28: qty 1

## 2016-02-28 MED ORDER — OSELTAMIVIR PHOSPHATE 75 MG PO CAPS: 75.0000 mg | ORAL_CAPSULE | Freq: Two times a day (BID) | ORAL | 0 refills | Status: DC

## 2016-02-28 NOTE — Progress Notes (Signed)
  Echocardiogram 2D Echocardiogram has been performed.  Cathie BeamsGREGORY, Benicio Manna 02/28/2016, 9:49 AM

## 2016-02-28 NOTE — Discharge Instructions (Signed)
Please take your new prescriptions as prescribed. I have sent you home with 2 antibiotics, 1 antiviral and steroids. Please take Azithromycin once daily. Please take Keflex three times daily. Please take Tamiflu twice daily and please take Prednisone once daily. You will be on these medications for 4 more days. PLEASE WERE YOUR OXYGEN CONTINUOUSLY FOR THE NEXT 4 DAYS, EVEN IF YOU FEEL LIKE YOU DON'T NEED IT. In addition, please follow up with your primary care provider in 1 week. Please return to ED if you become confused again.   TAMIFLU MAY BE VERY EXPENSIVE AT YOUR PHARMACY. You do NOT need to fill this medication. You DO need to fill the others.    Influenza, Adult Influenza, more commonly known as the flu, is a viral infection that primarily affects the respiratory tract. The respiratory tract includes organs that help you breathe, such as the lungs, nose, and throat. The flu causes many common cold symptoms, as well as a high fever and body aches. The flu spreads easily from person to person (is contagious). Getting a flu shot (influenza vaccination) every year is the best way to prevent influenza. What are the causes? Influenza is caused by a virus. You can catch the virus by:  Breathing in droplets from an infected person's cough or sneeze.  Touching something that was recently contaminated with the virus and then touching your mouth, nose, or eyes. What increases the risk? The following factors may make you more likely to get the flu:  Not cleaning your hands frequently with soap and water or alcohol-based hand sanitizer.  Having close contact with many people during cold and flu season.  Touching your mouth, eyes, or nose without washing or sanitizing your hands first.  Not drinking enough fluids or not eating a healthy diet.  Not getting enough sleep or exercise.  Being under a high amount of stress.  Not getting a yearly (annual) flu shot. You may be at a higher risk of  complications from the flu, such as a severe lung infection (pneumonia), if you:  Are over the age of 40.  Are pregnant.  Have a weakened disease-fighting system (immune system). You may have a weakened immune system if you:  Have HIV or AIDS.  Are undergoing chemotherapy.  Aretaking medicines that reduce the activity of (suppress) the immune system.  Have a long-term (chronic) illness, such as heart disease, kidney disease, diabetes, or lung disease.  Have a liver disorder.  Are obese.  Have anemia. What are the signs or symptoms? Symptoms of this condition typically last 4-10 days and may include:  Fever.  Chills.  Headache, body aches, or muscle aches.  Sore throat.  Cough.  Runny or congested nose.  Chest discomfort and cough.  Poor appetite.  Weakness or tiredness (fatigue).  Dizziness.  Nausea or vomiting. How is this diagnosed? This condition may be diagnosed based on your medical history and a physical exam. Your health care provider may do a nose or throat swab test to confirm the diagnosis. How is this treated? If influenza is detected early, you can be treated with antiviral medicine that can reduce the length of your illness and the severity of your symptoms. This medicine may be given by mouth (orally) or through an IV tube that is inserted in one of your veins. The goal of treatment is to relieve symptoms by taking care of yourself at home. This may include taking over-the-counter medicines, drinking plenty of fluids, and adding humidity to the  air in your home. In some cases, influenza goes away on its own. Severe influenza or complications from influenza may be treated in a hospital. Follow these instructions at home:  Take over-the-counter and prescription medicines only as told by your health care provider.  Use a cool mist humidifier to add humidity to the air in your home. This can make breathing easier.  Rest as needed.  Drink enough  fluid to keep your urine clear or pale yellow.  Cover your mouth and nose when you cough or sneeze.  Wash your hands with soap and water often, especially after you cough or sneeze. If soap and water are not available, use hand sanitizer.  Stay home from work or school as told by your health care provider. Unless you are visiting your health care provider, try to avoid leaving home until your fever has been gone for 24 hours without the use of medicine.  Keep all follow-up visits as told by your health care provider. This is important. How is this prevented?  Getting an annual flu shot is the best way to avoid getting the flu. You may get the flu shot in late summer, fall, or winter. Ask your health care provider when you should get your flu shot.  Wash your hands often or use hand sanitizer often.  Avoid contact with people who are sick during cold and flu season.  Eat a healthy diet, drink plenty of fluids, get enough sleep, and exercise regularly. Contact a health care provider if:  You develop new symptoms.  You have:  Chest pain.  Diarrhea.  A fever.  Your cough gets worse.  You produce more mucus.  You feel nauseous or you vomit. Get help right away if:  You develop shortness of breath or difficulty breathing.  Your skin or nails turn a bluish color.  You have severe pain or stiffness in your neck.  You develop a sudden headache or sudden pain in your face or ear.  You cannot stop vomiting. This information is not intended to replace advice given to you by your health care provider. Make sure you discuss any questions you have with your health care provider. Document Released: 01/21/2000 Document Revised: 07/01/2015 Document Reviewed: 11/17/2014 Elsevier Interactive Patient Education  2017 [employer] Inc[employer]   Community-Acquired Pneumonia, Adult Introduction Pneumonia is an infection of the lungs. One type of pneumonia can happen while a person is in a hospital.  A different type can happen when a person is not in a hospital (community-acquired pneumonia). It is easy for this kind to spread from person to person. It can spread to you if you breathe near an infected person who coughs or sneezes. Some symptoms include:  A dry cough.  A wet (productive) cough.  Fever.  Sweating.  Chest pain. Follow these instructions at home:  Take over-the-counter and prescription medicines only as told by your doctor.  Only take cough medicine if you are losing sleep.  If you were prescribed an antibiotic medicine, take it as told by your doctor. Do not stop taking the antibiotic even if you start to feel better.  Sleep with your head and neck raised (elevated). You can do this by putting a few pillows under your head, or you can sleep in a recliner.  Do not use tobacco products. These include cigarettes, chewing tobacco, and e-cigarettes. If you need help quitting, ask your doctor.  Drink enough water to keep your pee (urine) clear or pale yellow. A shot (vaccine)  can help prevent pneumonia. Shots are often suggested for:  People older than 68 years of age.  People older than 68 years of age:  Who are having cancer treatment.  Who have long-term (chronic) lung disease.  Who have problems with their body's defense system (immune system). You may also prevent pneumonia if you take these actions:  Get the flu (influenza) shot every year.  Go to the dentist as often as told.  Wash your hands often. If soap and water are not available, use hand sanitizer. Contact a doctor if:  You have a fever.  You lose sleep because your cough medicine does not help. Get help right away if:  You are short of breath and it gets worse.  You have more chest pain.  Your sickness gets worse. This is very serious if:  You are an older adult.  Your body's defense system is weak.  You cough up blood. This information is not intended to replace advice given to  you by your health care provider. Make sure you discuss any questions you have with your health care provider. Document Released: 07/12/2007 Document Revised: 07/01/2015 Document Reviewed: 05/20/2014  2017 Elsevier

## 2016-02-28 NOTE — Progress Notes (Signed)
[name] to be D/C'd to home with home health PT per MD order.  Discussed with the patient and all questions fully answered.  VSS, Skin clean, dry and intact without evidence of skin break down, no evidence of skin tears noted. IV catheter discontinued intact. Site without signs and symptoms of complications. Dressing and pressure applied.  An After Visit Summary was printed and given to the patient. Patient received prescriptions.  D/c education completed with patient/family including follow up instructions, medication list, d/c activities limitations if indicated, with other d/c instructions as indicated by MD - patient able to verbalize understanding, all questions fully answered.   Patient instructed to return to ED, call 911, or call MD for any changes in condition.   Patient escorted via WC, and D/C home via private auto.  [name] 02/28/2016 5:02 PM

## 2016-02-28 NOTE — Care Management Note (Signed)
Case Management Note  Patient Details  Name: Dustin FolksRebecca E Doleman MRN: [id number] Date of Birth: August 30, 1950  Subjective/Objective:     Admitted with PNA.         Action/Plan: Plan is to d/c to home today with home health services (PT).  Expected Discharge Date:  02/28/16               Expected Discharge Plan:  Home w Home Health Services  In-House Referral:     Discharge planning Services  CM Consult  Post Acute Care Choice:    Choice offered to:  Patient  DME Arranged:    DME Agency:     HH Arranged:  PT HH Agency:  [facility] ([facility] @ [phone].  Status of Service:  Completed, signed off  If discussed at Long Length of Stay Meetings, dates discussed:    Additional Comments:  Epifanio LeschesCole, Corvette Orser Hudson, RN 02/28/2016, 4:06 PM

## 2016-02-28 NOTE — Progress Notes (Signed)
  Date: 02/28/2016  Patient name: [name]  Medical record number: [id number]  Date of birth: 03/22/1950   I have seen and evaluated Daisy Folksebecca E Prosise and discussed their care with the Residency Team. Briefly, [name] is a woman with PMH of COPD, chronic respiratory failure with hypercapnia supposed to be on continuous O2 but only using intermittently, HTN, HLD, chronic low Na who presented for fevers and confusion.  She had apparently been quite ill for a about a week, but family was not able to get her out due to inclement weather last week.  She got worse again on the day of admission and was brought to the ED. She had hypoxia which improved with oxygen.  CXR showed a possible infiltrate and flu swab was positive.    PMHx, Fam Hx, and/or Soc Hx : She was admitted about 1.5 years ago with metabolic encephalopathy thought to be due to aspiration pneumonia, she has a FH of emphysema and heart disease.  She is a current every day smoker.    Vitals:   02/28/16 0913 02/28/16 1331  BP: (!) 174/86 (!) 151/70  Pulse:  92  Resp:  18  Temp:  97.7 F (36.5 C)   Physical Exam:  Gen: Much more alert today, answering all questions appropriately, oriented.  No pain described Eyes: no sclerae icterus, no conjunctival injection HENT: Bell in place, NCAT CV: RR, NR, no murmur Pulm: Crackles in left lung base and left midlung, otherwise decreased breath sounds throughout, no wheezing Abd: Soft, +BS Ext: No rash or wound, no edema  EKG: RBBB, unchanged CXR: Left perihilar infiltrate CT head: No acute issues  Assessment and Plan: I have seen and evaluated the patient as outlined above. I agree with the formulated Assessment and Plan as detailed in the residents' note, with the following changes:   1. Influenza with secondary pneumonia - Encourage [place] oxygen use - BC X 2 pending - Treating for influenza as well as CAP with tamiflu, azithromycin and rocephin.  Rocephin can be changed to oral  medication today - Urinary strep antigen should be sent - Steroids for presumed COPD exacerbation - NS at 125cc/hr overnight  2. AMS - Likely due to above, has improved rapidly with treatment of infection - TSH normal - UC, BC pending  Other issues per Dr. Lana FishMolt's note.  Given her rapid improvement, she should go home in next day or so.   Inez CatalinaEmily B Mullen, MD 1/22/20183:48 PM

## 2016-02-28 NOTE — Evaluation (Signed)
Physical Therapy Evaluation Patient Details Name: Daisy FolksRebecca E Duthie MRN: [id number] DOB: 1950/09/27 Today's Date: 02/28/2016   History of Present Illness  66 year old F with MHx significant for COPD with CRF and hypercapnia intermittently compliant with 2L O2,  HTN, HLD, chronic hyponatremia and chronic pain on opioid therapy who presents with her family for evaluation of confusion and fevers  Clinical Impression  Pt presents with impairments in strength, gait, balance and mobility and will benefit from skilled PT to address deficits and improve functional mobility    Follow Up Recommendations Home health PT    Equipment Recommendations  None recommended by PT    Recommendations for Other Services       Precautions / Restrictions Precautions Precautions: Fall Restrictions Weight Bearing Restrictions: No      Mobility  Bed Mobility Overal bed mobility: Independent                Transfers Overall transfer level: Needs assistance Equipment used: None Transfers: Sit to/from Stand Sit to Stand: Min assist         General transfer comment: pt requires min A for sit to stand from toilet and bed  Ambulation/Gait Ambulation/Gait assistance: Min assist Ambulation Distance (Feet): 15 Feet Assistive device: 1 person hand held assist       General Gait Details: Pt with antalgic gait due to onset of Rt LE pain with wt bearing  Stairs            Wheelchair Mobility    Modified Rankin (Stroke Patients Only)       Balance Overall balance assessment:  (pt requires supervision for static standing balance)                                           Pertinent Vitals/Pain Pain Assessment: Faces Faces Pain Scale: Hurts little more Pain Location: Rt LE Pain Descriptors / Indicators: Aching Pain Intervention(s): Limited activity within patient's tolerance;Monitored during session;RN gave pain meds during session    Home Living Family/patient  expects to be discharged to:: Private residence Living Arrangements: Spouse/significant other Available Help at Discharge: Family;Available PRN/intermittently Type of Home: House Home Access: Stairs to enter   Entrance Stairs-Number of Steps: 1 Home Layout: One level;Able to live on main level with bedroom/bathroom Home Equipment: Walker - standard;Cane - single point;Other (comment);Hand held shower head;Walker - 4 wheels;Bedside commode      Prior Function Level of Independence: Independent with assistive device(s)         Comments: uses RW at home     Hand Dominance        Extremity/Trunk Assessment   Upper Extremity Assessment Upper Extremity Assessment: Overall WFL for tasks assessed    Lower Extremity Assessment Lower Extremity Assessment: Overall WFL for tasks assessed       Communication   Communication: No difficulties  Cognition Arousal/Alertness: Awake/alert Behavior During Therapy: WFL for tasks assessed/performed Overall Cognitive Status: Within Functional Limits for tasks assessed                      General Comments      Exercises     Assessment/Plan    PT Assessment Patient needs continued PT services  PT Problem List Decreased strength;Decreased balance;Decreased mobility;Decreased activity tolerance;Cardiopulmonary status limiting activity;Pain;Decreased knowledge of use of DME          PT  Treatment Interventions DME instruction;Functional mobility training;Balance training;Patient/family education;Gait training;Stair training;Therapeutic exercise;Therapeutic activities;Neuromuscular re-education;Modalities;Manual techniques    PT Goals (Current goals can be found in the Care Plan section)  Acute Rehab PT Goals Patient Stated Goal: go home PT Goal Formulation: With patient Time For Goal Achievement: 03/06/16 Potential to Achieve Goals: Good    Frequency Min 3X/week   Barriers to discharge        Co-evaluation                End of Session   Activity Tolerance: Patient tolerated treatment well Patient left: in bed;with call bell/phone within reach;with nursing/sitter in room Nurse Communication: Mobility status    Functional Assessment Tool Used: clinical judgement Functional Limitation: Mobility: Walking and moving around Mobility: Walking and Moving Around Current Status (X9147): At least 20 percent but less than 40 percent impaired, limited or restricted Mobility: Walking and Moving Around Goal Status [phone]): At least 1 percent but less than 20 percent impaired, limited or restricted    Time: 1000-1010 PT Time Calculation (min) (ACUTE ONLY): 10 min   Charges:         PT G Codes:   PT G-Codes **NOT FOR INPATIENT CLASS** Functional Assessment Tool Used: clinical judgement Functional Limitation: Mobility: Walking and moving around Mobility: Walking and Moving Around Current Status (O1308): At least 20 percent but less than 40 percent impaired, limited or restricted Mobility: Walking and Moving Around Goal Status [phone]): At least 1 percent but less than 20 percent impaired, limited or restricted    [facility] 02/28/2016, 11:35 AM

## 2016-02-28 NOTE — Care Management Obs Status (Signed)
MEDICARE OBSERVATION STATUS NOTIFICATION   Patient Details  Name: Daisy FolksRebecca E Hedglin MRN: [id number] Date of Birth: February 06, 1951   Medicare Observation Status Notification Given:  Yes    Epifanio LeschesCole, Herold Salguero Hudson, RN 02/28/2016, 4:48 PM

## 2016-02-28 NOTE — Progress Notes (Signed)
   Subjective: [name] was seen and evaluated at bedside. Pt reports she is feeling much better since admission. Myalgias have resolved and she denied any fevers or chills. No acute events overnight.  Objective:  Vital signs in last 24 hours: Vitals:   02/27/16 2234 02/28/16 0439 02/28/16 0547 02/28/16 0913  BP:   (!) 154/85 (!) 174/86  Pulse:   (!) 102   Resp:   18   Temp:   98.2 F (36.8 C)   TempSrc:      SpO2: 96%  96%   Weight:  148 lb 9.6 oz (67.4 kg)    Height:       General: Chronically-ill appearing however In no acute distress. Resting comfortably in bed. Non-toxic. HENT: EOMI. No conjunctival injection, icterus or ptosis.   Cardiovascular: Regular rate and rhythm. No murmur or rub appreciated. Pulmonary: Crackles left lung. Right lung clear. Good air movement. Unlabored breathing. On 3L via [place] Abdomen: Soft, non-tender and non-distended. +bowel sounds.  Extremities: No peripheral edema noted BL. Intact distal pulses. Skin: Warm, dry. No cyanosis.  Psych: Alert and oriented x3. Mood normal and affect was mood congruent. Responds to questions appropriately.   Assessment/Plan:  Principal Problem:   Acute on chronic respiratory failure with hypoxia and hypercapnia (HCC) Active Problems:   COPD GOLD III/ still smoking    Cigarette smoker   Hyponatremia   Acute encephalopathy   Chronic pain syndrome   Influenza  Acute on Chronic Respiratory Failure with Hypercarbia, exacerbated by Influenza A and Community Acquired Pneumonia Doing much better today. Denied any fevers, chills or myalgias. She has been afebrile since admission and leukocytosis is improving (11.7 today). Procalcitonin elevated at .27 which aligns with diagnosis of CAP. Will transition to oral Abx today and likely discharge tomorrow if still improving clinically. -IV -> PO Abx (Azithromycin + Keflex) -Tamiflu 75 mg BID -Naproxen 500 mg BID -Scheduled duonebs + PRN albuterol -Prednisone 40 mg  daily -Blood cultures pending -Will obtain urine Strep antigen to help narrow abx on d/c. -Will ambulate patient with home O2 tomorrow  COPD: As above. Dulera  Influenza: As above  HTN: Clonidine 0.1mg  BID, Amlodipine 10 mg   Elevated BNP ECHO performed - results pending. Euvolemic on exam.  Chronic Back Pain: Controlled on current regimen. Will continue home Oxy 10 mg BID + Oxy 5 for breakthrough pain  Chronic Hyponatremia: Resolved today with IVF. Likely tea and toast related.  Dispo: Anticipated discharge tomorrow.   Chayne Baumgart, DO 02/28/2016, 10:27 AM Pager: [phone]

## 2016-03-03 LAB — CULTURE, BLOOD (ROUTINE X 2)
Culture: NO GROWTH
Culture: NO GROWTH

## 2016-03-03 NOTE — Discharge Summary (Signed)
Name: [name] MRN: [id number] DOB: Jun 20, 1950 66 y.o. PCP: Elizabeth Palau, FNP  Date of Admission: 02/27/2016 10:24 AM Date of Discharge: 03/03/2016 Attending Physician: No att. providers found  Discharge Diagnosis: 1. Altered Mental Status 2. Acute on Chronic Respiratory Failure with Hypoxia and Hypercapnia  Principal Problem:   Acute on chronic respiratory failure with hypoxia and hypercapnia (HCC) Active Problems:   COPD GOLD III/ still smoking    Cigarette smoker   Hyponatremia   Acute encephalopathy   Chronic pain syndrome   Influenza   Discharge Medications: Allergies as of 02/28/2016   No Known Allergies     Medication List    TAKE these medications   acetaminophen 500 MG tablet Commonly known as:  TYLENOL Take 500 mg by mouth every 6 (six) hours as needed (pain).   albuterol (2.5 MG/3ML) 0.083% nebulizer solution Commonly known as:  PROVENTIL Take 3 mLs (2.5 mg total) by nebulization every 6 (six) hours as needed for wheezing or shortness of breath. What changed:  when to take this  reasons to take this   albuterol 108 (90 Base) MCG/ACT inhaler Commonly known as:  PROAIR HFA 2 puffs every 4 hours as needed only  if your can't catch your breath What changed:  additional instructions   ALKA SELTZER PLUS PO Take 1 tablet by mouth 4 (four) times daily as needed (heartburn/ indigestion/ nausea).   amLODipine 5 MG tablet Commonly known as:  NORVASC Take 5 mg by mouth daily.   aspirin EC 81 MG tablet Take 81 mg by mouth at bedtime.   azithromycin 250 MG tablet Commonly known as:  ZITHROMAX Take 1 tablet (250 mg total) by mouth daily.   baclofen 10 MG tablet Commonly known as:  LIORESAL Take 10 mg by mouth at bedtime as needed for muscle spasms.   budesonide-formoterol 160-4.5 MCG/ACT inhaler Commonly known as:  SYMBICORT Inhale 2 puffs into the lungs 2 (two) times daily as needed (shortness of breath/ wheezing).   CALCIUM 600+D 600-400  MG-UNIT tablet Generic drug:  Calcium Carbonate-Vitamin D Take 1 tablet by mouth at bedtime.   cephALEXin 500 MG capsule Commonly known as:  KEFLEX Take 1 capsule (500 mg total) by mouth 3 (three) times daily.   citalopram 20 MG tablet Commonly known as:  CELEXA Take 20 mg by mouth at bedtime.   cloNIDine 0.1 MG tablet Commonly known as:  CATAPRES Take 1 tablet (0.1 mg total) by mouth 2 (two) times daily.   diclofenac 75 MG EC tablet Commonly known as:  VOLTAREN Take 1 tablet (75 mg total) by mouth 2 (two) times daily. What changed:  when to take this  reasons to take this  additional instructions   ferrous sulfate 325 (65 FE) MG tablet Take 1 tablet (325 mg total) by mouth 3 (three) times daily after meals. What changed:  when to take this   gabapentin 300 MG capsule Commonly known as:  NEURONTIN Take 300-600 mg by mouth See admin instructions. Take 1 capsule (300 mg) by mouth every morning and 2 capsules (600 mg) at bedtime   MAGNESIUM-ZINC PO Take 1 tablet by mouth at bedtime.   multivitamin with minerals Tabs tablet Take 1 tablet by mouth at bedtime.   oseltamivir 75 MG capsule Commonly known as:  TAMIFLU Take 1 capsule (75 mg total) by mouth 2 (two) times daily.   oxyCODONE 10 mg 12 hr tablet Commonly known as:  OXYCONTIN Take 1 tablet (10 mg total) by mouth every  12 (twelve) hours. What changed:  how much to take   OXYGEN Inhale 2 L into the lungs See admin instructions. Wear 2L at bedtime and with activity as needed for shortness of breath   potassium chloride 20 MEQ packet Commonly known as:  KLOR-CON Take 1 packet by mouth daily.   pravastatin 40 MG tablet Commonly known as:  PRAVACHOL Take 40 mg by mouth at bedtime.   predniSONE 20 MG tablet Commonly known as:  DELTASONE Take 2 tablets (40 mg total) by mouth daily with breakfast.   psyllium 95 % Pack Commonly known as:  HYDROCIL/METAMUCIL Take 1 packet by mouth daily.   ranitidine 150 MG  tablet Commonly known as:  ZANTAC Take 150 mg by mouth at bedtime.   rOPINIRole 0.25 MG tablet Commonly known as:  REQUIP Take 0.5 mg by mouth at bedtime.   sodium chloride 0.65 % Soln nasal spray Commonly known as:  OCEAN Place 1 spray into both nostrils as needed for congestion. What changed:  when to take this  reasons to take this       Disposition and follow-up:   Ms.Vondra E Delton Seeelson [facility].  At the hospital follow up visit please address:  1.  Altered Mental Status: Still at baseline? CAP: Has she completed her course of antibiotics  Influenza A: Has she completed tamiflu  2.  Labs / imaging needed at time of follow-up: None.  3.  Pending labs/ test needing follow-up: None.  Follow-up Appointments: Follow-up Information    [facility] Follow up.   Specialty:  Home Health Services Why:  home health PT arranged, ofice to call and set up home visits Contact information: [address] [place] [address] [phone]          Hospital Course by problem list: Principal Problem:   Acute on chronic respiratory failure with hypoxia and hypercapnia (HCC) Active Problems:   COPD GOLD III/ still smoking    Cigarette smoker   Hyponatremia   Acute encephalopathy   Chronic pain syndrome   Influenza   1. Altered Mental Status, Acute on Chronic Respiratory Failure This is a 66 year old F with MHx significant for COPD with CRF and hypercapnia intermittently compliant with 2L O2,  HTN, HLD, chronic hyponatremia and chronic pain on opioid therapy who presents with her family for evaluation of confusion and fevers. Patients daughter reports that the pt began feeling sick with cold-like symptoms Monday and Tuesday. Had intermittent confusion during this time however had an acute exacerbation of confusion on Wednesday however was unable to come to ED due to weather conditions. Her cold started  improving and her mental status was clearing until the morning of admission she was "out of her mind." Patient tested positive for Influenza A and CXR suspicious for CAP. She was subsequently treated with Tamiflu, IV Zosyn, prednisone, scheduled duonebs and Naproxen with significant improvement of her symptoms. By the following morning, she had nearly returned to baseline. She was subsequently transitioned to oral antibiotics and discharged home with Rx for Tamiflu, Abx and instructions for strict return.  Discharge Vitals:   BP (!) 151/70 (BP Location: Left Arm)   Pulse 92   Temp 97.7 F (36.5 C) (Oral)   Resp 18   Ht 5\' 2"  (1.575 m)   Wt 148 lb 9.6 oz (67.4 kg)   SpO2 92%   BMI 27.18 kg/m   Pertinent Labs, Studies, and  Procedures:  CXR: Left perihilar infiltrate concerning for pneumonia CT Head: Negative for acute abnormality Influenza: +Influenza A UA: Negative for infection Procalcitonin 0.27 CBC: initial leukocytosis at 19,000. 11,700 day of discharge.  Discharge Instructions: Please complete your prescribed course of Tamiflu, Azithromycin and Keflex. Please follow up with your primary care physician within 1-2 weeks of discharge. In addition, please return to ED if your symptoms should return.   SignedNoemi Chapel, DO 03/03/2016, 4:10 PM   Pager: [phone]

## 2016-06-21 ENCOUNTER — Encounter (HOSPITAL_COMMUNITY): Payer: Self-pay | Admitting: Emergency Medicine

## 2016-06-21 DIAGNOSIS — J449 Chronic obstructive pulmonary disease, unspecified: Secondary | ICD-10-CM | POA: Insufficient documentation

## 2016-06-21 DIAGNOSIS — I10 Essential (primary) hypertension: Secondary | ICD-10-CM | POA: Diagnosis not present

## 2016-06-21 DIAGNOSIS — F17210 Nicotine dependence, cigarettes, uncomplicated: Secondary | ICD-10-CM | POA: Insufficient documentation

## 2016-06-21 DIAGNOSIS — Z7982 Long term (current) use of aspirin: Secondary | ICD-10-CM | POA: Diagnosis not present

## 2016-06-21 DIAGNOSIS — Z96641 Presence of right artificial hip joint: Secondary | ICD-10-CM | POA: Insufficient documentation

## 2016-06-21 DIAGNOSIS — M6281 Muscle weakness (generalized): Secondary | ICD-10-CM | POA: Insufficient documentation

## 2016-06-21 DIAGNOSIS — Z5321 Procedure and treatment not carried out due to patient leaving prior to being seen by health care provider: Secondary | ICD-10-CM | POA: Diagnosis not present

## 2016-06-21 DIAGNOSIS — Z79899 Other long term (current) drug therapy: Secondary | ICD-10-CM | POA: Diagnosis not present

## 2016-06-21 LAB — CBC
HCT: 39.6 % (ref 36.0–46.0)
Hemoglobin: 13 g/dL (ref 12.0–15.0)
MCH: 30 pg (ref 26.0–34.0)
MCHC: 32.8 g/dL (ref 30.0–36.0)
MCV: 91.2 fL (ref 78.0–100.0)
PLATELETS: 251 10*3/uL (ref 150–400)
RBC: 4.34 MIL/uL (ref 3.87–5.11)
RDW: 13 % (ref 11.5–15.5)
WBC: 11.2 10*3/uL — AB (ref 4.0–10.5)

## 2016-06-21 NOTE — ED Triage Notes (Signed)
Pt reports generalized weakness and "feeling bad" since yesterday.  Pt reports no n/v/d, denies fever or LOC.  States she has felt "jittery".

## 2016-06-22 ENCOUNTER — Emergency Department (HOSPITAL_COMMUNITY)
Admission: EM | Admit: 2016-06-22 | Discharge: 2016-06-22 | Disposition: A | Discharge status: Home / Self Care | Payer: Medicare Other | Attending: Emergency Medicine | Admitting: Emergency Medicine

## 2016-06-22 LAB — BASIC METABOLIC PANEL
ANION GAP: 7 (ref 5–15)
BUN: 9 mg/dL (ref 6–20)
CHLORIDE: 98 mmol/L — AB (ref 101–111)
CO2: 28 mmol/L (ref 22–32)
Calcium: 9 mg/dL (ref 8.9–10.3)
Creatinine, Ser: 0.63 mg/dL (ref 0.44–1.00)
GFR calc non Af Amer: >60 mL/min (ref >60)
Glucose, Bld: 100 mg/dL — ABNORMAL HIGH (ref 65–99)
POTASSIUM: 3.8 mmol/L (ref 3.5–5.1)
SODIUM: 133 mmol/L — AB (ref 135–145)

## 2016-06-22 LAB — URINALYSIS, ROUTINE W REFLEX MICROSCOPIC
Bilirubin Urine: NEGATIVE
GLUCOSE, UA: NEGATIVE mg/dL
HGB URINE DIPSTICK: NEGATIVE
Ketones, ur: NEGATIVE mg/dL
LEUKOCYTES UA: NEGATIVE
Nitrite: NEGATIVE
Protein, ur: NEGATIVE mg/dL
SPECIFIC GRAVITY, URINE: 1.003 — AB (ref 1.005–1.030)
pH: 8 (ref 5.0–8.0)

## 2016-07-07 ENCOUNTER — Emergency Department (HOSPITAL_BASED_OUTPATIENT_CLINIC_OR_DEPARTMENT_OTHER)
Admission: EM | Admit: 2016-07-07 | Discharge: 2016-07-07 | Disposition: A | Discharge status: Home / Self Care | Payer: Medicare Other | Attending: Emergency Medicine | Admitting: Emergency Medicine

## 2016-07-07 ENCOUNTER — Encounter (HOSPITAL_BASED_OUTPATIENT_CLINIC_OR_DEPARTMENT_OTHER): Payer: Self-pay | Admitting: *Deleted

## 2016-07-07 DIAGNOSIS — M791 Myalgia: Secondary | ICD-10-CM | POA: Diagnosis not present

## 2016-07-07 DIAGNOSIS — Z7982 Long term (current) use of aspirin: Secondary | ICD-10-CM | POA: Diagnosis not present

## 2016-07-07 DIAGNOSIS — I10 Essential (primary) hypertension: Secondary | ICD-10-CM | POA: Diagnosis not present

## 2016-07-07 DIAGNOSIS — Z79899 Other long term (current) drug therapy: Secondary | ICD-10-CM | POA: Insufficient documentation

## 2016-07-07 DIAGNOSIS — F17210 Nicotine dependence, cigarettes, uncomplicated: Secondary | ICD-10-CM | POA: Diagnosis not present

## 2016-07-07 DIAGNOSIS — J45909 Unspecified asthma, uncomplicated: Secondary | ICD-10-CM | POA: Diagnosis not present

## 2016-07-07 DIAGNOSIS — R350 Frequency of micturition: Secondary | ICD-10-CM | POA: Insufficient documentation

## 2016-07-07 DIAGNOSIS — R300 Dysuria: Secondary | ICD-10-CM | POA: Diagnosis present

## 2016-07-07 DIAGNOSIS — J449 Chronic obstructive pulmonary disease, unspecified: Secondary | ICD-10-CM | POA: Diagnosis not present

## 2016-07-07 DIAGNOSIS — R3915 Urgency of urination: Secondary | ICD-10-CM | POA: Insufficient documentation

## 2016-07-07 LAB — URINALYSIS, ROUTINE W REFLEX MICROSCOPIC
Bilirubin Urine: NEGATIVE
GLUCOSE, UA: NEGATIVE mg/dL
Hgb urine dipstick: NEGATIVE
KETONES UR: NEGATIVE mg/dL
LEUKOCYTES UA: NEGATIVE
NITRITE: NEGATIVE
PROTEIN: NEGATIVE mg/dL
Specific Gravity, Urine: 1.003 — ABNORMAL LOW (ref 1.005–1.030)
pH: 7.5 (ref 5.0–8.0)

## 2016-07-07 MED ORDER — CEPHALEXIN 250 MG PO CAPS
500.0000 mg | ORAL_CAPSULE | Freq: Once | ORAL | Status: AC
  Administered 2016-07-07: 500 mg via ORAL
  Filled 2016-07-07: qty 2

## 2016-07-07 MED ORDER — PHENAZOPYRIDINE HCL 100 MG PO TABS: 100.0000 mg | ORAL_TABLET | Freq: Two times a day (BID) | ORAL | 0 refills | Status: DC

## 2016-07-07 MED ORDER — CEPHALEXIN 500 MG PO CAPS: 500.0000 mg | ORAL_CAPSULE | Freq: Two times a day (BID) | ORAL | 0 refills | Status: DC

## 2016-07-07 MED ORDER — PHENAZOPYRIDINE HCL 100 MG PO TABS
95.0000 mg | ORAL_TABLET | Freq: Once | ORAL | Status: AC
  Administered 2016-07-07: 100 mg via ORAL
  Filled 2016-07-07: qty 1

## 2016-07-07 NOTE — ED Provider Notes (Signed)
[facility]   CSN: [id number] Arrival date & time: 07/07/16  0307     History   Chief Complaint Chief Complaint  Patient presents with  . Dysuria    HPI KEYONTA MADRID is a 66 y.o. female.  The history is provided by the patient.  Dysuria   This is a new problem. The current episode started yesterday. The problem occurs every urination. The problem has been gradually worsening. The quality of the pain is described as burning. The pain is moderate. There has been no fever. Associated symptoms include frequency and urgency. Pertinent negatives include no chills, no vomiting and no flank pain. She has tried nothing for the symptoms.  pt with h/o COPD, chronic back pain presents with dysuria She reports frequent urge to urinate and reports she is passing adequate volume but it burns when she urinates No vaginal symptoms reported No fever/vomiting No incontinence reported She reports mild pain over bladder She also reports muscle aches   Past Medical History:  Diagnosis Date  . Asthma   . Bronchitis   . COPD (chronic obstructive pulmonary disease) (HCC)   . Emphysema   . Encephalopathy 06/2014  . Hip fx, right, closed, initial encounter (HCC)   . Hyperlipidemia   . Hypertension   . Shortness of breath dyspnea   . UTI (urinary tract infection)     Patient Active Problem List   Diagnosis Date Noted  . Acute on chronic respiratory failure with hypoxia and hypercapnia (HCC) 02/28/2016  . Influenza 02/27/2016  . S/P ORIF (open reduction internal fixation) fracture   . Chronic pain syndrome   . Acute blood loss anemia   . Hip fx, right, closed, initial encounter (HCC) 12/05/2015  . Pressure ulcer 07/07/2014  . Altered mental state   . Change in mental state   . Fever   . Acute encephalopathy 07/06/2014  . Narcotic overdose   . Hyponatremia 05/31/2014  . Lethargic 05/31/2014  . Chronic pain 05/31/2014  . Sepsis (HCC) 05/31/2014  . COPD GOLD  III/ still smoking  04/02/2014  . Chronic respiratory failure with hypercapnia (HCC) 04/02/2014  . Cigarette smoker 04/02/2014  . COPD exacerbation (HCC) 02/12/2012  . Essential hypertension 02/12/2012  . Tachycardia 02/12/2012    Past Surgical History:  Procedure Laterality Date  . ABDOMINAL HYSTERECTOMY    . APPENDECTOMY    . BACK SURGERY    . HIP ARTHROPLASTY Right 12/05/2015   Procedure: ARTHROPLASTY BIPOLAR HIP (HEMIARTHROPLASTY);  Surgeon: Teryl Lucy, MD;  Location: [facility] OR;  Service: Orthopedics;  Laterality: Right;  . INSERTION / PLACEMENT / REVISION NEUROSTIMULATOR    . removal of neurostimulator  06/2014    OB History    No data available       Home Medications    Prior to Admission medications   Medication Sig Start Date End Date Taking? Authorizing Provider  acetaminophen (TYLENOL) 500 MG tablet Take 500 mg by mouth every 6 (six) hours as needed (pain).    [provider]  albuterol (PROAIR HFA) 108 (90 BASE) MCG/ACT inhaler 2 puffs every 4 hours as needed only  if your can't catch your breath Patient taking differently: 2 puffs every 4 hours as needed for wheezing and shortness of breath ((PLAN B)) 04/02/14   Nyoka Cowden, MD  albuterol (PROVENTIL) (2.5 MG/3ML) 0.083% nebulizer solution Take 3 mLs (2.5 mg total) by nebulization every 6 (six) hours as needed for wheezing or shortness of breath. Patient taking differently:  Take 2.5 mg by nebulization every 4 (four) hours as needed for wheezing or shortness of breath (((PLAN C))).  04/02/14   Nyoka CowdenWert, Michael B, MD  amLODipine (NORVASC) 5 MG tablet Take 5 mg by mouth daily. 02/09/16   [provider]  aspirin EC 81 MG tablet Take 81 mg by mouth at bedtime.    [provider]  baclofen (LIORESAL) 10 MG tablet Take 10 mg by mouth at bedtime as needed for muscle spasms.    [provider]  budesonide-formoterol (SYMBICORT) 160-4.5 MCG/ACT inhaler Inhale 2 puffs into the lungs 2 (two) times  daily as needed (shortness of breath/ wheezing).     [provider]  citalopram (CELEXA) 20 MG tablet Take 20 mg by mouth at bedtime.  08/12/14   [provider]  cloNIDine (CATAPRES) 0.1 MG tablet Take 1 tablet (0.1 mg total) by mouth 2 (two) times daily. 07/13/14   Jeanella Crazellis, Brandi L, NP  diclofenac (VOLTAREN) 75 MG EC tablet Take 1 tablet (75 mg total) by mouth 2 (two) times daily. Patient taking differently: Take 75 mg by mouth 2 (two) times daily as needed. Take with food 09/05/15   Dione BoozeGlick, David, MD  ferrous sulfate 325 (65 FE) MG tablet Take 1 tablet (325 mg total) by mouth 3 (three) times daily after meals. Patient taking differently: Take 325 mg by mouth daily with breakfast.  12/08/15   Vassie LollMadera, Carlos, MD  gabapentin (NEURONTIN) 300 MG capsule Take 300-600 mg by mouth See admin instructions. Take 1 capsule (300 mg) by mouth every morning and 2 capsules (600 mg) at bedtime 11/18/15   [provider]  MAGNESIUM-ZINC PO Take 1 tablet by mouth at bedtime.    [provider]  Multiple Vitamin (MULTIVITAMIN WITH MINERALS) TABS Take 1 tablet by mouth at bedtime.     [provider]  oxyCODONE (OXYCONTIN) 10 mg 12 hr tablet Take 1 tablet (10 mg total) by mouth every 12 (twelve) hours. Patient taking differently: Take 15 mg by mouth every 12 (twelve) hours.  12/08/15   Vassie LollMadera, Carlos, MD  OXYGEN Inhale 2 L into the lungs See admin instructions. Wear 2L at bedtime and with activity as needed for shortness of breath     [provider]  Phenyleph-Doxylamine-DM-APAP (ALKA SELTZER PLUS PO) Take 1 tablet by mouth 4 (four) times daily as needed (heartburn/ indigestion/ nausea).    [provider]  potassium chloride (KLOR-CON) 20 MEQ packet Take 1 packet by mouth daily. 02/25/16   [provider]  pravastatin (PRAVACHOL) 40 MG tablet Take 40 mg by mouth at bedtime.     [provider]  rOPINIRole (REQUIP) 0.25 MG tablet Take 0.5 mg by  mouth at bedtime. 12/01/15   [provider]  sodium chloride (OCEAN) 0.65 % SOLN nasal spray Place 1 spray into both nostrils as needed for congestion. Patient taking differently: Place 1 spray into both nostrils every hour as needed for congestion (nasal stuffiness).  07/13/14   Jeanella Crazellis, Brandi L, NP    Family History Family History  Problem Relation Age of Onset  . Emphysema Sister        smoked  . Heart disease Sister   . Ovarian cancer Sister     Social History Social History  Substance Use Topics  . Smoking status: Current Every Day Smoker    Packs/day: 1.50    Years: 30.00    Types: Cigarettes  . Smokeless tobacco: Never Used  . Alcohol use No  Allergies   Patient has no known allergies.   Review of Systems Review of Systems  Constitutional: Negative for chills.  Gastrointestinal: Negative for vomiting.  Genitourinary: Positive for dysuria, frequency and urgency. Negative for flank pain.  Musculoskeletal: Positive for myalgias.  Neurological: Negative for weakness.  All other systems reviewed and are negative.    Physical Exam Updated Vital Signs BP (!) 144/78 (BP Location: Right Arm)   Pulse 92   Temp 98 F (36.7 C) (Oral)   Resp 20   SpO2 94%   Physical Exam CONSTITUTIONAL: Well developed/well nourished HEAD: Normocephalic/atraumatic EYES: EOMI/PERRL ENMT: Mucous membranes moist NECK: supple no meningeal signs SPINE/BACK:entire spine nontender CV: S1/S2 noted, no murmurs/rubs/gallops noted LUNGS: Lungs are clear to auscultation bilaterally, no apparent distress ABDOMEN: soft, mild suprapubic tenderness, no rebound or guarding, bowel sounds noted throughout abdomen GU:no cva tenderness NEURO: Pt is awake/alert/appropriate, moves all extremitiesx4.  EXTREMITIES: pulses normal/equal, full ROM SKIN: warm, color normal PSYCH: no abnormalities of mood noted, alert and oriented to situation   ED Treatments / Results  Labs (all labs  ordered are listed, but only abnormal results are displayed) Labs Reviewed  URINALYSIS, ROUTINE W REFLEX MICROSCOPIC - Abnormal; Notable for the following:       Result Value   Specific Gravity, Urine 1.003 (*)    All other components within normal limits    EKG  EKG Interpretation None       Radiology No results found.  Procedures Procedures   EMERGENCY DEPARTMENT ULTRASOUND  Study: Limited Ultrasound of Bladder  INDICATIONS: to assess for urinary retention and/or bladder volume prior to urinary catheter  PERFORMED BY: Myself IMAGES ARCHIVED?: No LIMITATIONS:  none INTERPRETATION: Moderate Volume      Medications Ordered in ED Medications  cephALEXin (KEFLEX) capsule 500 mg (500 mg Oral Given 07/07/16 0354)  phenazopyridine (PYRIDIUM) tablet 100 mg (100 mg Oral Given 07/07/16 0355)     Initial Impression / Assessment and Plan / ED Course  I have reviewed the triage vital signs and the nursing notes.  Pertinent labs  results that were available during my care of the patient were reviewed by me and considered in my medical decision making (see chart for details).     3:53 AM Pt reports previous h/o UTI with similar symptoms Will empirically tx with keflex and pyridium  4:22 AM Pt stable Able to urinate about without difficulty Will start on antibiotics and refer to PCP    Final Clinical Impressions(s) / ED Diagnoses   Final diagnoses:  Dysuria    New Prescriptions New Prescriptions   CEPHALEXIN (KEFLEX) 500 MG CAPSULE    Take 1 capsule (500 mg total) by mouth 2 (two) times daily.   PHENAZOPYRIDINE (PYRIDIUM) 100 MG TABLET    Take 1 tablet (100 mg total) by mouth 2 (two) times daily.     Zadie Rhine, MD 07/07/16 [phone]

## 2016-07-07 NOTE — ED Triage Notes (Signed)
Pt presents with painful urination and urinary frequency and urgency since last PM. Denies fevers N/V. Pt states that sx are similar to previous UTI's

## 2016-07-11 ENCOUNTER — Encounter (HOSPITAL_BASED_OUTPATIENT_CLINIC_OR_DEPARTMENT_OTHER): Payer: Self-pay | Admitting: *Deleted

## 2016-07-11 ENCOUNTER — Emergency Department (HOSPITAL_BASED_OUTPATIENT_CLINIC_OR_DEPARTMENT_OTHER)
Admission: EM | Admit: 2016-07-11 | Discharge: 2016-07-11 | Disposition: A | Discharge status: Home / Self Care | Payer: Medicare Other | Attending: Physician Assistant | Admitting: Physician Assistant

## 2016-07-11 ENCOUNTER — Emergency Department (HOSPITAL_BASED_OUTPATIENT_CLINIC_OR_DEPARTMENT_OTHER): Payer: Medicare Other

## 2016-07-11 DIAGNOSIS — I10 Essential (primary) hypertension: Secondary | ICD-10-CM | POA: Insufficient documentation

## 2016-07-11 DIAGNOSIS — J441 Chronic obstructive pulmonary disease with (acute) exacerbation: Secondary | ICD-10-CM | POA: Insufficient documentation

## 2016-07-11 DIAGNOSIS — J45909 Unspecified asthma, uncomplicated: Secondary | ICD-10-CM | POA: Diagnosis not present

## 2016-07-11 DIAGNOSIS — F17210 Nicotine dependence, cigarettes, uncomplicated: Secondary | ICD-10-CM | POA: Insufficient documentation

## 2016-07-11 DIAGNOSIS — Z79899 Other long term (current) drug therapy: Secondary | ICD-10-CM | POA: Diagnosis not present

## 2016-07-11 DIAGNOSIS — R0602 Shortness of breath: Secondary | ICD-10-CM | POA: Diagnosis present

## 2016-07-11 LAB — COMPREHENSIVE METABOLIC PANEL
ALBUMIN: 4.4 g/dL (ref 3.5–5.0)
ALK PHOS: 88 U/L (ref 38–126)
ALT: 20 U/L (ref 14–54)
ANION GAP: 9 (ref 5–15)
AST: 24 U/L (ref 15–41)
BUN: 12 mg/dL (ref 6–20)
CALCIUM: 9.3 mg/dL (ref 8.9–10.3)
CHLORIDE: 95 mmol/L — AB (ref 101–111)
CO2: 29 mmol/L (ref 22–32)
Creatinine, Ser: 0.59 mg/dL (ref 0.44–1.00)
GFR calc Af Amer: >60 mL/min (ref >60)
GFR calc non Af Amer: >60 mL/min (ref >60)
GLUCOSE: 103 mg/dL — AB (ref 65–99)
Potassium: 4 mmol/L (ref 3.5–5.1)
SODIUM: 133 mmol/L — AB (ref 135–145)
Total Bilirubin: 0.4 mg/dL (ref 0.3–1.2)
Total Protein: 7.5 g/dL (ref 6.5–8.1)

## 2016-07-11 LAB — CBC WITH DIFFERENTIAL/PLATELET
BASOS PCT: 0 %
Basophils Absolute: 0 10*3/uL (ref 0.0–0.1)
EOS ABS: 0 10*3/uL (ref 0.0–0.7)
EOS PCT: 0 %
HCT: 40.7 % (ref 36.0–46.0)
Hemoglobin: 13.6 g/dL (ref 12.0–15.0)
Lymphocytes Relative: 8 %
Lymphs Abs: 0.7 10*3/uL (ref 0.7–4.0)
MCH: 30.6 pg (ref 26.0–34.0)
MCHC: 33.4 g/dL (ref 30.0–36.0)
MCV: 91.5 fL (ref 78.0–100.0)
Monocytes Absolute: 0.7 10*3/uL (ref 0.1–1.0)
Monocytes Relative: 8 %
Neutro Abs: 7.2 10*3/uL (ref 1.7–7.7)
Neutrophils Relative %: 84 %
PLATELETS: 293 10*3/uL (ref 150–400)
RBC: 4.45 MIL/uL (ref 3.87–5.11)
RDW: 13.6 % (ref 11.5–15.5)
WBC: 8.6 10*3/uL (ref 4.0–10.5)

## 2016-07-11 LAB — TROPONIN I: Troponin I: <0.03 ng/mL (ref <0.03)

## 2016-07-11 MED ORDER — PREDNISONE 50 MG PO TABS
60.0000 mg | ORAL_TABLET | Freq: Once | ORAL | Status: AC
  Administered 2016-07-11: 60 mg via ORAL
  Filled 2016-07-11: qty 1

## 2016-07-11 MED ORDER — IPRATROPIUM BROMIDE 0.02 % IN SOLN
0.5000 mg | Freq: Once | RESPIRATORY_TRACT | Status: AC
  Administered 2016-07-11: 0.5 mg via RESPIRATORY_TRACT

## 2016-07-11 MED ORDER — ALBUTEROL (5 MG/ML) CONTINUOUS INHALATION SOLN: 10.0000 mg/h | INHALATION_SOLUTION | RESPIRATORY_TRACT | Status: AC

## 2016-07-11 MED ORDER — ALBUTEROL (5 MG/ML) CONTINUOUS INHALATION SOLN
INHALATION_SOLUTION | RESPIRATORY_TRACT | Status: AC
  Filled 2016-07-11: qty 20

## 2016-07-11 MED ORDER — MAGNESIUM SULFATE 50 % IJ SOLN: 1.0000 g | Freq: Once | INTRAMUSCULAR | Status: DC

## 2016-07-11 MED ORDER — IPRATROPIUM BROMIDE 0.02 % IN SOLN
RESPIRATORY_TRACT | Status: AC
  Filled 2016-07-11: qty 2.5

## 2016-07-11 MED ORDER — SODIUM CHLORIDE 0.9 % IV BOLUS (SEPSIS)
1000.0000 mL | Freq: Once | INTRAVENOUS | Status: AC
  Administered 2016-07-11: 1000 mL via INTRAVENOUS

## 2016-07-11 MED ORDER — PREDNISONE 20 MG PO TABS: ORAL_TABLET | ORAL | 0 refills | Status: DC

## 2016-07-11 MED ORDER — MAGNESIUM SULFATE IN D5W 1-5 GM/100ML-% IV SOLN
1.0000 g | Freq: Once | INTRAVENOUS | Status: AC
  Administered 2016-07-11: 1 g via INTRAVENOUS
  Filled 2016-07-11: qty 100

## 2016-07-11 MED ORDER — IPRATROPIUM-ALBUTEROL 0.5-2.5 (3) MG/3ML IN SOLN: 3.0000 mL | Freq: Once | RESPIRATORY_TRACT | Status: DC

## 2016-07-11 MED ORDER — ALBUTEROL SULFATE HFA 108 (90 BASE) MCG/ACT IN AERS
2.0000 | INHALATION_SPRAY | Freq: Once | RESPIRATORY_TRACT | Status: AC
  Administered 2016-07-11: 2 via RESPIRATORY_TRACT
  Filled 2016-07-11: qty 6.7

## 2016-07-11 NOTE — ED Notes (Signed)
Patient transported to X-ray 

## 2016-07-11 NOTE — ED Triage Notes (Signed)
Pt c/o increased SOB today. Called her PCP and was advised to come to ED. Pt recently finished a prednisone taper.

## 2016-07-11 NOTE — Discharge Instructions (Signed)
Please return as needed if your shortness of breath gets worse.

## 2016-07-11 NOTE — ED Provider Notes (Signed)
[facility]   CSN: [id number] Arrival date & time: 07/11/16  1633     History   Chief Complaint Chief Complaint  Patient presents with  . Shortness of Breath    HPI [name] is a 66 y.o. female.  HPI   Patient is a 66 year old female presenting with shortness of breath. Patient had this for last several weeks. Patient smokes one to 2 packs cigarettes a day. Patient has history of COPD. Patient is been worse the last week with increased sputum production. Patient has no chest pain. Patient has noticed no fever, or other infectious symptoms. No recent travel.  Past Medical History:  Diagnosis Date  . Asthma   . Bronchitis   . COPD (chronic obstructive pulmonary disease) (HCC)   . Emphysema   . Encephalopathy 06/2014  . Hip fx, right, closed, initial encounter (HCC)   . Hyperlipidemia   . Hypertension   . Shortness of breath dyspnea   . UTI (urinary tract infection)     Patient Active Problem List   Diagnosis Date Noted  . Acute on chronic respiratory failure with hypoxia and hypercapnia (HCC) 02/28/2016  . Influenza 02/27/2016  . S/P ORIF (open reduction internal fixation) fracture   . Chronic pain syndrome   . Acute blood loss anemia   . Hip fx, right, closed, initial encounter (HCC) 12/05/2015  . Pressure ulcer 07/07/2014  . Altered mental state   . Change in mental state   . Fever   . Acute encephalopathy 07/06/2014  . Narcotic overdose   . Hyponatremia 05/31/2014  . Lethargic 05/31/2014  . Chronic pain 05/31/2014  . Sepsis (HCC) 05/31/2014  . COPD GOLD III/ still smoking  04/02/2014  . Chronic respiratory failure with hypercapnia (HCC) 04/02/2014  . Cigarette smoker 04/02/2014  . COPD exacerbation (HCC) 02/12/2012  . Essential hypertension 02/12/2012  . Tachycardia 02/12/2012    Past Surgical History:  Procedure Laterality Date  . ABDOMINAL HYSTERECTOMY    . APPENDECTOMY    . BACK SURGERY    . HIP ARTHROPLASTY Right  12/05/2015   Procedure: ARTHROPLASTY BIPOLAR HIP (HEMIARTHROPLASTY);  Surgeon: Teryl Lucy, MD;  Location: [facility];  Service: Orthopedics;  Laterality: Right;  . INSERTION / PLACEMENT / REVISION NEUROSTIMULATOR    . removal of neurostimulator  06/2014    OB History    No data available       Home Medications    Prior to Admission medications   Medication Sig Start Date End Date Taking? Authorizing Provider  acetaminophen (TYLENOL) 500 MG tablet Take 500 mg by mouth every 6 (six) hours as needed (pain).    [provider]  albuterol (PROAIR HFA) 108 (90 BASE) MCG/ACT inhaler 2 puffs every 4 hours as needed only  if your can't catch your breath Patient taking differently: 2 puffs every 4 hours as needed for wheezing and shortness of breath ((PLAN B)) 04/02/14   Nyoka Cowden, MD  albuterol (PROVENTIL) (2.5 MG/3ML) 0.083% nebulizer solution Take 3 mLs (2.5 mg total) by nebulization every 6 (six) hours as needed for wheezing or shortness of breath. Patient taking differently: Take 2.5 mg by nebulization every 4 (four) hours as needed for wheezing or shortness of breath (((PLAN C))).  04/02/14   Nyoka Cowden, MD  amLODipine (NORVASC) 5 MG tablet Take 5 mg by mouth daily. 02/09/16   [provider]  aspirin EC 81 MG tablet Take 81 mg by mouth at bedtime.  [provider]  baclofen (LIORESAL) 10 MG tablet Take 10 mg by mouth at bedtime as needed for muscle spasms.    [provider]  budesonide-formoterol (SYMBICORT) 160-4.5 MCG/ACT inhaler Inhale 2 puffs into the lungs 2 (two) times daily as needed (shortness of breath/ wheezing).     [provider]  cephALEXin (KEFLEX) 500 MG capsule Take 1 capsule (500 mg total) by mouth 2 (two) times daily. 07/07/16   Zadie Rhine, MD  citalopram (CELEXA) 20 MG tablet Take 20 mg by mouth at bedtime.  08/12/14   [provider]  cloNIDine (CATAPRES) 0.1 MG tablet Take 1 tablet (0.1 mg total) by mouth 2  (two) times daily. 07/13/14   Jeanella Craze, NP  diclofenac (VOLTAREN) 75 MG EC tablet Take 1 tablet (75 mg total) by mouth 2 (two) times daily. Patient taking differently: Take 75 mg by mouth 2 (two) times daily as needed. Take with food 09/05/15   Dione Booze, MD  ferrous sulfate 325 (65 FE) MG tablet Take 1 tablet (325 mg total) by mouth 3 (three) times daily after meals. Patient taking differently: Take 325 mg by mouth daily with breakfast.  12/08/15   Vassie Loll, MD  gabapentin (NEURONTIN) 300 MG capsule Take 300-600 mg by mouth See admin instructions. Take 1 capsule (300 mg) by mouth every morning and 2 capsules (600 mg) at bedtime 11/18/15   [provider]  MAGNESIUM-ZINC PO Take 1 tablet by mouth at bedtime.    [provider]  Multiple Vitamin (MULTIVITAMIN WITH MINERALS) TABS Take 1 tablet by mouth at bedtime.     [provider]  oxyCODONE (OXYCONTIN) 10 mg 12 hr tablet Take 1 tablet (10 mg total) by mouth every 12 (twelve) hours. Patient taking differently: Take 15 mg by mouth every 12 (twelve) hours.  12/08/15   Vassie Loll, MD  OXYGEN Inhale 2 L into the lungs See admin instructions. Wear 2L at bedtime and with activity as needed for shortness of breath     [provider]  phenazopyridine (PYRIDIUM) 100 MG tablet Take 1 tablet (100 mg total) by mouth 2 (two) times daily. 07/07/16   Zadie Rhine, MD  Phenyleph-Doxylamine-DM-APAP (ALKA SELTZER PLUS PO) Take 1 tablet by mouth 4 (four) times daily as needed (heartburn/ indigestion/ nausea).    [provider]  potassium chloride (KLOR-CON) 20 MEQ packet Take 1 packet by mouth daily. 02/25/16   [provider]  pravastatin (PRAVACHOL) 40 MG tablet Take 40 mg by mouth at bedtime.     [provider]  rOPINIRole (REQUIP) 0.25 MG tablet Take 0.5 mg by mouth at bedtime. 12/01/15   [provider]  sodium chloride (OCEAN) 0.65 % SOLN nasal spray Place 1 spray into  both nostrils as needed for congestion. Patient taking differently: Place 1 spray into both nostrils every hour as needed for congestion (nasal stuffiness).  07/13/14   Jeanella Craze, NP    Family History Family History  Problem Relation Age of Onset  . Emphysema Sister        smoked  . Heart disease Sister   . Ovarian cancer Sister     Social History Social History  Substance Use Topics  . Smoking status: Current Every Day Smoker    Packs/day: 2.00    Years: 30.00    Types: Cigarettes  . Smokeless tobacco: Never Used  . Alcohol use No     Allergies   Patient has no known allergies.  Review of Systems Review of Systems  Constitutional: Positive for fatigue. Negative for activity change and fever.  Respiratory: Positive for cough, chest tightness and shortness of breath.   Cardiovascular: Negative for chest pain.  Gastrointestinal: Negative for abdominal pain.     Physical Exam Updated Vital Signs BP (!) 165/77 (BP Location: Right Arm)   Pulse (!) 103   Temp 97.8 F (36.6 C) (Oral)   Resp (!) 22   Ht 5\' 7"  (1.702 m)   Wt 68 kg (150 lb)   SpO2 95%   BMI 23.49 kg/m   Physical Exam  Constitutional: She is oriented to person, place, and time. She appears well-developed and well-nourished.  HENT:  Head: Normocephalic and atraumatic.  Eyes: Right eye exhibits no discharge.  Cardiovascular: Regular rhythm and normal heart sounds.   No murmur heard. tachycardia  Pulmonary/Chest: She has wheezes. She has rales.  tachypnea  Abdominal: Soft. She exhibits no distension. There is no tenderness.  Neurological: She is oriented to person, place, and time.  Skin: Skin is warm and dry. She is not diaphoretic.  Psychiatric: She has a normal mood and affect.  Nursing note and vitals reviewed.    ED Treatments / Results  Labs (all labs ordered are listed, but only abnormal results are displayed) Labs Reviewed  CBC WITH DIFFERENTIAL/PLATELET  COMPREHENSIVE  METABOLIC PANEL  TROPONIN I    EKG  EKG Interpretation None       Radiology No results found.  Procedures Procedures (including critical care time)  Medications Ordered in ED Medications  ipratropium-albuterol (DUONEB) 0.5-2.5 (3) MG/3ML nebulizer solution 3 mL (not administered)  predniSONE (DELTASONE) tablet 60 mg (not administered)     Initial Impression / Assessment and Plan / ED Course  I have reviewed the triage vital signs and the nursing notes.  Pertinent labs & imaging results that were available during my care of the patient were reviewed by me and considered in my medical decision making (see chart for details).    Patient is a chronically ill 66 year old female presenting with shortness of breath. Patient likely having COPD exacerbation. We'll treat with prednisone, do nebs. Patient not hypoxic but does have tachypnea and tachycardia.  Labs reassuring.  Still mild wheezes after CAT.   Gave fluids and Mag.Pt feels much improved.  Has 2 L home o2 for around the house. Here she is sating around 90 percent on room air.  No longer and tachypnea.  Will have her continue steroids. Gave her albeterol inhalor to have (along with the nebulizers she has at home).  Patient is comfortable, ambulatory, and taking PO at time of discharge.  Patient expressed understanding about return precautions.     Final Clinical Impressions(s) / ED Diagnoses   Final diagnoses:  None    New Prescriptions New Prescriptions   No medications on file     Abelino DerrickMackuen, Courteney Lyn, MD 07/11/16 2235

## 2016-07-28 ENCOUNTER — Ambulatory Visit: Payer: Medicare Other | Admitting: Internal Medicine

## 2016-08-07 ENCOUNTER — Ambulatory Visit: Payer: Medicare Other | Admitting: Internal Medicine

## 2016-09-12 ENCOUNTER — Ambulatory Visit (INDEPENDENT_AMBULATORY_CARE_PROVIDER_SITE_OTHER): Payer: Medicare Other | Admitting: Internal Medicine

## 2016-09-12 ENCOUNTER — Encounter: Payer: Self-pay | Admitting: Internal Medicine

## 2016-09-12 VITALS — BP 130/70 | HR 91 | Ht 67.0 in | Wt 153.0 lb

## 2016-09-12 DIAGNOSIS — J9612 Chronic respiratory failure with hypercapnia: Secondary | ICD-10-CM

## 2016-09-12 DIAGNOSIS — J449 Chronic obstructive pulmonary disease, unspecified: Secondary | ICD-10-CM

## 2016-09-12 DIAGNOSIS — F17210 Nicotine dependence, cigarettes, uncomplicated: Secondary | ICD-10-CM

## 2016-09-12 MED ORDER — TIOTROPIUM BROMIDE MONOHYDRATE 2.5 MCG/ACT IN AERS: 2.0000 | INHALATION_SPRAY | Freq: Every day | RESPIRATORY_TRACT | 11 refills | Status: AC

## 2016-09-12 MED ORDER — TIOTROPIUM BROMIDE MONOHYDRATE 2.5 MCG/ACT IN AERS: 2.0000 | INHALATION_SPRAY | Freq: Every day | RESPIRATORY_TRACT | 0 refills | Status: DC

## 2016-09-12 MED ORDER — BUDESONIDE-FORMOTEROL FUMARATE 160-4.5 MCG/ACT IN AERO: 2.0000 | INHALATION_SPRAY | Freq: Two times a day (BID) | RESPIRATORY_TRACT | 0 refills | Status: DC

## 2016-09-12 NOTE — Progress Notes (Addendum)
Subjective:   Patient ID: [name], female    DOB: Jan 12, 1951     MRN: [id number]    Brief patient profile:  66 yowf long smoking hx ? Asthma as child? But fine in HS onset of variable sob x 2010 on noct 02  And maint spiriva and symbicort  then much worse since Thx 2/015 referred by friend to   pulmonary clinic 04/02/2014 and did not return for pfts then required intubation 06/2014  > dx as COPD GOLD III criteria 06/10/2015     History of Present Illness  04/02/2014 1st [facility] office visit/ Addisson Frate  / on ACEi  Chief Complaint  Patient presents with  . Pulmonary Consult    Self referral. Pt c/o cough and SOB for the past 2 months. Her cough is prod with large amounts of yellow to green sputum.  She states feels SOB all of the time, but worse with any exertion. Her sats were 78% ra at rest--increased to 92%2lpm continuous o2.    Just finished cipro / typically much better in short run anyway p prednisone but not this most last flare. Sob x across the room.  Onset was abrupt at [place], pattern is variable but overall declining in terms of best days >>For now stay on 02 2lpm=  24/7 Prednisone 10 mg Take 4 for three days 3 for three days 2 for three days 1 for three days and stop  Stop lisinopril and start benicar 20 mg one half daily until return and your breathing should gradually improve  For cough > mucinex dm 1200 mg every 12 hours and supplement with tramadol 50mg  1 every 4 hours Plan A =  Automatic = symbicort 160 Take 2 puffs first thing in am and then another 2 puffs about 12 hours later.                                       spriva each am only Plan B = backup Only use your albuterol (proair)as a rescue medication Plan C = crisis = albuterol 2.5 every 4 hours if needed if plan B doesn't work (ok to use up your present neb solution  if you want)    04/16/2014 NP Follow up : Cough/ACE and Med Calendar Pt returns for a 2 week follow up and med review  Seen last ov for  Cough and Dypsnea.  Taken off ACE  Seen back in office for acute office visit on 3/3 -tx for bronchitis -tx w/ augmentin   Cough is much better, decreased congestion  Feels better . Swelling is better.  Does complain of left ear pain.  We reviewed all her medications and appears to be taking correctly.  System was down unable to complete med calendar  rec Continue on Symbicort and Spiriva . -rinse after use.  Mucinex DM Twice daily  As needed  Cough/congestion  Great job on not smoking.  Continue  On Benicar 20mg  1/2 daily .  Follow med calendar closely and bring to each visit.  follow up Dr. Sherene Sires  In 4 weeks with PFT and As needed  > did not return     08/14/2014 Follow up : Presumed COPD-PFT pending  Returns for follow up  Last ov with slow to resolve AECOPD . tx w/ pred taper  She is feeling better.    Pt states breathing is doing well and has  no new complaints.  reveiwed all her meds and organized them into a med calendar with pt education  Appears to be correct.  Denies chest pain , orthopnea, edema or fever rec Continue on Symbicort and Spiriva . -rinse after use.  Mucinex DM Twice daily  As needed  Cough/congestion  Work on on not smoking.   Follow med calendar closely and bring to each visit.  follow up Dr. Sherene Sires  In 4 weeks with PFT and As needed > did not return     06/10/2015 ext f/u ov/Vasil Juhasz re:  Copd GOLD III  Symb/still smoking   Chief Complaint  Patient presents with  . Follow-up    Wants to discuss smoking cessation- ? chantix. Her breathing is doing well. She states has had a cold for the past wk- prod with yellow sputum. She has been using proair 3 x daily on average and has not used neb.    more limited by back than by breathing at this point but wants POC [place] / following [facility] plan for copd rx but not using med calendar provided last ov  rec zpak Prednisone 10 mg take  4 each am x 2 days,   2 each am x 2 days,  1 each am x 2 days  and stop  Plan A = Automatic = Symbicort 160 Take 2 puffs first thing in am and then another 2 puffs about 12 hours later and try off spiriva  Plan B = Backup Only use your albuterol as a rescue medication  Plan C = Crisis - only use your albuterol nebulizer if you first try Plan B  Start chantix and stop smoking completely in 7 days and follow up with Dr Doristine Counter before you run out of chantix  Please see patient coordinator before you leave today  to schedule best fit for portable 02 to see if eligible for portable concentrator     Date of Admission: 02/27/2016   Date of Discharge: 03/03/2016 Attending Physician: No att. providers found  Discharge Diagnosis: 1. Altered Mental Status 2. Acute on Chronic Respiratory Failure with Hypoxia and Hypercapnia  Principal Problem:   Acute on chronic respiratory failure with hypoxia and hypercapnia (HCC) Active Problems:   COPD GOLD III/ still smoking    Cigarette smoker   Hyponatremia   Acute encephalopathy   Chronic pain syndrome   Influenza     09/12/2016  f/u ov/Jaecion Dempster re:  Re-establish care post admit GOLD III/ symb 160 / still smoking  Chief Complaint  Patient presents with  . Pulmonary Consult    Last seen May 2017 and was referred back by Elizabeth Palau, NP. She states that she was hospitalized July 2018 for COPD flare. She stats today is an okay day for her breathing. She is using Duoneb and albuterol inhaler both 2-3 x per day on average.   02 2.5 lpm hs/ has it for activity but not using  When takes prednisone not really much improved sob but does note reduced saba dependency  Doe = MMRC3 = can't walk 100 yards even at a slow pace at a flat grade s stopping due to sob  /back limiting  No obvious day to day or daytime variability or assoc excess/ purulent sputum or mucus plugs or hemoptysis or cp or chest tightness, subjective wheeze or overt sinus or hb symptoms. No unusual exp hx or h/o childhood pna/ ? Asthma  or knowledge of  premature birth.  Sleeping ok  without nocturnal  or early am exacerbation  of respiratory  c/o's or need for noct saba. Also denies any obvious fluctuation of symptoms with weather or environmental changes or other aggravating or alleviating factors except as outlined above   Current Medications, Allergies, Complete Past Medical History, Past Surgical History, Family History, and Social History were reviewed in [facility] electronic medical record.  ROS  The following are not active complaints unless bolded sore throat, dysphagia, dental problems, itching, sneezing,  nasal congestion or excess/ purulent secretions, ear ache,   fever, chills, sweats, unintended wt loss, classically pleuritic or exertional cp,  orthopnea pnd or leg swelling, presyncope, palpitations, abdominal pain, anorexia, nausea, vomiting, diarrhea  or change in bowel or bladder habits, change in stools or urine, dysuria,hematuria,  rash, arthralgias, visual complaints, headache, numbness, weakness or ataxia or problems with walking or coordination,  change in mood/affect or memory.                 Objective:   Physical Exam    Tremulous amb wf nad  09/12/2016          153   06/10/15 135 lb 9.6 oz (61.508 kg)  08/14/14 160 lb (72.576 kg)  07/20/14 160 lb (72.576 kg)    Vital signs reviewed   - Note on arrival 02 sats  94% on RA       HEENT: full dentures, turbinates, and orophanx. Nl external ear canals without cough reflex   NECK :  without JVD/Nodes/TM/ nl carotid upstrokes bilaterally   LUNGS: slt barrel chest, distant bs with faint mid/late  exp rhonchi bilaterally    CV:  RRR  no s3 or murmur or increase in P2, no edema   ABD:  soft and nontender with nl excursion in the supine position. No bruits or organomegaly, bowel sounds nl  MS:  warm without deformities, calf tenderness, cyanosis or clubbing  SKIN: warm and dry without lesions    NEURO:  alert, approp, no deficits/ resting tremor s  rigidity         I personally reviewed images and agree with radiology impression as follows:  CXR:   07/11/16 Minimal atelectasis at the left base.  No infiltrate or edema.          Assessment & Plan:

## 2016-09-12 NOTE — Patient Instructions (Addendum)
Adjust your 02  To maintain your oxygen level at > 90% while walking  Plan A = Automatic = symbicort 160 x 2 puffs and spiriva x 2 puffs then 12 hours just the symbicort x 2puffs   Work on inhaler technique:  relax and gently blow all the way out then take a nice smooth deep breath back in, triggering the inhaler at same time you start breathing in.  Hold for up to 5 seconds if you can. Blow out thru nose. Rinse and gargle with water when done    Plan B = Backup Only use your albuterol as a rescue medication to be used if you can't catch your breath by resting or doing a relaxed purse lip breathing pattern.  - The less you use it, the better it will work when you need it. - Ok to use the inhaler up to 2 puffs  every 4 hours if you must but call for appointment if use goes up over your usual need - Don't leave home without it !!  (think of it like the spare tire for your car)   Plan C = Crisis - only use your albuterol nebulizer if you first try Plan B and it fails to help > ok to use the nebulizer up to every 4 hours but if start needing it regularly call for immediate appointment   The key is to stop smoking completely before smoking completely stops you!    Please schedule a follow up office visit in 6 weeks, call sooner if needed with pfts on return

## 2016-09-14 NOTE — Assessment & Plan Note (Signed)
-   trial off acei 04/02/2014  -  07/20/14 flutter added  -med calendar 08/14/14 > not using 06/10/2015 - Spirometry 06/10/2015  FEV1 1.04 (45%)  Ratio 67  - 09/12/2016  After extensive coaching device  effectiveness =    75% with hfa and smi (Ti too short) > try add spiriva respimat    She is probably still a GOLD III but symptoms more of a group B so needs laba/lama and may not really need the ICS though with active smoking AB component still likely so plan to leave it on board for now and work hard on stopping smoking (see separate a/p)   I had an extended discussion with the patient reviewing all relevant studies completed to date and  lasting 15 to 20 minutes of a 25 minute visit    Each maintenance medication was reviewed in detail including most importantly the difference between maintenance and prns and under what circumstances the prns are to be triggered using an action plan format that is not reflected in the computer generated alphabetically organized AVS.    Please see AVS for specific instructions unique to this visit that I personally wrote and verbalized to the the pt in detail and then reviewed with pt  by my nurse highlighting any  changes in therapy recommended at today's visit to their plan of care.

## 2016-09-14 NOTE — Assessment & Plan Note (Signed)
>   3 min Discussed the risks and costs (both direct and indirect)  of smoking relative to the benefits of quitting but patient unwilling to commit at this point to a specific quit date.    Although I don't endorse regular use of e cigs/ many pts find them helpful; however, I emphasized they should be considered a "one-way bridge" off all tobacco products.  

## 2016-09-14 NOTE — Assessment & Plan Note (Signed)
02 sats at rest 78% RA 04/02/2014  Top 92 % on 2lpm - HC03  35 on 07/11/14  - 07/20/2014   Walked RA x one lap @ 185 stopped due to  Sob/ no desats - 06/10/2015  Walked RA x 3 laps @ 185 ft each stopped due to  desat to 88% improved on 2lpm  so is qualified for portable 02 > referred for POC eval    rec as of 09/12/2016  use 2lpm at hs and prn daytime @ 2lpm  With activity

## 2016-10-27 ENCOUNTER — Other Ambulatory Visit: Payer: Self-pay | Admitting: Internal Medicine

## 2016-10-27 DIAGNOSIS — R0600 Dyspnea, unspecified: Secondary | ICD-10-CM

## 2016-10-30 ENCOUNTER — Ambulatory Visit: Payer: Medicare Other | Admitting: Internal Medicine

## 2017-09-11 ENCOUNTER — Other Ambulatory Visit: Payer: Self-pay | Admitting: Internal Medicine

## 2018-06-08 IMAGING — CR DG HIP (WITH OR WITHOUT PELVIS) 1V PORT*R*
2 series · 2 of 2 positions shown · non-contrast
Comparison: None.

CLINICAL DATA: Status post internal fixation of right hip fracture.
Initial encounter.

EXAM:
DG HIP (WITH OR WITHOUT PELVIS) 1V PORT RIGHT

[AP (1 of 2)]
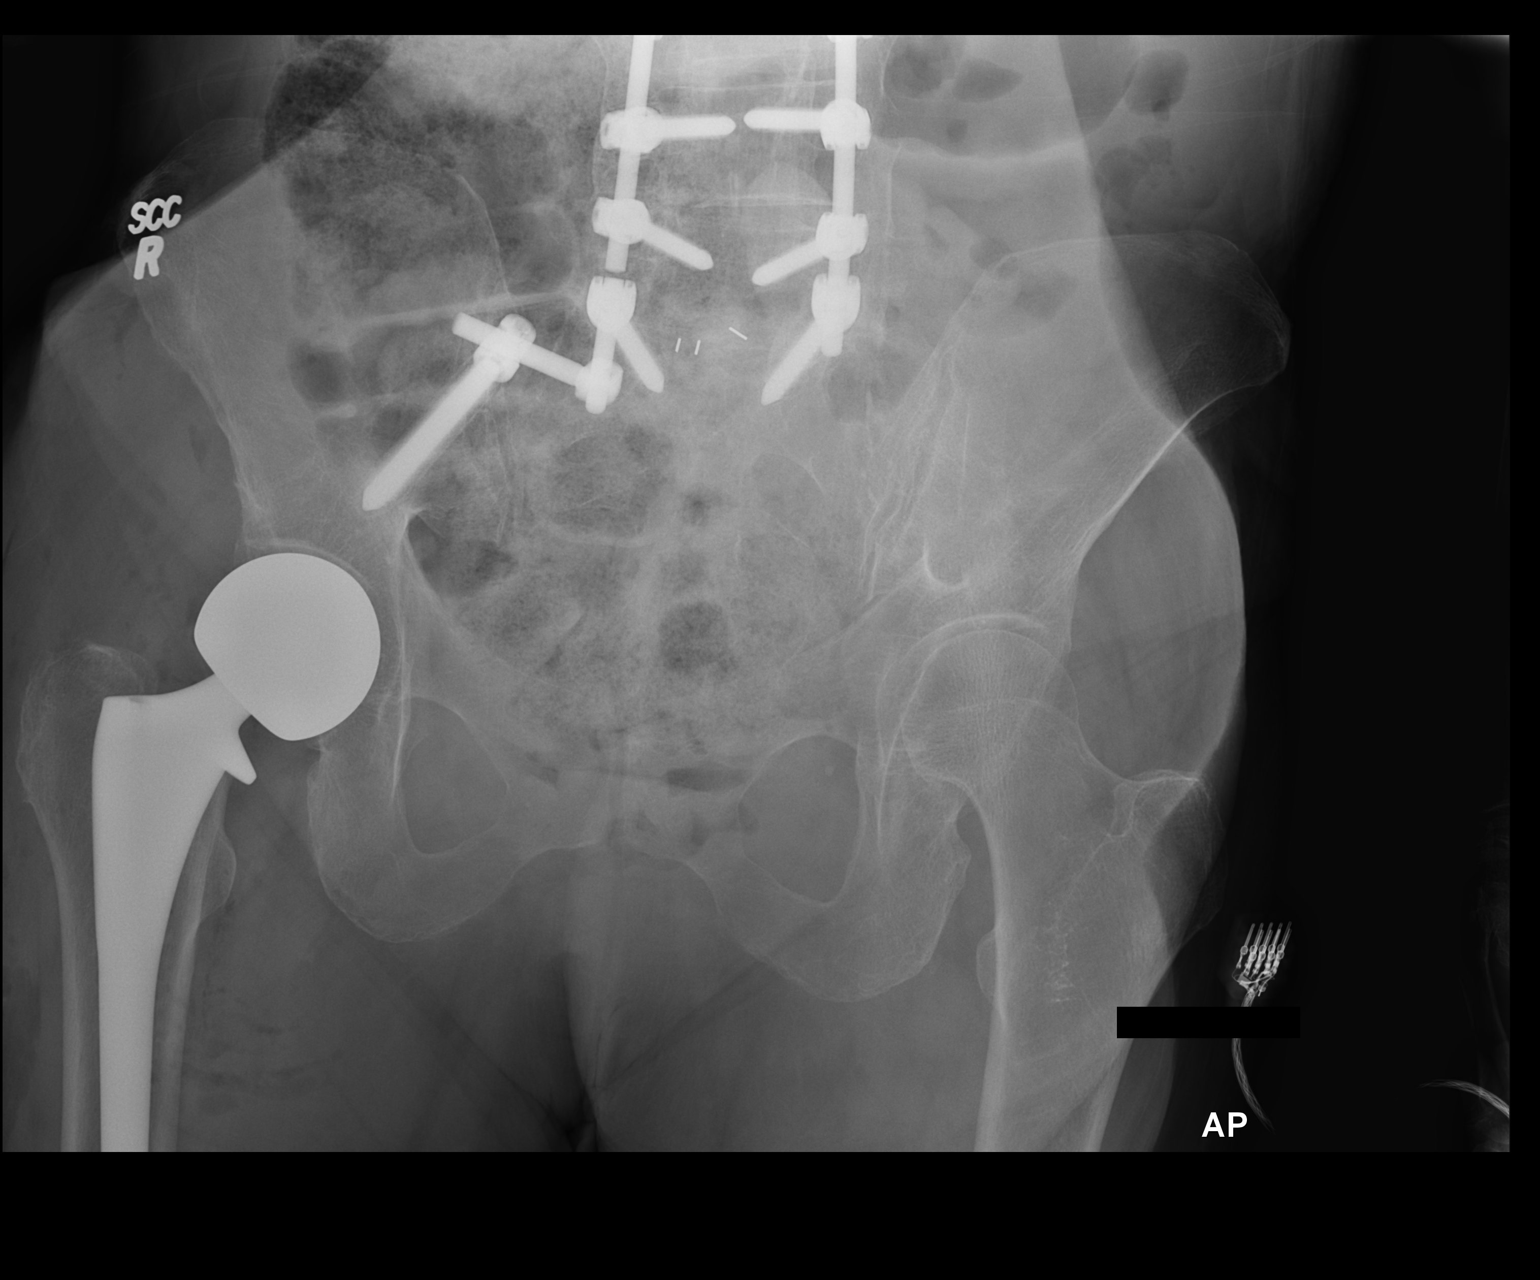

[AP (2 of 2)]
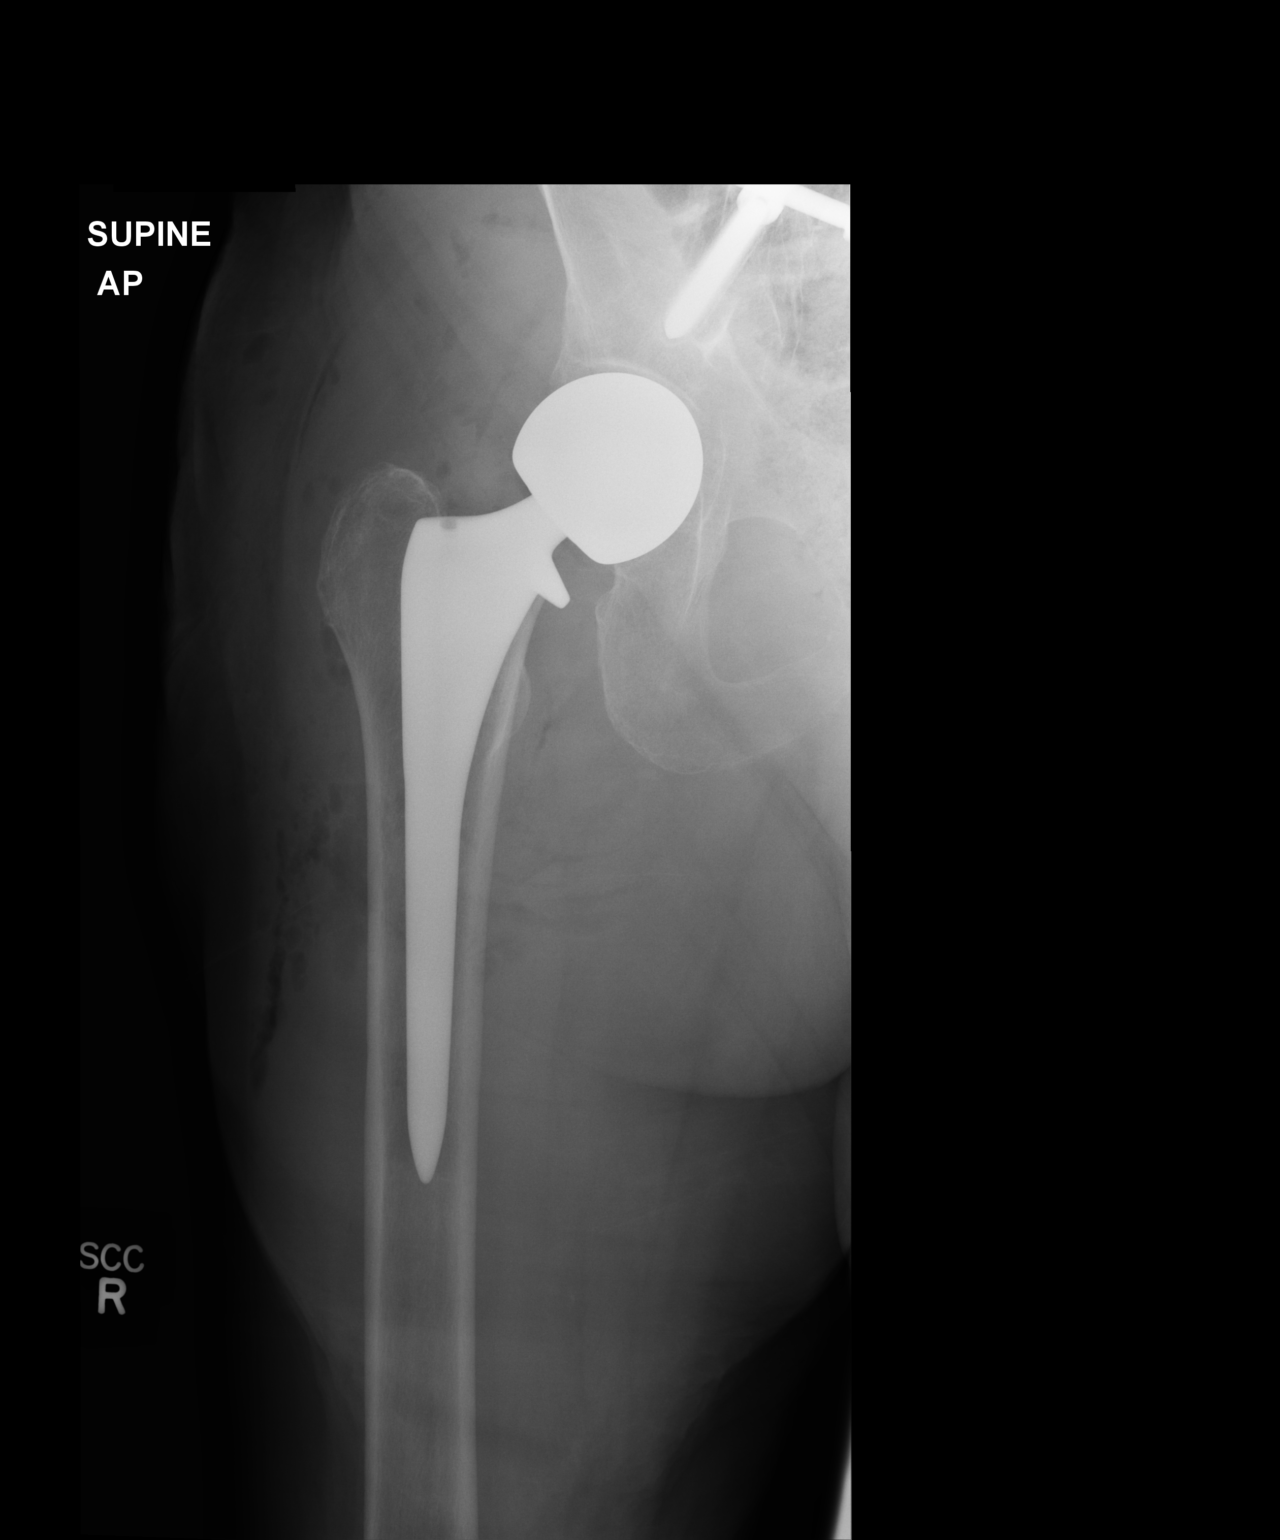

[2 of 2 positions shown; findings below may reference images not displayed]

FINDINGS: The patient's right hip arthroplasty appears grossly intact, without
evidence of loosening or new fracture. The left hip joint is
unremarkable in appearance. Lumbosacral spinal fusion hardware
appears to demonstrate a chronic fracture on the right side, at
L4-L5, unchanged from 8667. There is fusion of the right sacroiliac
joint.

Postoperative soft tissue air is seen overlying the right hip.
IMPRESSION: Status post right hip arthroplasty. Prosthesis appears grossly
intact, without evidence of loosening or new fracture.

## 2020-08-06 DEATH — deceased
